# Patient Record
Sex: Male | Born: 1960
Health system: Southern US, Community
[De-identification: ages and names within clinical notes are randomized; demographics above are authoritative.]

## PROBLEM LIST (undated history)

## (undated) DIAGNOSIS — F1410 Cocaine abuse, uncomplicated: Secondary | ICD-10-CM

## (undated) DIAGNOSIS — G8929 Other chronic pain: Secondary | ICD-10-CM

## (undated) DIAGNOSIS — K279 Peptic ulcer, site unspecified, unspecified as acute or chronic, without hemorrhage or perforation: Secondary | ICD-10-CM

## (undated) DIAGNOSIS — E876 Hypokalemia: Secondary | ICD-10-CM

## (undated) DIAGNOSIS — F17200 Nicotine dependence, unspecified, uncomplicated: Secondary | ICD-10-CM

## (undated) DIAGNOSIS — E785 Hyperlipidemia, unspecified: Secondary | ICD-10-CM

## (undated) DIAGNOSIS — I10 Essential (primary) hypertension: Secondary | ICD-10-CM

## (undated) DIAGNOSIS — D72829 Elevated white blood cell count, unspecified: Secondary | ICD-10-CM

## (undated) DIAGNOSIS — I2510 Atherosclerotic heart disease of native coronary artery without angina pectoris: Secondary | ICD-10-CM

## (undated) DIAGNOSIS — K219 Gastro-esophageal reflux disease without esophagitis: Secondary | ICD-10-CM

## (undated) DIAGNOSIS — I739 Peripheral vascular disease, unspecified: Secondary | ICD-10-CM

## (undated) DIAGNOSIS — K449 Diaphragmatic hernia without obstruction or gangrene: Secondary | ICD-10-CM

## (undated) HISTORY — DX: Hypokalemia: E87.6

## (undated) HISTORY — DX: Elevated white blood cell count, unspecified: D72.829

## (undated) HISTORY — DX: Peptic ulcer, site unspecified, unspecified as acute or chronic, without hemorrhage or perforation: K27.9

## (undated) HISTORY — DX: Other chronic pain: G89.29

## (undated) HISTORY — PX: CORONARY STENT PLACEMENT: SHX1402

## (undated) HISTORY — DX: Diaphragmatic hernia without obstruction or gangrene: K44.9

---

## 2012-09-20 ENCOUNTER — Emergency Department (HOSPITAL_COMMUNITY): Payer: Medicare Other

## 2012-09-20 ENCOUNTER — Observation Stay (HOSPITAL_COMMUNITY)
Admission: EM | Admit: 2012-09-20 | Discharge: 2012-09-24 | DRG: 287 | Disposition: A | Discharge status: Home / Self Care | Payer: Medicare Other | Attending: Internal Medicine | Admitting: Internal Medicine

## 2012-09-20 ENCOUNTER — Encounter (HOSPITAL_COMMUNITY): Payer: Self-pay | Admitting: Emergency Medicine

## 2012-09-20 DIAGNOSIS — F17200 Nicotine dependence, unspecified, uncomplicated: Secondary | ICD-10-CM

## 2012-09-20 DIAGNOSIS — E785 Hyperlipidemia, unspecified: Secondary | ICD-10-CM | POA: Diagnosis present

## 2012-09-20 DIAGNOSIS — E876 Hypokalemia: Secondary | ICD-10-CM | POA: Diagnosis present

## 2012-09-20 DIAGNOSIS — I5020 Unspecified systolic (congestive) heart failure: Secondary | ICD-10-CM | POA: Diagnosis present

## 2012-09-20 DIAGNOSIS — H5340 Unspecified visual field defects: Secondary | ICD-10-CM | POA: Diagnosis present

## 2012-09-20 DIAGNOSIS — M79609 Pain in unspecified limb: Secondary | ICD-10-CM

## 2012-09-20 DIAGNOSIS — R079 Chest pain, unspecified: Principal | ICD-10-CM | POA: Diagnosis present

## 2012-09-20 DIAGNOSIS — I10 Essential (primary) hypertension: Secondary | ICD-10-CM | POA: Diagnosis present

## 2012-09-20 DIAGNOSIS — Z9861 Coronary angioplasty status: Secondary | ICD-10-CM

## 2012-09-20 DIAGNOSIS — F1410 Cocaine abuse, uncomplicated: Secondary | ICD-10-CM

## 2012-09-20 DIAGNOSIS — H348192 Central retinal vein occlusion, unspecified eye, stable: Secondary | ICD-10-CM

## 2012-09-20 DIAGNOSIS — Z8249 Family history of ischemic heart disease and other diseases of the circulatory system: Secondary | ICD-10-CM

## 2012-09-20 DIAGNOSIS — K5900 Constipation, unspecified: Secondary | ICD-10-CM

## 2012-09-20 DIAGNOSIS — Z7982 Long term (current) use of aspirin: Secondary | ICD-10-CM

## 2012-09-20 DIAGNOSIS — I739 Peripheral vascular disease, unspecified: Secondary | ICD-10-CM

## 2012-09-20 DIAGNOSIS — H349 Unspecified retinal vascular occlusion: Secondary | ICD-10-CM | POA: Diagnosis present

## 2012-09-20 DIAGNOSIS — I2510 Atherosclerotic heart disease of native coronary artery without angina pectoris: Secondary | ICD-10-CM | POA: Diagnosis present

## 2012-09-20 DIAGNOSIS — I509 Heart failure, unspecified: Secondary | ICD-10-CM | POA: Diagnosis present

## 2012-09-20 HISTORY — DX: Atherosclerotic heart disease of native coronary artery without angina pectoris: I25.10

## 2012-09-20 HISTORY — DX: Hyperlipidemia, unspecified: E78.5

## 2012-09-20 HISTORY — DX: Nicotine dependence, unspecified, uncomplicated: F17.200

## 2012-09-20 HISTORY — DX: Peripheral vascular disease, unspecified: I73.9

## 2012-09-20 HISTORY — DX: Cocaine abuse, uncomplicated: F14.10

## 2012-09-20 HISTORY — DX: Gastro-esophageal reflux disease without esophagitis: K21.9

## 2012-09-20 HISTORY — DX: Essential (primary) hypertension: I10

## 2012-09-20 LAB — CBC WITH DIFFERENTIAL/PLATELET
Basophils Absolute: 0 10*3/uL (ref 0.0–0.1)
Basophils Relative: 1 % (ref 0–1)
Eosinophils Absolute: 0.4 10*3/uL (ref 0.0–0.7)
Eosinophils Relative: 5 % (ref 0–5)
HCT: 37.9 % — ABNORMAL LOW (ref 39.0–52.0)
MCH: 29.9 pg (ref 26.0–34.0)
MCHC: 34.6 g/dL (ref 30.0–36.0)
MCV: 86.5 fL (ref 78.0–100.0)
Monocytes Absolute: 0.5 10*3/uL (ref 0.1–1.0)
Platelets: 245 10*3/uL (ref 150–400)
RDW: 12.7 % (ref 11.5–15.5)
WBC: 8.3 10*3/uL (ref 4.0–10.5)

## 2012-09-20 LAB — URINALYSIS, ROUTINE W REFLEX MICROSCOPIC
Bilirubin Urine: NEGATIVE
Glucose, UA: NEGATIVE mg/dL
Hgb urine dipstick: NEGATIVE
Nitrite: NEGATIVE
Specific Gravity, Urine: 1.01 (ref 1.005–1.030)
pH: 7.5 (ref 5.0–8.0)

## 2012-09-20 LAB — CBC
HCT: 35.6 % — ABNORMAL LOW (ref 39.0–52.0)
MCHC: 33.7 g/dL (ref 30.0–36.0)
MCV: 86.8 fL (ref 78.0–100.0)
RDW: 12.8 % (ref 11.5–15.5)

## 2012-09-20 LAB — COMPREHENSIVE METABOLIC PANEL
ALT: 23 U/L (ref 0–53)
AST: 21 U/L (ref 0–37)
Calcium: 9.2 mg/dL (ref 8.4–10.5)
Creatinine, Ser: 0.75 mg/dL (ref 0.50–1.35)
GFR calc non Af Amer: >90 mL/min (ref >90)
Sodium: 142 mEq/L (ref 135–145)
Total Protein: 7.5 g/dL (ref 6.0–8.3)

## 2012-09-20 LAB — PROTIME-INR: INR: 1.09 (ref 0.00–1.49)

## 2012-09-20 LAB — POCT I-STAT TROPONIN I: Troponin i, poc: 0.04 ng/mL (ref 0.00–0.08)

## 2012-09-20 LAB — RAPID URINE DRUG SCREEN, HOSP PERFORMED
Cocaine: NOT DETECTED
Opiates: NOT DETECTED

## 2012-09-20 LAB — CREATININE, SERUM: GFR calc Af Amer: >90 mL/min (ref >90)

## 2012-09-20 MED ORDER — ATORVASTATIN CALCIUM 40 MG PO TABS
40.0000 mg | ORAL_TABLET | Freq: Every day | ORAL | Status: DC
  Administered 2012-09-20 – 2012-09-24 (×5): 40 mg via ORAL
  Filled 2012-09-20 (×5): qty 1

## 2012-09-20 MED ORDER — ONDANSETRON HCL 4 MG/2ML IJ SOLN
4.0000 mg | Freq: Four times a day (QID) | INTRAMUSCULAR | Status: DC | PRN
  Filled 2012-09-20: qty 2

## 2012-09-20 MED ORDER — ENOXAPARIN SODIUM 40 MG/0.4ML ~~LOC~~ SOLN
40.0000 mg | SUBCUTANEOUS | Status: DC
  Administered 2012-09-22 – 2012-09-23 (×2): 40 mg via SUBCUTANEOUS
  Filled 2012-09-20 (×5): qty 0.4

## 2012-09-20 MED ORDER — ALUM & MAG HYDROXIDE-SIMETH 200-200-20 MG/5ML PO SUSP: 30.0000 mL | Freq: Four times a day (QID) | ORAL | Status: DC | PRN

## 2012-09-20 MED ORDER — HYDROCHLOROTHIAZIDE 25 MG PO TABS
25.0000 mg | ORAL_TABLET | Freq: Every day | ORAL | Status: DC
  Administered 2012-09-20 – 2012-09-24 (×5): 25 mg via ORAL
  Filled 2012-09-20 (×5): qty 1

## 2012-09-20 MED ORDER — CARVEDILOL 12.5 MG PO TABS
12.5000 mg | ORAL_TABLET | Freq: Two times a day (BID) | ORAL | Status: DC
  Administered 2012-09-20 – 2012-09-24 (×8): 12.5 mg via ORAL
  Filled 2012-09-20 (×10): qty 1

## 2012-09-20 MED ORDER — ALBUTEROL SULFATE (5 MG/ML) 0.5% IN NEBU: 2.5000 mg | INHALATION_SOLUTION | RESPIRATORY_TRACT | Status: DC | PRN

## 2012-09-20 MED ORDER — LABETALOL HCL 5 MG/ML IV SOLN
10.0000 mg | Freq: Once | INTRAVENOUS | Status: AC
  Administered 2012-09-20: 10 mg via INTRAVENOUS
  Filled 2012-09-20: qty 4

## 2012-09-20 MED ORDER — HYDROCODONE-ACETAMINOPHEN 5-325 MG PO TABS
1.0000 | ORAL_TABLET | ORAL | Status: DC | PRN
  Administered 2012-09-20 – 2012-09-23 (×2): 1 via ORAL
  Administered 2012-09-23: 2 via ORAL
  Administered 2012-09-24: 1 via ORAL
  Filled 2012-09-20 (×2): qty 2
  Filled 2012-09-20 (×2): qty 1

## 2012-09-20 MED ORDER — ASPIRIN EC 325 MG PO TBEC
325.0000 mg | DELAYED_RELEASE_TABLET | Freq: Every day | ORAL | Status: DC
  Administered 2012-09-21 – 2012-09-22 (×2): 325 mg via ORAL
  Filled 2012-09-20 (×6): qty 1

## 2012-09-20 MED ORDER — POTASSIUM CHLORIDE CRYS ER 20 MEQ PO TBCR
40.0000 meq | EXTENDED_RELEASE_TABLET | Freq: Once | ORAL | Status: AC
  Administered 2012-09-20: 40 meq via ORAL
  Filled 2012-09-20: qty 2

## 2012-09-20 MED ORDER — CLONIDINE HCL 0.1 MG PO TABS
0.1000 mg | ORAL_TABLET | Freq: Three times a day (TID) | ORAL | Status: DC
  Administered 2012-09-20 – 2012-09-24 (×11): 0.1 mg via ORAL
  Filled 2012-09-20 (×14): qty 1

## 2012-09-20 MED ORDER — NITROGLYCERIN 2 % TD OINT
1.0000 [in_us] | TOPICAL_OINTMENT | Freq: Four times a day (QID) | TRANSDERMAL | Status: DC
  Administered 2012-09-20 – 2012-09-21 (×2): 1 [in_us] via TOPICAL
  Filled 2012-09-20 (×3): qty 30
  Filled 2012-09-20: qty 1

## 2012-09-20 MED ORDER — FLUNISOLIDE 25 MCG/ACT (0.025%) NA SOLN
2.0000 | Freq: Two times a day (BID) | NASAL | Status: DC
  Filled 2012-09-20: qty 25

## 2012-09-20 MED ORDER — REGADENOSON 0.4 MG/5ML IV SOLN
0.4000 mg | Freq: Once | INTRAVENOUS | Status: AC
  Administered 2012-09-21: 0.4 mg via INTRAVENOUS
  Filled 2012-09-20: qty 5

## 2012-09-20 MED ORDER — POLYETHYLENE GLYCOL 3350 17 G PO PACK
17.0000 g | PACK | Freq: Every day | ORAL | Status: DC | PRN
  Filled 2012-09-20: qty 1

## 2012-09-20 MED ORDER — GUAIFENESIN-DM 100-10 MG/5ML PO SYRP: 5.0000 mL | ORAL_SOLUTION | ORAL | Status: DC | PRN

## 2012-09-20 MED ORDER — LOSARTAN POTASSIUM 50 MG PO TABS
100.0000 mg | ORAL_TABLET | Freq: Every day | ORAL | Status: DC
  Administered 2012-09-20 – 2012-09-23 (×4): 100 mg via ORAL
  Administered 2012-09-24: 50 mg via ORAL
  Filled 2012-09-20 (×5): qty 2

## 2012-09-20 MED ORDER — LOSARTAN POTASSIUM-HCTZ 100-25 MG PO TABS: 1.0000 | ORAL_TABLET | Freq: Every day | ORAL | Status: DC

## 2012-09-20 MED ORDER — ONDANSETRON HCL 4 MG PO TABS
4.0000 mg | ORAL_TABLET | Freq: Four times a day (QID) | ORAL | Status: DC | PRN
  Administered 2012-09-24: 4 mg via ORAL

## 2012-09-20 MED ORDER — AMLODIPINE BESYLATE 5 MG PO TABS
5.0000 mg | ORAL_TABLET | Freq: Every day | ORAL | Status: DC
  Administered 2012-09-20 – 2012-09-23 (×4): 5 mg via ORAL
  Filled 2012-09-20 (×5): qty 1

## 2012-09-20 MED ORDER — MORPHINE SULFATE 2 MG/ML IJ SOLN: 2.0000 mg | INTRAMUSCULAR | Status: DC | PRN

## 2012-09-20 MED ORDER — SODIUM CHLORIDE 0.9 % IJ SOLN
3.0000 mL | Freq: Two times a day (BID) | INTRAMUSCULAR | Status: DC
  Administered 2012-09-20 – 2012-09-23 (×5): 3 mL via INTRAVENOUS

## 2012-09-20 MED ORDER — CLOPIDOGREL BISULFATE 75 MG PO TABS
75.0000 mg | ORAL_TABLET | Freq: Every day | ORAL | Status: DC
  Administered 2012-09-20 – 2012-09-23 (×4): 75 mg via ORAL
  Filled 2012-09-20 (×4): qty 1

## 2012-09-20 MED ORDER — FLUTICASONE PROPIONATE 50 MCG/ACT NA SUSP
2.0000 | Freq: Two times a day (BID) | NASAL | Status: DC
  Administered 2012-09-20 – 2012-09-23 (×6): 2 via NASAL
  Filled 2012-09-20 (×2): qty 16

## 2012-09-20 NOTE — H&P (Addendum)
[facility]                                                                                    Patient Demographics  Cody [name], is a 51 y.o. male  CSN: [id number]  MRN: [id number]  DOB - 1960/11/17  Admit Date - 09/20/2012  Outpatient Primary MD for the patient is DEFAULT,PROVIDER, MD   With History of -  Past Medical History  Diagnosis Date  . Coronary artery disease   . HTN (hypertension)   . Dyslipidemia 09/20/2012  . PAD (peripheral artery disease) 09/20/2012  . Smoker 09/20/2012  . Cocaine abuse 09/20/2012      Past Surgical History  Procedure Date  . Coronary stent placement     in for   Chief Complaint  Patient presents with  . Chest Pain     HPI  [name]  is a 51 y.o. male, with history of coronary artery disease, PAD status post 4 stents placed at different times all in [place] last 1 one year ago, he does not know what type of stent but has been told that he has to take Plavix for the rest of his life. Patient also has hypertension, dyslipidemia, he is an active smoker and uses cocaine intermittently. He used cocaine last about 7-10 days ago, he presents to the hospital with a one-day history of left-sided intermittent chest pain which comes by itself last for about 10-15 minutes each time, anus nonradiating and sharp, sometimes makes him short of breath, no aggravating relieving factors no other associate symptoms. Presented to the ER where his first set of troponin was negative, EKG showed some lateral lead changes, he is currently pain-free I was called to admit the patient.    Review of Systems    In addition to the HPI above, No Fever-chills, No Headache, No changes with Vision or hearing, No problems swallowing food or Liquids, As above Chest pain, Cough or Shortness of Breath, No Abdominal pain, No Nausea or Vommitting, Bowel movements are regular, No Blood in stool or Urine, No dysuria, No new skin rashes or  bruises, No new joints pains-aches, some pain behind the right knee No new weakness, tingling, numbness in any extremity, No recent weight gain or loss, No polyuria, polydypsia or polyphagia, No significant Mental Stressors.  A full 10 point Review of Systems was done, except as stated above, all other Review of Systems were negative.   Social History History  Substance Use Topics  . Smoking status:  active smoker   . Smokeless tobacco: Not on file  . Alcohol Use:      Family History Family History  Problem Relation Age of Onset  . CAD Father   . CAD Brother   . Hypertension Mother      Prior to Admission medications   Medication Sig Start Date End Date Taking? Authorizing Provider  amLODipine (NORVASC) 5 MG tablet Take 5 mg by mouth daily.   Yes Historical Provider, MD  aspirin EC 325 MG tablet Take 325 mg by mouth daily.   Yes Historical Provider, MD  atorvastatin (LIPITOR) 40 MG tablet Take 40 mg by mouth daily.  Yes Historical Provider, MD  carvedilol (COREG) 12.5 MG tablet Take 12.5 mg by mouth 2 (two) times daily.   Yes Historical Provider, MD  clopidogrel (PLAVIX) 75 MG tablet Take 75 mg by mouth daily.   Yes Historical Provider, MD  flunisolide (NASALIDE) 25 MCG/ACT (0.025%) SOLN Inhale 2 sprays into the lungs 2 (two) times daily.   Yes Historical Provider, MD  losartan-hydrochlorothiazide (HYZAAR) 100-25 MG per tablet Take 1 tablet by mouth daily.   Yes Historical Provider, MD  naproxen sodium (ANAPROX) 220 MG tablet Take 220 mg by mouth 2 (two) times daily as needed. For pain   Yes Historical Provider, MD    Allergies  Allergen Reactions  . Peanuts (Peanut Oil)     Physical Exam  Vitals  Blood pressure 171/108, pulse 68, temperature 98.9 F (37.2 C), temperature source Oral, resp. rate 10, SpO2 100.00%.   1. General middle-aged African American male lying in bed in NAD,    2. Normal affect and insight, Not Suicidal or Homicidal, Awake Alert, Oriented  X 3.  3. No F.N deficits, ALL C.Nerves Intact, Strength 5/5 all 4 extremities, Sensation intact all 4 extremities, Plantars down going.  4. Ears and Eyes appear Normal, Conjunctivae clear, PERRLA. Moist Oral Mucosa.  5. Supple Neck, No JVD, No cervical lymphadenopathy appriciated, No Carotid Bruits.  6. Symmetrical Chest wall movement, Good air movement bilaterally, CTAB.  7. RRR, No Gallops, Rubs or Murmurs, No Parasternal Heave.  8. Positive Bowel Sounds, Abdomen Soft, Non tender, No organomegaly appriciated,No rebound -guarding or rigidity.  9.  No Cyanosis, Normal Skin Turgor, No Skin Rash or Bruise.  10. Good muscle tone,  joints appear normal , no effusions, Normal ROM.  11. No Palpable Lymph Nodes in Neck or Axillae     Data Review  CBC  Lab 09/20/12 1619  WBC 8.3  HGB 13.1  HCT 37.9*  PLT 245  MCV 86.5  MCH 29.9  MCHC 34.6  RDW 12.7  LYMPHSABS 3.2  MONOABS 0.5  EOSABS 0.4  BASOSABS 0.0  BANDABS --   ------------------------------------------------------------------------------------------------------------------  Chemistries   Lab 09/20/12 1619  NA 142  K 3.4*  CL 105  CO2 27  GLUCOSE 96  BUN 9  CREATININE 0.75  CALCIUM 9.2  MG --  AST 21  ALT 23  ALKPHOS 98  BILITOT 0.3   ------------------------------------------------------------------------------------------------------------------ CrCl is unknown because there is no height on file for the current visit. ------------------------------------------------------------------------------------------------------------------ No results found for this basename: TSH,T4TOTAL,FREET3,T3FREE,THYROIDAB in the last 72 hours   Coagulation profile  Lab 09/20/12 1619  INR 1.09  PROTIME --   ------------------------------------------------------------------------------------------------------------------- No results found for this basename: DDIMER:2 in the last 72  hours -------------------------------------------------------------------------------------------------------------------  Cardiac Enzymes No results found for this basename: CK:3,CKMB:3,TROPONINI:3,MYOGLOBIN:3 in the last 168 hours ------------------------------------------------------------------------------------------------------------------ No components found with this basename: POCBNP:3   ---------------------------------------------------------------------------------------------------------------  Urinalysis No results found for this basename: colorurine,  appearanceur,  labspec,  phurine,  glucoseu,  hgbur,  bilirubinur,  ketonesur,  proteinur,  urobilinogen,  nitrite,  leukocytesur    ----------------------------------------------------------------------------------------------------------------  Imaging results:   Dg Chest 2 View  09/20/2012  *RADIOLOGY REPORT*  Clinical Data: Chest pain.  History of hypertension and smoking.  CHEST - 2 VIEW  Comparison: None.  Findings: The heart size and mediastinal contours are normal. Coronary artery stents are noted.  The lungs are clear.  There is no pleural effusion or pneumothorax.  The osseous structures appear normal.  IMPRESSION: No active cardiopulmonary process.  Original Report Authenticated By: Carey Bullocks, M.D.     My personal review of EKG: Rhythm NSR, Rate  63 /min, flipped T waves in lateral leads    Assessment & Plan   1. Chest pain in a patient with significant coronary artery disease status post 4 stents in the past - patient is currently chest pain-free, he will be kept for 23 hour observation, troponins will be cycled, will place him on nitro paste, continue his aspirin Plavix along with his beta blocker and statin, has been counseled to quit smoking and cocaine, will check a urine drug screen, since he has some EKG changes will order an echogram to evaluate for wall motion and numb allergies, cardiologist Dr.  Patty Sermons will see the patient shortly she has been consulted.   2. History of smoking and cocaine abuse. Counseled to quit both. We'll get a urine drug screen, last use was 7 to 10 days ago, he has been taking his home dose Coreg without any discomfort which I will continue for now, if urine drug screen is positive we'll discontinue Coreg.   3. Hypertension in poor control. Home medications will be continued, will add Catapres with written parameters for better control.    4. Some discomfort behind the right knee, patient says he feels a knot behind his right knee, in my exam I could not palpate any not, will get ultrasound of right leg to rule out a clot.   5. History of dyslipidemia. Continue home dose statin.    6. Hypokalemia. Replace potassium and repeat BMP in the morning along with magnesium levels.    DVT Prophylaxis Lovenox    AM Labs Ordered, also please review Full Orders  Family Communication: Admission, patients condition and plan of care including tests being ordered have been discussed with the patient and   who indicates understanding and agree with the plan and Code Status.  Code Status full  Disposition Plan: home  Time spent in minutes : 35  Condition Marinell Blight K M.D on 09/20/2012 at 6:29 PM  Between 7am to 7pm - Pager - [phone]  After 7pm go to [email or web address] - password [email or web address]  And look for the night coverage person covering me after hours  [facility] Office  [phone]

## 2012-09-20 NOTE — ED Provider Notes (Signed)
History     CSN: [id number]  Arrival date & time 09/20/12  1413   First MD Initiated Contact with Patient 09/20/12 1550      Chief Complaint  Patient presents with  . Chest Pain     HPI  The patient presents with concerns of chest pain.  He states that he has a long history of intermittent chest pain.  He has a history of prior cardiac interventions, including possibly 4-5 stents. This episode began yesterday, subacutely.  Since onset he has had more severe amount more frequent chest pain.  The pain is anterior, sharp, nonradiating, neither exertion nor pleuritic.  There is no concurrent lightheadedness, nausea, vomiting, confusion or disorientation. No clear alleviating or exacerbating factors.  The patient received nitroglycerin en route.  He took aspirin at home. The patient also complains of ongoing pain in his right inguinal crease, where he had catheterization one year ago.  Just complains of pain in his right foot.  Past Medical History  Diagnosis Date  . Coronary artery disease     Past Surgical History  Procedure Date  . Coronary stent placement     No family history on file.  History  Substance Use Topics  . Smoking status: Not on file  . Smokeless tobacco: Not on file  . Alcohol Use:       Review of Systems  Constitutional:       Per HPI, otherwise negative  HENT:       Per HPI, otherwise negative  Eyes: Negative.   Respiratory:       Per HPI, otherwise negative  Cardiovascular:       Per HPI, otherwise negative  Gastrointestinal: Negative for vomiting.  Genitourinary: Negative.   Musculoskeletal:       Per HPI, otherwise negative  Skin: Negative.   Neurological: Negative for syncope.    Allergies  Peanuts  Home Medications  No current outpatient prescriptions on file.  BP 171/108  Pulse 68  Temp 98.9 F (37.2 C) (Oral)  Resp 10  SpO2 100%  Physical Exam  Nursing note and vitals reviewed. Constitutional: He is oriented to person,  place, and time. He appears well-developed. No distress.  HENT:  Head: Normocephalic and atraumatic.  Eyes: Conjunctivae normal and EOM are normal.  Cardiovascular: Normal rate and regular rhythm.   Pulmonary/Chest: Effort normal. No stridor. No respiratory distress.  Abdominal: He exhibits no distension.  Musculoskeletal: He exhibits no edema.       Right ankle: Normal.       Arms:      No notable deformity of the right ankle or foot.  Range of motion is appropriate, pulses and cap refill are appropriate.  Neurological: He is alert and oriented to person, place, and time.  Skin: Skin is warm and dry.  Psychiatric: He has a normal mood and affect.    ED Course  Procedures (including critical care time)   Labs Reviewed  CBC WITH DIFFERENTIAL  COMPREHENSIVE METABOLIC PANEL  PROTIME-INR   Dg Chest 2 View  09/20/2012  *RADIOLOGY REPORT*  Clinical Data: Chest pain.  History of hypertension and smoking.  CHEST - 2 VIEW  Comparison: None.  Findings: The heart size and mediastinal contours are normal. Coronary artery stents are noted.  The lungs are clear.  There is no pleural effusion or pneumothorax.  The osseous structures appear normal.  IMPRESSION: No active cardiopulmonary process.   Original Report Authenticated By: Carey Bullocks, M.D.  No diagnosis found.  O2 99% room air normal  Cardiac: 71 sr, normal   Date: 09/20/2012  Rate: 63  Rhythm: normal sinus rhythm  QRS Axis: normal  Intervals: normal  ST/T Wave abnormalities: nonspecific T wave changes  Conduction Disutrbances:none  Narrative Interpretation:   Old EKG Reviewed: none available ABNORMAL    MDM  This patient presents with concerns of chest pain.  Throughout the majority of his ED stay he is chest pain-free.  However, given his prior coronary stenting, and his persistent hypertension, history of labetalol.  He previous he took aspirin.  The patient's ECG shows T-wave inversions laterally.  Is on if  these are new changes.  She was admitted to the hospitalist service, after discussed his care with her cardiologist, who will consult on his case.      Gerhard Munch, MD 09/20/12 [phone]

## 2012-09-20 NOTE — ED Notes (Signed)
Patient says he has been having chest pain since last night.  Patient says his blood pressure has been high since last night.  The reason he called the ambulance today is because his blood pressure got so high he started having chest pain so he called.  911.  Patient said he took 325mg  of Aspirin prior to the Ambulance arrival.  Patient said he does not take Nitroglycerin because he gets a headache and it does not go away.

## 2012-09-20 NOTE — Progress Notes (Signed)
*  Preliminary Results* Right lower extremity venous duplex completed. Right lower extremity is negative for deep vein thrombosis. No evidence of right Baker's cyst. Preliminary results discussed with Doran Heater, RN.  09/20/2012 7:47 PM Gertie Fey, RDMS, RDCS

## 2012-09-20 NOTE — Consult Note (Signed)
CONSULT NOTE  Date: 09/20/2012               Patient Name:  Cody [name] MRN: [id number]  DOB: 05/07/1961 Age / Sex: 51 y.o., male        PCP: DEFAULT,PROVIDER Primary Cardiologist: New to [facility]            Referring Physician: Susa Raring, MD              Reason for Consult: Chest pain           History of Present Illness: Patient is a 51 y.o. male with a PMHx of CAD, HTN, cocaine abuse, PUD , who was admitted to [facility] on 09/20/2012 for evaluation of chest pain.   Pt has a hx of CAD - s/p 4 previous coronary stents ( in the RCA)  while he was up in [place], [place].   He presents tonight with sharp, stabbing chest pain.  The pain occurs at rest .  The pain is off and on - lasts about 10.  He keeps NTG with him but has not taken any ( gives him a headache).  He does not have exertional cp.   He has a hx of stenting 2001, 2008, 2011.  Exact records are not available. He has had a reaction to IV contrast during a CT But has tolerated cardiac caths OK.  It was difficult for the patient to stay on 1 particular subject during the interview.  We jumped from chest pain, leg pain, eye pain, headache ( presumably from the NTG paste)  Medications: Outpatient medications:  (Not in a hospital admission)  Current medications: Current Facility-Administered Medications  Medication Dose Route Frequency Provider Last Rate Last Dose  . cloNIDine (CATAPRES) tablet 0.1 mg  0.1 mg Oral TID Leroy Sea, MD   0.1 mg at 09/20/12 1830  . nitroGLYCERIN (NITROGLYN) 2 % ointment 1 inch  1 inch Topical Q6H Leroy Sea, MD   1 inch at 09/20/12 1830   Current Outpatient Prescriptions  Medication Sig Dispense Refill  . amLODipine (NORVASC) 5 MG tablet Take 5 mg by mouth daily.      Marland Kitchen aspirin EC 325 MG tablet Take 325 mg by mouth daily.      Marland Kitchen atorvastatin (LIPITOR) 40 MG tablet Take 40 mg by mouth daily.      . carvedilol (COREG) 12.5 MG tablet Take 12.5 mg by mouth 2 (two) times daily.       . clopidogrel (PLAVIX) 75 MG tablet Take 75 mg by mouth daily.      . flunisolide (NASALIDE) 25 MCG/ACT (0.025%) SOLN Inhale 2 sprays into the lungs 2 (two) times daily.      Marland Kitchen losartan-hydrochlorothiazide (HYZAAR) 100-25 MG per tablet Take 1 tablet by mouth daily.      . naproxen sodium (ANAPROX) 220 MG tablet Take 220 mg by mouth 2 (two) times daily as needed. For pain         Allergies  Allergen Reactions  . Peanuts (Peanut Oil)      Past Medical History  Diagnosis Date  . Coronary artery disease   . HTN (hypertension)   . Dyslipidemia 09/20/2012  . PAD (peripheral artery disease) 09/20/2012  . Smoker 09/20/2012  . Cocaine abuse 09/20/2012    Past Surgical History  Procedure Date  . Coronary stent placement     Family History  Problem Relation Age of Onset  . CAD Father   . CAD Brother   .  Hypertension Mother     Social History:  does not have a smoking history on file. He does not have any smokeless tobacco history on file. His alcohol and drug histories not on file.   Review of Systems: Constitutional:  denies fever, chills, diaphoresis, appetite change and fatigue.  HEENT: admits to  eye pain,   Respiratory: admits to SOB, DOE, cough, chest tightness, and wheezing.  Cardiovascular: admits to chest pain, palpitations and leg swelling.  Gastrointestinal: denies nausea, vomiting, abdominal pain, diarrhea, constipation, blood in stool.  Genitourinary: denies dysuria, urgency, frequency, hematuria, flank pain and difficulty urinating.  Musculoskeletal: denies  myalgias, back pain, joint swelling, arthralgias and gait problem.   Skin: denies pallor, rash and wound.  Neurological: denies dizziness, seizures, syncope, weakness, light-headedness, numbness and headaches.   Hematological: denies adenopathy, easy bruising, personal or family bleeding history.  Psychiatric/ Behavioral: denies suicidal ideation, mood changes, confusion, nervousness, sleep disturbance and  agitation.    Physical Exam: BP 171/104  Pulse 66  Temp 98.9 F (37.2 C) (Oral)  Resp 16  SpO2 100%  General: Vital signs reviewed and noted. Patient is in NAD.  At his request, I wiped off the NTG paste ( severe headache)  Head: Normocephalic, atraumatic, sclera anicteric, mucus membranes are moist  Neck: Supple. Negative for carotid bruits. JVD not elevated.  Lungs:  Clear bilaterally to auscultation without wheezes, rales, or rhonchi. Breathing is unlabored.  Heart: RRR with S1 S2.   Abdomen:  Soft, non-tender, non-distended with normoactive bowel sounds. No hepatomegaly. No rebound/guarding. No obvious abdominal masses  MSK: Strength and the appear normal for age.  Extremities: No clubbing or cyanosis. No edema.  Distal pedal pulses are 2+ and equal bilaterally.  Neurologic: Alert and oriented X 3. Moves all extremities spontaneously.  Psych: Responds to questions appropriately with a normal affect.    Lab results: Basic Metabolic Panel:  Lab 09/20/12 1610  NA 142  K 3.4*  CL 105  CO2 27  GLUCOSE 96  BUN 9  CREATININE 0.75  CALCIUM 9.2  MG --  PHOS --    Liver Function Tests:  Lab 09/20/12 1619  AST 21  ALT 23  ALKPHOS 98  BILITOT 0.3  PROT 7.5  ALBUMIN 4.2   No results found for this basename: LIPASE:3,AMYLASE:3 in the last 168 hours No results found for this basename: AMMONIA:3 in the last 168 hours  CBC:  Lab 09/20/12 1619  WBC 8.3  NEUTROABS 4.2  HGB 13.1  HCT 37.9*  MCV 86.5  PLT 245    Cardiac Enzymes: No results found for this basename: CKTOTAL:5,CKMB:5,CKMBINDEX:5,TROPONINI:5 in the last 168 hours  BNP: No components found with this basename: POCBNP:3  CBG: No results found for this basename: GLUCAP:5 in the last 168 hours  Coagulation Studies:  Basename 09/20/12 1619  LABPROT 14.0  INR 1.09     Other results:  EKG : NSR , TWI laterally ( no old ECGs to compare)   Imaging: Dg Chest 2 View  09/20/2012  *RADIOLOGY  REPORT*  Clinical Data: Chest pain.  History of hypertension and smoking.  CHEST - 2 VIEW  Comparison: None.  Findings: The heart size and mediastinal contours are normal. Coronary artery stents are noted.  The lungs are clear.  There is no pleural effusion or pneumothorax.  The osseous structures appear normal.  IMPRESSION: No active cardiopulmonary process.   Original Report Authenticated By: Carey Bullocks, M.D.        Assessment & Plan:  Coronary artery disease () He has a hx of CAD.  His pain tonight sounds atypical and not necessarily anginal.   Agree with collecting enzymes.  He apparently has 4 stents in his RCA and has moderate disease in his LAD.  It was difficult to ascertain whether or not these episodes of CP are anginal.  They are different from his previous pains.  Will schedule Lexiscan myoview for am.  HTN (hypertension) (09/20/2012) He has been taking his medications.  He used cocaine last week.  Would titrate his Coreg and Norvasc up as needed.   Smoker (09/20/2012) He needs to stop smoking  PAD (peripheral artery disease) (09/20/2012)   The handwritten note from his previous doctor indicates that he has some PVD.   He has 2+ pulses on my exam.   He may need ABI.   Cocaine abuse (09/20/2012)  He admits to using cocaine 7-10 days ago.  UDS negative.   Dyslipidemia (09/20/2012)  continue atorvastatin  DVT PPX -   Vesta Mixer, Montez Hageman., MD, [facility] 09/20/2012, 7:21 PM

## 2012-09-20 NOTE — ED Notes (Signed)
Cp sharpre non radiating to chest x 2 hours ago took 324 asa today he refused nitro from ems and his own  hxof stets

## 2012-09-20 NOTE — ED Notes (Signed)
AIDET performed. 

## 2012-09-21 ENCOUNTER — Observation Stay (HOSPITAL_COMMUNITY): Payer: Medicare Other

## 2012-09-21 DIAGNOSIS — R079 Chest pain, unspecified: Secondary | ICD-10-CM

## 2012-09-21 DIAGNOSIS — E785 Hyperlipidemia, unspecified: Secondary | ICD-10-CM

## 2012-09-21 DIAGNOSIS — H5340 Unspecified visual field defects: Secondary | ICD-10-CM | POA: Diagnosis present

## 2012-09-21 HISTORY — DX: Unspecified visual field defects: H53.40

## 2012-09-21 LAB — BASIC METABOLIC PANEL
CO2: 25 mEq/L (ref 19–32)
Calcium: 8.8 mg/dL (ref 8.4–10.5)
Glucose, Bld: 126 mg/dL — ABNORMAL HIGH (ref 70–99)
Sodium: 137 mEq/L (ref 135–145)

## 2012-09-21 LAB — MAGNESIUM: Magnesium: 2.1 mg/dL (ref 1.5–2.5)

## 2012-09-21 LAB — CBC
MCH: 29.4 pg (ref 26.0–34.0)
MCV: 87.8 fL (ref 78.0–100.0)
Platelets: 233 10*3/uL (ref 150–400)
RBC: 4.19 MIL/uL — ABNORMAL LOW (ref 4.22–5.81)

## 2012-09-21 MED ORDER — TECHNETIUM TC 99M SESTAMIBI GENERIC - CARDIOLITE
30.0000 | Freq: Once | INTRAVENOUS | Status: AC | PRN
  Administered 2012-09-21: 30 via INTRAVENOUS

## 2012-09-21 MED ORDER — POLYVINYL ALCOHOL 1.4 % OP SOLN
1.0000 [drp] | OPHTHALMIC | Status: DC | PRN
  Filled 2012-09-21: qty 15

## 2012-09-21 MED ORDER — REGADENOSON 0.4 MG/5ML IV SOLN
INTRAVENOUS | Status: AC
  Filled 2012-09-21: qty 5

## 2012-09-21 MED ORDER — TECHNETIUM TC 99M SESTAMIBI GENERIC - CARDIOLITE
10.0000 | Freq: Once | INTRAVENOUS | Status: AC | PRN
  Administered 2012-09-21: 10 via INTRAVENOUS

## 2012-09-21 NOTE — Progress Notes (Signed)
[facility] PROGRESS NOTE  Cody [name] ZOX:[id number] DOB: 1960-12-11 DOA: 09/20/2012 PCP: Sheila Oats, MD  Brief narrative: Cody [name] is a 51 y.o. male, with history of coronary artery disease, PAD status post 4 stents placed at different times all in [place] last 1 one year ago, he does not know what type of stent but has been told that he has to take Plavix for the rest of his life. Patient also has hypertension, dyslipidemia, he is an active smoker and uses cocaine intermittently. He used cocaine last about 7-10 days ago, he presents to the hospital with a one-day history of left-sided intermittent chest pain which comes by itself last for about 10-15 minutes each time, anus nonradiating and sharp, sometimes makes him short of breath, no aggravating relieving factors no other associate symptoms. Presented to the ER where his first set of troponin was negative, EKG showed some lateral lead changes. Pt also has various other complaints: - pain in right inguinal crease when he had a cath last yr- per ER doc note-  - pain in right foot - also per ER note - pain behind right knee- Venous duplex ordered by Dr Thedore Mins- negative  Assessment/Plan: Active Problems:  Coronary artery disease S/p stenting Cardiac enzymes negative, myoview negative, Appreciate Dr Sallee Provencal assistance- has nothing further to add- pt may return to his previuos cardiologist- states he will only been in [facility] for 2 more months.   Systolic CHF-  Per Myoview- see Dr Sallee Provencal note- EF 44 %- f/u on ECHO- Pt already onh ARB   HTN (hypertension) Cont home meds- Clonidine added   Smoker Advised to quit   PAD (peripheral artery disease) Cont ASA and Plavix   Cocaine abuse Advised to quite- pt also on B Blokcer- will need to review dangers of cocaine use in setting of B blocker prior to d/c  Visual complaints/ hemionopia Yesterday only complained of itching in eyes- overnight complained of "dark  blurry vision" in medial half when looking out of his left eye- MRI negative- if persistent tomorrow- will need to consult with opthalmology.     Code Status: full code  Family Communication: none Disposition Plan:  Transfer to med/surg DVT prophylaxis: lovenox   Consultants:  cardiology  Procedures:  myoview today  Antibiotics:  none  HPI/Subjective: No complaints of any pain other than b/l eye pain- Pt continues to complain of loss of vision - when covering his right eye and using his left, he is not able to see the left half of my face. He c/o pain in both eyes for days- no tearing or itching. Vision is blurry when looking out of of right eye but no visual field defects.   Objective: Filed Vitals:   09/21/12 1700 09/21/12 1721 09/21/12 1955 09/21/12 1958  BP:  146/92 133/64   Pulse:      Temp: 98.3 F (36.8 C)   98 F (36.7 C)  TempSrc:    Oral  Resp:      Height:      Weight:      SpO2:    94%    Intake/Output Summary (Last 24 hours) at 09/21/12 2041 Last data filed at 09/20/12 2300  Gross per 24 hour  Intake    240 ml  Output      0 ml  Net    240 ml    Exam:   General:  Alert, no distress  Cardiovascular: RRR, no murmurs  Respiratory: CTA b/l   Abdomen: soft, NT,  ND< BS+  Ext: no c/c/e  Data Reviewed: Basic Metabolic Panel:  Lab 09/21/12 1610 09/20/12 2201 09/20/12 1619  NA 137 -- 142  K 4.1 -- 3.4*  CL 100 -- 105  CO2 25 -- 27  GLUCOSE 126* -- 96  BUN 11 -- 9  CREATININE 0.84 0.83 0.75  CALCIUM 8.8 -- 9.2  MG 2.1 -- --  PHOS -- -- --   Liver Function Tests:  Lab 09/20/12 1619  AST 21  ALT 23  ALKPHOS 98  BILITOT 0.3  PROT 7.5  ALBUMIN 4.2   No results found for this basename: LIPASE:5,AMYLASE:5 in the last 168 hours No results found for this basename: AMMONIA:5 in the last 168 hours CBC:  Lab 09/21/12 0222 09/20/12 2201 09/20/12 1619  WBC 7.7 8.2 8.3  NEUTROABS -- -- 4.2  HGB 12.3* 12.0* 13.1  HCT 36.8* 35.6* 37.9*   MCV 87.8 86.8 86.5  PLT 233 233 245   Cardiac Enzymes:  Lab 09/21/12 1240 09/21/12 0222 09/20/12 2201  CKTOTAL -- -- --  CKMB -- -- --  CKMBINDEX -- -- --  TROPONINI <0.30 <0.30 <0.30   BNP (last 3 results) No results found for this basename: PROBNP:3 in the last 8760 hours CBG: No results found for this basename: GLUCAP:5 in the last 168 hours  Recent Results (from the past 240 hour(s))  MRSA PCR SCREENING     Status: Normal   Collection Time   09/20/12  9:31 PM      Component Value Range Status Comment   MRSA by PCR NEGATIVE  NEGATIVE Final      Studies: Dg Chest 2 View  09/20/2012  *RADIOLOGY REPORT*  Clinical Data: Chest pain.  History of hypertension and smoking.  CHEST - 2 VIEW  Comparison: None.  Findings: The heart size and mediastinal contours are normal. Coronary artery stents are noted.  The lungs are clear.  There is no pleural effusion or pneumothorax.  The osseous structures appear normal.  IMPRESSION: No active cardiopulmonary process.   Original Report Authenticated By: Carey Bullocks, M.D.    Mr Brain Wo Contrast  09/21/2012  *RADIOLOGY REPORT*  Clinical Data: Visual changes medial aspect left eye. Right lower leg weakness.  Hypertensive smoker with history of cocaine induced. Dyslipidemia.  MRI HEAD WITHOUT CONTRAST  Technique:  Multiplanar, multiecho pulse sequences of the brain and surrounding structures were obtained according to standard protocol without intravenous contrast.  Comparison: None.  Findings: No acute infarct.  No intracranial hemorrhage.  Prominent nonspecific white matter type changes.  Given the patient's history, white matter type changes are suggestive of result of a small vessel disease although other causes such as that secondary to; vasculitis, inflammatory process or demyelinating process not entirely excluded.  No intracranial mass lesion detected on this unenhanced exam.  No hydrocephalus.  Major intracranial vascular structures are  patent.  Polypoid opacification inferior aspect left maxillary sinus.  Mild mucosal thickening inferior right maxillary sinus.  Mild exophthalmos.  IMPRESSION:  No acute infarct.  No intracranial hemorrhage.  Prominent nonspecific white matter type changes.  Mild exophthalmos.  Please see above.   Original Report Authenticated By: Lacy Duverney, M.D.    Nm Myocar Multi W/spect W/wall Motion / Ef  09/21/2012  *RADIOLOGY REPORT*  Clinical Data:  Chest pain.  MYOCARDIAL IMAGING WITH SPECT (REST AND PHARMACOLOGIC-STRESS) GATED LEFT VENTRICULAR WALL MOTION STUDY LEFT VENTRICULAR EJECTION FRACTION  Technique:  Standard myocardial SPECT imaging was performed after resting intravenous injection of 10 mCi  Tc-40m sestamibi. Subsequently, intravenous infusion of Lexiscan was performed under the supervision of the Cardiology staff.  At peak effect of the drug, 30 mCi Tc-73m sestamibi was injected intravenously and standard myocardial SPECT  imaging was performed.  Quantitative gated imaging was also performed to evaluate left ventricular wall motion, and estimate left ventricular ejection fraction.  Comparison:  None.  Findings: The SPECT stress and rest images demonstrate no fixed or reversible defects to suggest infarction or ischemia.  There is mild global hypokinesis on the gated SPECT images with a calculated ejection fraction of 44%.  IMPRESSION:  1.  No findings for infarction or ischemia. 2.  Mild global hypokinesis with estimated ejection fraction of 44%.   Original Report Authenticated By: Rudie Meyer, M.D.     Scheduled Meds:   . amLODipine  5 mg Oral Daily  . aspirin EC  325 mg Oral Daily  . atorvastatin  40 mg Oral Daily  . carvedilol  12.5 mg Oral BID  . cloNIDine  0.1 mg Oral TID  . clopidogrel  75 mg Oral Daily  . enoxaparin (LOVENOX) injection  40 mg Subcutaneous Q24H  . fluticasone  2 spray Each Nare BID  . hydrochlorothiazide  25 mg Oral Daily  . losartan  100 mg Oral Daily  . nitroGLYCERIN   1 inch Topical Q6H  . sodium chloride  3 mL Intravenous Q12H   Continuous Infusions:   ________________________________________________________________________  Time spent: 40 min    [facility]  [facility] Pager [phone] If 8PM-8AM, please contact night-coverage at [email or web address], password [facility] 09/21/2012, 8:41 PM  LOS: 1 day

## 2012-09-21 NOTE — Progress Notes (Addendum)
PROGRESS NOTE  Subjective:   Cody [name] is a 51 yo with hx of CAD ( stents placed in [facility] in [place]).  Presented to ED last night with aCP.  Troponin levels are negative.    Myoview study is pending.  Pt is very demanding.  He insists on getting a cath - no matter what the myoview shows. He  Has asked to see an eye doctor, dentist. He complains of feet pain, eye pain, headache, constant chest pain.  Objective    Vital Signs:   Temp:  [97.5 F (36.4 C)-98.9 F (37.2 C)] 98.1 F (36.7 C) (12/17 1202) Pulse Rate:  [63-86] 66  (12/16 2300) Resp:  [10-25] 20  (12/17 1202) BP: (138-191)/(80-139) 180/103 mmHg (12/17 1040) SpO2:  [94 %-100 %] 99 % (12/17 1202) Weight:  [183 lb 3.2 oz (83.1 kg)] 183 lb 3.2 oz (83.1 kg) (12/16 2136)      24-hour weight change: Weight change:   Weight trends: Filed Weights   09/20/12 2136  Weight: 183 lb 3.2 oz (83.1 kg)    Intake/Output:  12/16 0701 - 12/17 0700 In: 240 [P.O.:240] Out: 200 [Urine:200]     Physical Exam: BP 180/103  Pulse 66  Temp 98.1 F (36.7 C) (Oral)  Resp 20  Ht 5\' 6"  (1.676 m)  Wt 183 lb 3.2 oz (83.1 kg)  BMI 29.57 kg/m2  SpO2 99%  General: Vital signs reviewed and noted.   Head: Normocephalic, atraumatic.  Eyes: conjunctivae/corneas clear.  EOM's intact.   Throat: normal  Neck:  normal  Lungs:    clear  Heart:  RR   Abdomen:  Soft, non-tender, non-distended    Extremities: No edema, good pulses   Neurologic: A&O X3, CN II - XII are grossly intact.   Psych: Normal     Labs: BMET:  Basename 09/21/12 0222 09/20/12 2201 09/20/12 1619  NA 137 -- 142  K 4.1 -- 3.4*  CL 100 -- 105  CO2 25 -- 27  GLUCOSE 126* -- 96  BUN 11 -- 9  CREATININE 0.84 0.83 --  CALCIUM 8.8 -- 9.2  MG 2.1 -- --  PHOS -- -- --    Liver function tests:  [facility] 09/20/12 1619  AST 21  ALT 23  ALKPHOS 98  BILITOT 0.3  PROT 7.5  ALBUMIN 4.2   No results found for this basename: LIPASE:2,AMYLASE:2 in the last 72  hours  CBC:  Basename 09/21/12 0222 09/20/12 2201 09/20/12 1619  WBC 7.7 8.2 --  NEUTROABS -- -- 4.2  HGB 12.3* 12.0* --  HCT 36.8* 35.6* --  MCV 87.8 86.8 --  PLT 233 233 --    Cardiac Enzymes:  Basename 09/21/12 1240 09/21/12 0222 09/20/12 2201  CKTOTAL -- -- --  CKMB -- -- --  TROPONINI <0.30 <0.30 <0.30    Coagulation Studies:  Basename 09/20/12 1619  LABPROT 14.0  INR 1.09     Basename 09/20/12 2201  TSH 1.484  T4TOTAL --  T3FREE --  THYROIDAB --   No results found for this basename: VITAMINB12,FOLATE,FERRITIN,TIBC,IRON,RETICCTPCT in the last 72 hours   Other results:  EKG :   Medications:    Infusions:    Scheduled Medications:    . amLODipine  5 mg Oral Daily  . aspirin EC  325 mg Oral Daily  . atorvastatin  40 mg Oral Daily  . carvedilol  12.5 mg Oral BID  . cloNIDine  0.1 mg Oral TID  . clopidogrel  75 mg Oral Daily  .  enoxaparin (LOVENOX) injection  40 mg Subcutaneous Q24H  . fluticasone  2 spray Each Nare BID  . hydrochlorothiazide  25 mg Oral Daily  . losartan  100 mg Oral Daily  . nitroGLYCERIN  1 inch Topical Q6H  . regadenoson      . sodium chloride  3 mL Intravenous Q12H    Assessment/ Plan:    Coronary artery disease () Awaiting results of myoview.  If the myoview is negative, he may transfer to a regular hospital bed  HTN (hypertension) (09/20/2012) BP is OK  Smoker (09/20/2012) I have advised him to stop smoking  PAD (peripheral artery disease) (09/20/2012)  he has good pulses.  I do not see any evidence of significant ischemia.  Cocaine abuse (09/20/2012)  I have warned him that he is at great risk for serious complications if he continues to use cocaine.  Dyslipidemia (09/20/2012)   Disposition:  Length of Stay: 1  Vesta Mixer, Montez Hageman., MD, [facility] 09/21/2012, 2:06 PM Office [phone] Pager [phone]  Addendum:  The myoview shows no evidence of ischemia.  His LV EF was calculated to be 44%.  I think his  actual EF is actually higher.  This reduction of EF is likely due to his long standing HTN.  His cocaine abuse may have contributed to this.    His chest pain is noncardiac.  He has no ECG abnormalities.  His ECG shows no ischemia - just repol abnormalities.  The myoview shows no ischemia.   He needs continued aggressive treatment with his BP meds.  He should follow up with a general medical doctor.  We will sign off.  Call for questions.   Vesta Mixer, Montez Hageman., MD, [facility] 09/21/2012, 3:50 PM Office - [phone] Pager [phone]

## 2012-09-21 NOTE — Progress Notes (Signed)
Event: RN paged NP 2/2 pt c/o eye pain and disruption of part of visual field. Pt here for HTN, chest pain. NP to bedside. S: Pt says he has had eye pain for about 2 weeks. Also, noticed that his vision is dark and blurry to the medial aspect of the left eye for the same 2 weeks. Visual disturbance in medial aspect of OS only occurs when pt is looking to the right. Denies previous eye problems. No drainage, trauma.  O: Appears well. Alert and oriented. Pleasant. PERRL. Visual fields are intact. EOM intact. Sclera are slightly reddened bilaterally, but no conjunctival erythema, icterus, or drainage. No peri-orbital edema or erythema noted. Left eye chalazion noted medial aspect sclera. Does not extend into the iris. Other CN are grossly intact. Strength is 5/5 throughout. Pronator drift neg.  A/P: 1. Eye pain and visual disturbance. Likely secondary to chalazion and irritability from rubbing. Spoke to pt that he would need an outpt eye doc appt to evaluate chalazion for tx. I explained the non urgency of this issue. I do not see any neuro compromise on exam. For now, will try artificial tears.  Maren Reamer, NP [facility]

## 2012-09-21 NOTE — Progress Notes (Signed)
Utilization Review Completed.Cody [name] T12/17/2013   

## 2012-09-22 ENCOUNTER — Encounter (HOSPITAL_COMMUNITY): Payer: Self-pay | Admitting: Emergency Medicine

## 2012-09-22 DIAGNOSIS — F17200 Nicotine dependence, unspecified, uncomplicated: Secondary | ICD-10-CM

## 2012-09-22 DIAGNOSIS — R072 Precordial pain: Secondary | ICD-10-CM

## 2012-09-22 DIAGNOSIS — K5900 Constipation, unspecified: Secondary | ICD-10-CM

## 2012-09-22 HISTORY — DX: Constipation, unspecified: K59.00

## 2012-09-22 LAB — URINE CULTURE
Colony Count: NO GROWTH
Culture: NO GROWTH

## 2012-09-22 LAB — SEDIMENTATION RATE: Sed Rate: 11 mm/hr (ref 0–16)

## 2012-09-22 MED ORDER — BISACODYL 10 MG RE SUPP
10.0000 mg | Freq: Once | RECTAL | Status: DC
  Filled 2012-09-22: qty 1

## 2012-09-22 MED ORDER — MAGNESIUM CITRATE PO SOLN
1.0000 | Freq: Once | ORAL | Status: AC
  Administered 2012-09-22: 1 via ORAL
  Filled 2012-09-22 (×2): qty 296

## 2012-09-22 MED ORDER — FAMOTIDINE 40 MG PO TABS
40.0000 mg | ORAL_TABLET | Freq: Two times a day (BID) | ORAL | Status: DC
  Administered 2012-09-22 – 2012-09-24 (×5): 40 mg via ORAL
  Filled 2012-09-22 (×8): qty 1

## 2012-09-22 NOTE — Care Management Note (Unsigned)
    Page 1 of 1   09/22/2012     4:03:32 PM   CARE MANAGEMENT NOTE 09/22/2012  Patient:  Cody [name], Cody [name]   Account Number:  [id number]  Date Initiated:  09/22/2012  Documentation initiated by:  Josef Tourigny  Subjective/Objective Assessment:   PT ADM ON 12/16 WITH CHEST PAIN.  PTA, PT INDEPENDENT, LIVES WITH SPOUSE.  HE IS VISITING FROM [place].     Action/Plan:   MET WITH PT TO DISCUSS DC PLANS.  PT DENIES HOME NEEDS. WILL FOLLOW.   Anticipated DC Date:  09/22/2012   Anticipated DC Plan:  HOME/SELF CARE      DC Planning Services  CM consult      Choice offered to / List presented to:             Status of service:  In process, will continue to follow Medicare Important Message given?   (If response is "NO", the following Medicare IM given date fields will be blank) Date Medicare IM given:   Date Additional Medicare IM given:    Discharge Disposition:    Per UR Regulation:  Reviewed for med. necessity/level of care/duration of stay  If discussed at Long Length of Stay Meetings, dates discussed:    Comments:

## 2012-09-22 NOTE — Progress Notes (Signed)
  Echocardiogram 2D Echocardiogram has been performed.  Cody [name] 09/22/2012, 11:15 AM

## 2012-09-22 NOTE — Progress Notes (Signed)
[facility] PROGRESS NOTE  Cody [name] ZOX:[id number] DOB: [birth year] DOA: 09/20/2012 PCP: Default, Provider, MD  Brief narrative: Cody [name] is a 51 y.o. male, with history of coronary artery disease, PAD status post 4 stents placed at different times all in [place] last 1 one year ago, he does not know what type of stent but has been told that he has to take Plavix for the rest of his life. Patient also has hypertension, dyslipidemia, he is an active smoker and uses cocaine intermittently.  He used cocaine last about 7-10 days ago, he presents to the hospital with a one-day history of left-sided intermittent chest pain which comes by itself last for about 10-15 minutes each time, anus nonradiating and sharp, sometimes makes him Cody [name] of breath, no aggravating relieving factors no other associate symptoms. Presented to the ER where his first set of troponin was negative, EKG showed some lateral lead changes.   Pt also has various other complaints: - pain in right inguinal crease when he had a cath last yr- per ER doc note - pain in right foot - also per ER note - pain behind right knee- Venous duplex ordered by Dr Thedore Mins- negative  Assessment/Plan: Active Problems:  Coronary artery disease s/p stent on plavix.  Cardiac enzymes and myoview was negative.  Patient states the he is concerned that he had a false negative stress test and is requesting a catheterization.   -  Cardiology to discuss results of stress test and address patient's desire for catheterization.    Systolic CHF- Per Myoview EF 44 % - ECHO report pending - Pt on BB, ARB, no diuretics at this time, but appears euvolemic   HTN (hypertension), blood pressures improved Cont clonidine    Smoker, Advised to quit   PAD (peripheral artery disease) Cont ASA and Plavix   Cocaine abuse Advised to quite, will need to review dangers of cocaine use in setting of B blocker prior to d/c  Visual complaints/  hemionopia "dark blurry vision" in medial half when looking out of his left eye - MRI negative -  Ophthalmology consultation -  ESR  Hx of PUD:  Start famotidine and continue maalox.  Consider escalation to PPI.  Constipation: -  Bisacodyl suppository and magnesium citrate  DIET:  Healthy heart ACCESS:  pIV IVF:  None PROPH:  lovenox  Code Status: full code  Family Communication: none Disposition Plan:  Pending further investigation of acute loss of vision   Consultants:  Cardiology  ophthalmology  Procedures:  myoview   ECHO  Antibiotics:  none  HPI/Subjective: Has 2/10 chest pain and SOB and palpitations with exertion.  Continues to have loss of vision of the medial field of the left eye.  He c/o pain in both eyes for days- no tearing or itching.  Eating and voiding normally, but no BM in several days.    Objective: Filed Vitals:   09/21/12 1958 09/21/12 2125 09/21/12 2304 09/22/12 0640  BP:  112/69 122/74 120/77  Pulse:   64 60  Temp: 98 F (36.7 C)  98.6 F (37 C) 98.4 F (36.9 C)  TempSrc: Oral  Oral Oral  Resp:      Height:      Weight:    83.1 kg (183 lb 3.2 oz)  SpO2: 94%  100% 99%    Intake/Output Summary (Last 24 hours) at 09/22/12 1258 Last data filed at 09/22/12 0900  Gross per 24 hour  Intake    360 ml  Output      0 ml  Net    360 ml    Exam:   General:  AAM, no acute distress  Cardiovascular: RRR, no murmurs  Respiratory: CTA b/l   Abdomen: soft, mildly distended, nontender.  NABS  Ext: no c/c/e  Data Reviewed: Basic Metabolic Panel:  Lab 09/21/12 1610 09/20/12 2201 09/20/12 1619  NA 137 -- 142  K 4.1 -- 3.4*  CL 100 -- 105  CO2 25 -- 27  GLUCOSE 126* -- 96  BUN 11 -- 9  CREATININE 0.84 0.83 0.75  CALCIUM 8.8 -- 9.2  MG 2.1 -- --  PHOS -- -- --   Liver Function Tests:  Lab 09/20/12 1619  AST 21  ALT 23  ALKPHOS 98  BILITOT 0.3  PROT 7.5  ALBUMIN 4.2   No results found for this basename:  LIPASE:5,AMYLASE:5 in the last 168 hours No results found for this basename: AMMONIA:5 in the last 168 hours CBC:  Lab 09/21/12 0222 09/20/12 2201 09/20/12 1619  WBC 7.7 8.2 8.3  NEUTROABS -- -- 4.2  HGB 12.3* 12.0* 13.1  HCT 36.8* 35.6* 37.9*  MCV 87.8 86.8 86.5  PLT 233 233 245   Cardiac Enzymes:  Lab 09/21/12 1240 09/21/12 0222 09/20/12 2201  CKTOTAL -- -- --  CKMB -- -- --  CKMBINDEX -- -- --  TROPONINI <0.30 <0.30 <0.30   BNP (last 3 results) No results found for this basename: PROBNP:3 in the last 8760 hours CBG: No results found for this basename: GLUCAP:5 in the last 168 hours  Recent Results (from the past 240 hour(s))  URINE CULTURE     Status: Normal   Collection Time   09/20/12  6:38 PM      Component Value Range Status Comment   Specimen Description URINE, RANDOM   Final    Special Requests NONE   Final    Culture  Setup Time 09/20/2012 20:09   Final    Colony Count NO GROWTH   Final    Culture NO GROWTH   Final    Report Status 09/22/2012 FINAL   Final   MRSA PCR SCREENING     Status: Normal   Collection Time   09/20/12  9:31 PM      Component Value Range Status Comment   MRSA by PCR NEGATIVE  NEGATIVE Final      Studies: Dg Chest 2 View  09/20/2012  *RADIOLOGY REPORT*  Clinical Data: Chest pain.  History of hypertension and smoking.  CHEST - 2 VIEW  Comparison: None.  Findings: The heart size and mediastinal contours are normal. Coronary artery stents are noted.  The lungs are clear.  There is no pleural effusion or pneumothorax.  The osseous structures appear normal.  IMPRESSION: No active cardiopulmonary process.   Original Report Authenticated By: Carey Bullocks, M.D.    Mr Brain Wo Contrast  09/21/2012  *RADIOLOGY REPORT*  Clinical Data: Visual changes medial aspect left eye. Right lower leg weakness.  Hypertensive smoker with history of cocaine induced. Dyslipidemia.  MRI HEAD WITHOUT CONTRAST  Technique:  Multiplanar, multiecho pulse sequences of  the brain and surrounding structures were obtained according to standard protocol without intravenous contrast.  Comparison: None.  Findings: No acute infarct.  No intracranial hemorrhage.  Prominent nonspecific white matter type changes.  Given the patient's history, white matter type changes are suggestive of result of a small vessel disease although other causes such as that secondary to; vasculitis, inflammatory process or demyelinating  process not entirely excluded.  No intracranial mass lesion detected on this unenhanced exam.  No hydrocephalus.  Major intracranial vascular structures are patent.  Polypoid opacification inferior aspect left maxillary sinus.  Mild mucosal thickening inferior right maxillary sinus.  Mild exophthalmos.  IMPRESSION:  No acute infarct.  No intracranial hemorrhage.  Prominent nonspecific white matter type changes.  Mild exophthalmos.  Please see above.   Original Report Authenticated By: Lacy Duverney, M.D.    Nm Myocar Multi W/spect W/wall Motion / Ef  09/21/2012  *RADIOLOGY REPORT*  Clinical Data:  Chest pain.  MYOCARDIAL IMAGING WITH SPECT (REST AND PHARMACOLOGIC-STRESS) GATED LEFT VENTRICULAR WALL MOTION STUDY LEFT VENTRICULAR EJECTION FRACTION  Technique:  Standard myocardial SPECT imaging was performed after resting intravenous injection of 10 mCi Tc-31m sestamibi. Subsequently, intravenous infusion of Lexiscan was performed under the supervision of the Cardiology staff.  At peak effect of the drug, 30 mCi Tc-34m sestamibi was injected intravenously and standard myocardial SPECT  imaging was performed.  Quantitative gated imaging was also performed to evaluate left ventricular wall motion, and estimate left ventricular ejection fraction.  Comparison:  None.  Findings: The SPECT stress and rest images demonstrate no fixed or reversible defects to suggest infarction or ischemia.  There is mild global hypokinesis on the gated SPECT images with a calculated ejection fraction of  44%.  IMPRESSION:  1.  No findings for infarction or ischemia. 2.  Mild global hypokinesis with estimated ejection fraction of 44%.   Original Report Authenticated By: Rudie Meyer, M.D.     Scheduled Meds:    . amLODipine  5 mg Oral Daily  . aspirin EC  325 mg Oral Daily  . atorvastatin  40 mg Oral Daily  . bisacodyl  10 mg Rectal Once  . carvedilol  12.5 mg Oral BID  . cloNIDine  0.1 mg Oral TID  . clopidogrel  75 mg Oral Daily  . enoxaparin (LOVENOX) injection  40 mg Subcutaneous Q24H  . famotidine  40 mg Oral BID  . fluticasone  2 spray Each Nare BID  . hydrochlorothiazide  25 mg Oral Daily  . losartan  100 mg Oral Daily  . nitroGLYCERIN  1 inch Topical Q6H  . sodium chloride  3 mL Intravenous Q12H   Continuous Infusions:   ________________________________________________________________________  Time spent: 30 min    Cody [name]  [facility] Pager [phone] If 7PM-7AM, please contact night-coverage at [email or web address], password [facility] 09/22/2012, 12:58 PM  LOS: 2 days

## 2012-09-22 NOTE — Consult Note (Signed)
Reason for Consult Vision and Visual field loss od Referring Physician:Cardiology  Cody [name] is an 50 y.o. male.  HPI: with HTN and cardiovascular DZ with complaints of acute VA  loss and visual field loss  OD,  Past Medical History  Diagnosis Date  . Coronary artery disease   . HTN (hypertension)   . Dyslipidemia 09/20/2012  . PAD (peripheral artery disease) 09/20/2012  . Smoker 09/20/2012  . Cocaine abuse 09/20/2012  . GERD (gastroesophageal reflux disease)     has stomach ulcers a while back    Past Surgical History  Procedure Date  . Coronary stent placement     Family History  Problem Relation Age of Onset  . CAD Father   . CAD Brother   . Hypertension Mother     Social History:  does not have a smoking history on file. He does not have any smokeless tobacco history on file. He reports that he drinks about 1.2 ounces of alcohol per week. His drug history not on file.  Allergies:  Allergies  Allergen Reactions  . Contrast Media (Iodinated Diagnostic Agents) Nausea And Vomiting    Vomiting blood  . Peanuts (Peanut Oil)     Medications: I have reviewed the patient's current medications.  Results for orders placed during the hospital encounter of 09/20/12 (from the past 48 hour(s))  TROPONIN I     Status: Normal   Collection Time   09/21/12  2:22 AM      Component Value Range Comment   Troponin I <0.30  <0.30 ng/mL   BASIC METABOLIC PANEL     Status: Abnormal   Collection Time   09/21/12  2:22 AM      Component Value Range Comment   Sodium 137  135 - 145 mEq/L    Potassium 4.1  3.5 - 5.1 mEq/L    Chloride 100  96 - 112 mEq/L    CO2 25  19 - 32 mEq/L    Glucose, Bld 126 (*) 70 - 99 mg/dL    BUN 11  6 - 23 mg/dL    Creatinine, Ser 1.61  0.50 - 1.35 mg/dL    Calcium 8.8  8.4 - 09.6 mg/dL    GFR calc non Af Amer >90  >90 mL/min    GFR calc Af Amer >90  >90 mL/min   CBC     Status: Abnormal   Collection Time   09/21/12  2:22 AM      Component Value  Range Comment   WBC 7.7  4.0 - 10.5 K/uL    RBC 4.19 (*) 4.22 - 5.81 MIL/uL    Hemoglobin 12.3 (*) 13.0 - 17.0 g/dL    HCT 04.5 (*) 40.9 - 52.0 %    MCV 87.8  78.0 - 100.0 fL    MCH 29.4  26.0 - 34.0 pg    MCHC 33.4  30.0 - 36.0 g/dL    RDW 81.1  91.4 - 78.2 %    Platelets 233  150 - 400 K/uL   MAGNESIUM     Status: Normal   Collection Time   09/21/12  2:22 AM      Component Value Range Comment   Magnesium 2.1  1.5 - 2.5 mg/dL   TROPONIN I     Status: Normal   Collection Time   09/21/12 12:40 PM      Component Value Range Comment   Troponin I <0.30  <0.30 ng/mL   SEDIMENTATION RATE  Status: Normal   Collection Time   09/22/12  9:44 AM      Component Value Range Comment   Sed Rate 11  0 - 16 mm/hr     Mr Brain Wo Contrast  09/21/2012  *RADIOLOGY REPORT*  Clinical Data: Visual changes medial aspect left eye. Right lower leg weakness.  Hypertensive smoker with history of cocaine induced. Dyslipidemia.  MRI HEAD WITHOUT CONTRAST  Technique:  Multiplanar, multiecho pulse sequences of the brain and surrounding structures were obtained according to standard protocol without intravenous contrast.  Comparison: None.  Findings: No acute infarct.  No intracranial hemorrhage.  Prominent nonspecific white matter type changes.  Given the patient's history, white matter type changes are suggestive of result of a small vessel disease although other causes such as that secondary to; vasculitis, inflammatory process or demyelinating process not entirely excluded.  No intracranial mass lesion detected on this unenhanced exam.  No hydrocephalus.  Major intracranial vascular structures are patent.  Polypoid opacification inferior aspect left maxillary sinus.  Mild mucosal thickening inferior right maxillary sinus.  Mild exophthalmos.  IMPRESSION:  No acute infarct.  No intracranial hemorrhage.  Prominent nonspecific white matter type changes.  Mild exophthalmos.  Please see above.   Original Report  Authenticated By: Lacy Duverney, M.D.    Nm Myocar Multi W/spect W/wall Motion / Ef  09/21/2012  *RADIOLOGY REPORT*  Clinical Data:  Chest pain.  MYOCARDIAL IMAGING WITH SPECT (REST AND PHARMACOLOGIC-STRESS) GATED LEFT VENTRICULAR WALL MOTION STUDY LEFT VENTRICULAR EJECTION FRACTION  Technique:  Standard myocardial SPECT imaging was performed after resting intravenous injection of 10 mCi Tc-52m sestamibi. Subsequently, intravenous infusion of Lexiscan was performed under the supervision of the Cardiology staff.  At peak effect of the drug, 30 mCi Tc-36m sestamibi was injected intravenously and standard myocardial SPECT  imaging was performed.  Quantitative gated imaging was also performed to evaluate left ventricular wall motion, and estimate left ventricular ejection fraction.  Comparison:  None.  Findings: The SPECT stress and rest images demonstrate no fixed or reversible defects to suggest infarction or ischemia.  There is mild global hypokinesis on the gated SPECT images with a calculated ejection fraction of 44%.  IMPRESSION:  1.  No findings for infarction or ischemia. 2.  Mild global hypokinesis with estimated ejection fraction of 44%.   Original Report Authenticated By: Rudie Meyer, M.D.     Review of Systems  Eyes: Positive for blurred vision, photophobia, pain, discharge and redness.  Cardiovascular: Positive for chest pain and palpitations.  Gastrointestinal: Positive for nausea.  Endo/Heme/Allergies: Positive for environmental allergies. Bruises/bleeds easily.  Psychiatric/Behavioral: Negative for memory loss.   Blood pressure 110/69, pulse 57, temperature 98.3 F (36.8 C), temperature source Oral, resp. rate 18, height 5\' 6"  (1.676 m), weight 83.1 kg (183 lb 3.2 oz), SpO2 100.00%. Physical Exam  Constitutional: He appears well-developed and well-nourished.  HENT:  Head: Normocephalic. Head is with contusion, with laceration, with right periorbital erythema and with left periorbital  erythema.  Mouth/Throat: Oropharynx is clear and moist.  Eyes: Conjunctivae normal and EOM are normal. Pupils are equal, round, and reactive to light. No foreign bodies found. Right eye exhibits no discharge. Scleral icterus is present.           [facility]  s RX : @Near :  20/80 :  20/60   Neck: Normal range of motion. Neck supple.  Respiratory: He is in respiratory distress. He has no wheezes.  GI: Soft. Bowel sounds are normal.  Musculoskeletal: Normal range  of motion.  Neurological: He is alert.  Skin: Skin is warm.    Assessment/Plan:                                 Branch retinal Vein occlusion od                                  2: Visual Field defect : Infranasal 2nd to superior temporal Branch retinal vein occlusion with sector retinal hemorrhages and occasional  Cotton wool spots. 3)   Pinguecula  od . 4) Presbyopia  Observation :  Schedule apt @ D/C to follow up with retinal specialist ; for possible focal laser od or possible  avastin injections ; ( Dr Fawn Kirk , Marva Panda or Stephannie Li.)  Ngoc Daughtridge A 09/22/2012, 10:12 PM

## 2012-09-23 ENCOUNTER — Encounter (HOSPITAL_COMMUNITY)
Admission: EM | Disposition: A | Discharge status: Home / Self Care | Payer: Self-pay | Source: Home / Self Care | Attending: Internal Medicine

## 2012-09-23 DIAGNOSIS — K5900 Constipation, unspecified: Secondary | ICD-10-CM

## 2012-09-23 SURGERY — LEFT HEART CATHETERIZATION WITH CORONARY ANGIOGRAM
Anesthesia: LOCAL

## 2012-09-23 MED ORDER — ASPIRIN 81 MG PO CHEW
324.0000 mg | CHEWABLE_TABLET | ORAL | Status: AC
  Administered 2012-09-24: 324 mg via ORAL

## 2012-09-23 MED ORDER — SODIUM CHLORIDE 0.9 % IJ SOLN: 3.0000 mL | Freq: Two times a day (BID) | INTRAMUSCULAR | Status: DC

## 2012-09-23 MED ORDER — CLOPIDOGREL BISULFATE 300 MG PO TABS
300.0000 mg | ORAL_TABLET | ORAL | Status: AC
Start: 2012-09-24 — End: 2012-09-24
  Administered 2012-09-24: 300 mg via ORAL
  Filled 2012-09-23 (×2): qty 1

## 2012-09-23 MED ORDER — SODIUM CHLORIDE 0.9 % IV SOLN: 250.0000 mL | INTRAVENOUS | Status: DC | PRN

## 2012-09-23 MED ORDER — SODIUM CHLORIDE 0.9 % IJ SOLN: 3.0000 mL | INTRAMUSCULAR | Status: DC | PRN

## 2012-09-23 MED ORDER — CLOPIDOGREL BISULFATE 75 MG PO TABS
75.0000 mg | ORAL_TABLET | Freq: Every day | ORAL | Status: DC
  Filled 2012-09-23: qty 1

## 2012-09-23 MED ORDER — SODIUM CHLORIDE 0.9 % IV SOLN
1.0000 mL/kg/h | INTRAVENOUS | Status: DC
  Administered 2012-09-23: 1 mL/kg/h via INTRAVENOUS

## 2012-09-23 NOTE — Progress Notes (Signed)
[facility] PROGRESS NOTE  Cody [name] ZOX:[id number] DOB: Feb 06, 1961 DOA: 09/20/2012 PCP: Default, Provider, MD  Assessment/Plan: Active Problems:   Coronary artery disease s/p multiple stents, currently on plavix.  Cardiac enzymes and myoview were negative.  Patient states the he is concerned that he had a false negative stress test and is requesting a catheterization due to ongoing chest discomfort.   -  Catheterization at 7:30AM on 12/20 scheduled by Dr. Katrinka Blazing III -  Appreciate Drs. Nahser and Katrinka Blazing III assistance  Systolic CHF- Per Myoview EF 44 %.  ECHO demonstrates EF of 55-60% with mildly dilated cavity and slight relative hypokinesis of the inferolateral wall - Pt on BB, ARB, no diuretics at this time, but appears euvolemic   HTN (hypertension), blood pressure mildly elevated today with agitation Cont clonidine at current dose and consider increasing if elevation of BP persists   Smoker, Advised to quit  PAD (peripheral artery disease): Cont ASA and Plavix   Cocaine abuse:  Advised to quite.  Visual complaints/ hemionopia.  Patient was found to have a retinal vein occlusion with some retinal hemorrhages and cotton wool spots.  He also had some presbyopia and pinguecula OD.   -  Appreciate Ophthalmology assistance -  Follow up with retinal specialist as an outpatient for possible laser ablation OD or avastin injections.  Recommend locally: Fawn Kirk , Marva Panda or Stephannie Li -  ESR was wnl  Hx of PUD:  Continue famotidine and maalox  Constipation: -  Bisacodyl suppository and magnesium citrate  DIET:  Healthy heart ACCESS:  pIV IVF:  None PROPH:  lovenox  Code Status: full code  Family Communication: none Disposition Plan:  Pending catheterization tomorrow   Consultants:  Cardiology  ophthalmology  Procedures:  myoview   ECHO  Antibiotics:  none  HPI/Subjective: Has 2/10 chest pain at baseline, but is currently agitated and states  his pain is now a 7/10.  Continues to have SOB and palpitations with exertion but has been ambulating down the halls and in his room without difficulty.   Eating and voiding normally, and had a BM after magnesium citrate.    Objective: Filed Vitals:   09/22/12 2021 09/23/12 0615 09/23/12 1045 09/23/12 1330  BP: 110/69 130/77 128/84 154/76  Pulse: 57 62 61   Temp: 98.3 F (36.8 C) 98.6 F (37 C)  98.2 F (36.8 C)  TempSrc: Oral Oral  Oral  Resp: 18 18  20   Height:      Weight:  83.1 kg (183 lb 3.2 oz)    SpO2: 100% 100%  99%    Intake/Output Summary (Last 24 hours) at 09/23/12 1419 Last data filed at 09/23/12 1300  Gross per 24 hour  Intake   1560 ml  Output      0 ml  Net   1560 ml    Exam:   General:  AAM, no acute distress,  Cardiovascular: RRR, no murmurs  Respiratory: CTA b/l   Abdomen: soft, mildly distended, nontender.  NABS  Ext: no c/c/e  Psych:   tangential and mildly pressured, perseverating on his experiences during his hospitalization, but difficulty focusing on acute issues  Data Reviewed: Basic Metabolic Panel:  Lab 09/21/12 8119 09/20/12 2201 09/20/12 1619  NA 137 -- 142  K 4.1 -- 3.4*  CL 100 -- 105  CO2 25 -- 27  GLUCOSE 126* -- 96  BUN 11 -- 9  CREATININE 0.84 0.83 0.75  CALCIUM 8.8 -- 9.2  MG 2.1 -- --  PHOS -- -- --   Liver Function Tests:  Lab 09/20/12 1619  AST 21  ALT 23  ALKPHOS 98  BILITOT 0.3  PROT 7.5  ALBUMIN 4.2   No results found for this basename: LIPASE:5,AMYLASE:5 in the last 168 hours No results found for this basename: AMMONIA:5 in the last 168 hours CBC:  Lab 09/21/12 0222 09/20/12 2201 09/20/12 1619  WBC 7.7 8.2 8.3  NEUTROABS -- -- 4.2  HGB 12.3* 12.0* 13.1  HCT 36.8* 35.6* 37.9*  MCV 87.8 86.8 86.5  PLT 233 233 245   Cardiac Enzymes:  Lab 09/21/12 1240 09/21/12 0222 09/20/12 2201  CKTOTAL -- -- --  CKMB -- -- --  CKMBINDEX -- -- --  TROPONINI <0.30 <0.30 <0.30   BNP (last 3 results) No  results found for this basename: PROBNP:3 in the last 8760 hours CBG: No results found for this basename: GLUCAP:5 in the last 168 hours  Recent Results (from the past 240 hour(s))  URINE CULTURE     Status: Normal   Collection Time   09/20/12  6:38 PM      Component Value Range Status Comment   Specimen Description URINE, RANDOM   Final    Special Requests NONE   Final    Culture  Setup Time 09/20/2012 20:09   Final    Colony Count NO GROWTH   Final    Culture NO GROWTH   Final    Report Status 09/22/2012 FINAL   Final   MRSA PCR SCREENING     Status: Normal   Collection Time   09/20/12  9:31 PM      Component Value Range Status Comment   MRSA by PCR NEGATIVE  NEGATIVE Final      Studies: Dg Chest 2 View  09/20/2012  *RADIOLOGY REPORT*  Clinical Data: Chest pain.  History of hypertension and smoking.  CHEST - 2 VIEW  Comparison: None.  Findings: The heart size and mediastinal contours are normal. Coronary artery stents are noted.  The lungs are clear.  There is no pleural effusion or pneumothorax.  The osseous structures appear normal.  IMPRESSION: No active cardiopulmonary process.   Original Report Authenticated By: Carey Bullocks, M.D.    Mr Brain Wo Contrast  09/21/2012  *RADIOLOGY REPORT*  Clinical Data: Visual changes medial aspect left eye. Right lower leg weakness.  Hypertensive smoker with history of cocaine induced. Dyslipidemia.  MRI HEAD WITHOUT CONTRAST  Technique:  Multiplanar, multiecho pulse sequences of the brain and surrounding structures were obtained according to standard protocol without intravenous contrast.  Comparison: None.  Findings: No acute infarct.  No intracranial hemorrhage.  Prominent nonspecific white matter type changes.  Given the patient's history, white matter type changes are suggestive of result of a small vessel disease although other causes such as that secondary to; vasculitis, inflammatory process or demyelinating process not entirely excluded.   No intracranial mass lesion detected on this unenhanced exam.  No hydrocephalus.  Major intracranial vascular structures are patent.  Polypoid opacification inferior aspect left maxillary sinus.  Mild mucosal thickening inferior right maxillary sinus.  Mild exophthalmos.  IMPRESSION:  No acute infarct.  No intracranial hemorrhage.  Prominent nonspecific white matter type changes.  Mild exophthalmos.  Please see above.   Original Report Authenticated By: Lacy Duverney, M.D.    Nm Myocar Multi W/spect W/wall Motion / Ef  09/21/2012  *RADIOLOGY REPORT*  Clinical Data:  Chest pain.  MYOCARDIAL IMAGING WITH SPECT (REST AND PHARMACOLOGIC-STRESS) GATED LEFT VENTRICULAR  WALL MOTION STUDY LEFT VENTRICULAR EJECTION FRACTION  Technique:  Standard myocardial SPECT imaging was performed after resting intravenous injection of 10 mCi Tc-65m sestamibi. Subsequently, intravenous infusion of Lexiscan was performed under the supervision of the Cardiology staff.  At peak effect of the drug, 30 mCi Tc-70m sestamibi was injected intravenously and standard myocardial SPECT  imaging was performed.  Quantitative gated imaging was also performed to evaluate left ventricular wall motion, and estimate left ventricular ejection fraction.  Comparison:  None.  Findings: The SPECT stress and rest images demonstrate no fixed or reversible defects to suggest infarction or ischemia.  There is mild global hypokinesis on the gated SPECT images with a calculated ejection fraction of 44%.  IMPRESSION:  1.  No findings for infarction or ischemia. 2.  Mild global hypokinesis with estimated ejection fraction of 44%.   Original Report Authenticated By: Rudie Meyer, M.D.     Scheduled Meds:    . amLODipine  5 mg Oral Daily  . aspirin EC  325 mg Oral Daily  . atorvastatin  40 mg Oral Daily  . bisacodyl  10 mg Rectal Once  . carvedilol  12.5 mg Oral BID  . cloNIDine  0.1 mg Oral TID  . clopidogrel  75 mg Oral Daily  . enoxaparin (LOVENOX)  injection  40 mg Subcutaneous Q24H  . famotidine  40 mg Oral BID  . fluticasone  2 spray Each Nare BID  . hydrochlorothiazide  25 mg Oral Daily  . losartan  100 mg Oral Daily  . nitroGLYCERIN  1 inch Topical Q6H  . sodium chloride  3 mL Intravenous Q12H   Continuous Infusions:   ________________________________________________________________________  Time spent: 30 min    Cody [name]  [facility] Pager [phone] If 7PM-7AM, please contact night-coverage at [email or web address], password [facility] 09/23/2012, 2:19 PM  LOS: 3 days

## 2012-09-23 NOTE — Consult Note (Addendum)
Admit date: 09/20/2012 Referring Physician  Dr. Malachi Bonds, [facility] Primary Physician  None Primary Cardiologist:  None Reason for Consultation:  Chest pain and numerous complaints  ASSESSMENT: 1. Chest pain, atypical. MI ruled out 2.Coronary artery disease with prior RCA BM(Integrity) stent, most recently 2011. ? restenting for ISR. Initial stent 2001. Total number of stents  3. Multiple other complaints including leg pain.  PLAN:  1. Plan diagnostic cath in AM with possible PCI. Discussed the procedure and he does not want it today. He does not want radial approach even though he complains of right leg pain. He understands th risk of stroke, MI, death, MI, CABG, bleeding, limb ischemia, kidney injury, allergy, among other complications.   2. Other medical issues per Dr. Malachi Bonds   HPI:  Chart and history reviewed with the patient. He has a complex prior history for which we have limited details. Sounds like he has had 4 caths since 2001. Last stent, Integrity 4 mm, placed in 2011. I believe some of the procedures were for ISR. His present symptoms are vague but to him similar to prior cardiac pain. The contemporary nucl;era perfusion study is unremarkable for ischemia or infarction even though the EF was 44%. Markers and ECG's have been negative.  He has prior "normal" stress tests with subsequent cath within 24 hours showing severe disease requiring stenting at [facility] in [place]. He therefore is not willing to accept the idea that his heart is okay without a cath.   PMH:   Past Medical History  Diagnosis Date  . Coronary artery disease   . HTN (hypertension)   . Dyslipidemia 09/20/2012  . PAD (peripheral artery disease) 09/20/2012  . Smoker 09/20/2012  . Cocaine abuse 09/20/2012  . GERD (gastroesophageal reflux disease)     has stomach ulcers a while back     PSH:   Past Surgical History  Procedure Date  . Coronary stent placement     Allergies:  Contrast media and Peanuts Prior to Admit  Meds:   Prescriptions prior to admission  Medication Sig Dispense Refill  . amLODipine (NORVASC) 5 MG tablet Take 5 mg by mouth daily.      Marland Kitchen aspirin EC 325 MG tablet Take 325 mg by mouth daily.      Marland Kitchen atorvastatin (LIPITOR) 40 MG tablet Take 40 mg by mouth daily.      . carvedilol (COREG) 12.5 MG tablet Take 12.5 mg by mouth 2 (two) times daily.      . clopidogrel (PLAVIX) 75 MG tablet Take 75 mg by mouth daily.      . flunisolide (NASALIDE) 25 MCG/ACT (0.025%) SOLN Inhale 2 sprays into the lungs 2 (two) times daily.      Marland Kitchen losartan-hydrochlorothiazide (HYZAAR) 100-25 MG per tablet Take 1 tablet by mouth daily.      . naproxen sodium (ANAPROX) 220 MG tablet Take 220 mg by mouth 2 (two) times daily as needed. For pain       Fam HX:    Family History  Problem Relation Age of Onset  . CAD Father   . CAD Brother   . Hypertension Mother    Social HX:    History   Social History  . Marital Status: Single    Spouse Name: N/A    Number of Children: N/A  . Years of Education: N/A   Occupational History  . Not on file.   Social History Main Topics  . Smoking status: Not on file  . Smokeless  tobacco: Not on file  . Alcohol Use: 1.2 oz/week    1-2 Shots of liquor, 2 Cans of beer per week  . Drug Use:   . Sexually Active:    Other Topics Concern  . Not on file   Social History Narrative  . No narrative on file     Review of Systems: See H and P. Right leg pain at rest with no exertional component. No swelling or joint pain.  Physical Exam: Blood pressure 128/84, pulse 61, temperature 98.6 F (37 C), temperature source Oral, resp. rate 18, height 5\' 6"  (1.676 m), weight 83.1 kg (183 lb 3.2 oz), SpO2 100.00%. Weight change: 0 kg (0 lb)   AA male in no distress but pacing around in his room.  Chest is clear to A and P  Cardiac is unremarkable and there are no murmur, clicks or rubs.  Extremities reveal no edema and there are 2 + femoral, pop, and DP pulses bilaterally. No  edema   Neuro/Psych reveals a patient who is angry, talkative, and difficult to have a conversation with due to interruptions and tendency to go tangential preventing targeted inquiry.  Labs:   Lab Results  Component Value Date   WBC 7.7 09/21/2012   HGB 12.3* 09/21/2012   HCT 36.8* 09/21/2012   MCV 87.8 09/21/2012   PLT 233 09/21/2012    Lab 09/21/12 0222 09/20/12 1619  NA 137 --  K 4.1 --  CL 100 --  CO2 25 --  BUN 11 --  CREATININE 0.84 --  CALCIUM 8.8 --  PROT -- 7.5  BILITOT -- 0.3  ALKPHOS -- 98  ALT -- 23  AST -- 21  GLUCOSE 126* --   No results found for this basename: PTT   Lab Results  Component Value Date   INR 1.09 09/20/2012   Lab Results  Component Value Date   TROPONINI <0.30 09/21/2012     Radiology:  Mr Brain Wo Contrast  09/21/2012  *RADIOLOGY REPORT*  Clinical Data: Visual changes medial aspect left eye. Right lower leg weakness.  Hypertensive smoker with history of cocaine induced. Dyslipidemia.  MRI HEAD WITHOUT CONTRAST  Technique:  Multiplanar, multiecho pulse sequences of the brain and surrounding structures were obtained according to standard protocol without intravenous contrast.  Comparison: None.  Findings: No acute infarct.  No intracranial hemorrhage.  Prominent nonspecific white matter type changes.  Given the patient's history, white matter type changes are suggestive of result of a small vessel disease although other causes such as that secondary to; vasculitis, inflammatory process or demyelinating process not entirely excluded.  No intracranial mass lesion detected on this unenhanced exam.  No hydrocephalus.  Major intracranial vascular structures are patent.  Polypoid opacification inferior aspect left maxillary sinus.  Mild mucosal thickening inferior right maxillary sinus.  Mild exophthalmos.  IMPRESSION:  No acute infarct.  No intracranial hemorrhage.  Prominent nonspecific white matter type changes.  Mild exophthalmos.  Please see  above.   Original Report Authenticated By: Lacy Duverney, M.D.    Nm Myocar Multi W/spect W/wall Motion / Ef  09/21/2012  *RADIOLOGY REPORT*  Clinical Data:  Chest pain.  MYOCARDIAL IMAGING WITH SPECT (REST AND PHARMACOLOGIC-STRESS) GATED LEFT VENTRICULAR WALL MOTION STUDY LEFT VENTRICULAR EJECTION FRACTION  Technique:  Standard myocardial SPECT imaging was performed after resting intravenous injection of 10 mCi Tc-3m sestamibi. Subsequently, intravenous infusion of Lexiscan was performed under the supervision of the Cardiology staff.  At peak effect of the drug, 30 mCi  Tc-4m sestamibi was injected intravenously and standard myocardial SPECT  imaging was performed.  Quantitative gated imaging was also performed to evaluate left ventricular wall motion, and estimate left ventricular ejection fraction.  Comparison:  None.  Findings: The SPECT stress and rest images demonstrate no fixed or reversible defects to suggest infarction or ischemia.  There is mild global hypokinesis on the gated SPECT images with a calculated ejection fraction of 44%.  IMPRESSION:  1.  No findings for infarction or ischemia. 2.  Mild global hypokinesis with estimated ejection fraction of 44%.   Original Report Authenticated By: Rudie Meyer, M.D.    EKG:   Non specific interior STTWA    Lesleigh Noe 09/23/2012 11:43 AM

## 2012-09-23 NOTE — Progress Notes (Signed)
PROGRESS NOTE  Subjective:   Benjiman is a 51 yo with hx of CAD ( stents placed in RCA in [place]).  Presented to ED with CP.  Troponin levels are negative.    Myoview study is revealed no ischemia.  EF 44%.   Echo revealed normal EF 55-60%.  ? Hypokinesis of inferior wall.  Pt is very demanding.  He insists on getting a cath - despite the fact that the [facility] reveals no ischemia. Marland Kitchen He  Has asked to see an eye doctor, dentist. He complains of feet pain, eye pain, headache, constant chest pain.  He became very belligerent with me this am.  He challenged things that I said in the ER on the night of admission regarding his leg pain and venous dopplers.  I told him that we would do a cardiac cath on him to make sure that he did not have any blockages.  I insisted that he could not have the cath today because he ate breakfast.  I told him that we could postpone his cath to this afternoon. He became very angry at me and insisted that he has had caths before and that he knew how these worked.  He told me that he still has groin pain from the last cath.  I told him that we do many caths from the wrist now.  He became very belligerent with me again and insisted that his doctors in [place] never told him that.  He told me that he did not want me doing his cath.  I informed him that I would be doing caths tomorrow and that if he did not want me to do the cath, that he should agree to have it today.    I offered to consult a different cardiology group.  He was silent and did not answer me. I asked him if he would consent for his cath today.  He remained silent.   Objective    Vital Signs:   Temp:  [97.9 F (36.6 C)-98.6 F (37 C)] 98.6 F (37 C) (12/19 0615) Pulse Rate:  [57-65] 62  (12/19 0615) Resp:  [18-20] 18  (12/19 0615) BP: (110-130)/(69-77) 130/77 mmHg (12/19 0615) SpO2:  [100 %] 100 % (12/19 0615) Weight:  [183 lb 3.2 oz (83.1 kg)] 183 lb 3.2 oz (83.1 kg) (12/19 0615)  Last BM  Date: 09/22/12   24-hour weight change: Weight change: 0 lb (0 kg)  Weight trends: Filed Weights   09/20/12 2136 09/22/12 0640 09/23/12 0615  Weight: 183 lb 3.2 oz (83.1 kg) 183 lb 3.2 oz (83.1 kg) 183 lb 3.2 oz (83.1 kg)    Intake/Output:  12/18 0701 - 12/19 0700 In: 1560 [P.O.:1560] Out: -      Physical Exam: BP 130/77  Pulse 62  Temp 98.6 F (37 C) (Oral)  Resp 18  Ht 5\' 6"  (1.676 m)  Wt 183 lb 3.2 oz (83.1 kg)  BMI 29.57 kg/m2  SpO2 100%  General: Vital signs reviewed and noted.  Very angry  Head: Normocephalic, atraumatic.  Eyes: conjunctivae/corneas clear.  EOM's intact.   Throat: normal  Neck:  normal  Lungs:    clear  Heart:  RR   Abdomen:  Soft, non-tender, non-distended    Extremities: No edema, good pulses   Neurologic: A&O X3, CN II - XII are grossly intact.   Psych: Normal     Labs: BMET:  Basename 09/21/12 0222 09/20/12 2201 09/20/12 1619  NA 137 -- 142  K  4.1 -- 3.4*  CL 100 -- 105  CO2 25 -- 27  GLUCOSE 126* -- 96  BUN 11 -- 9  CREATININE 0.84 0.83 --  CALCIUM 8.8 -- 9.2  MG 2.1 -- --  PHOS -- -- --    Liver function tests:  [facility] 09/20/12 1619  AST 21  ALT 23  ALKPHOS 98  BILITOT 0.3  PROT 7.5  ALBUMIN 4.2   No results found for this basename: LIPASE:2,AMYLASE:2 in the last 72 hours  CBC:  Basename 09/21/12 0222 09/20/12 2201 09/20/12 1619  WBC 7.7 8.2 --  NEUTROABS -- -- 4.2  HGB 12.3* 12.0* --  HCT 36.8* 35.6* --  MCV 87.8 86.8 --  PLT 233 233 --    Cardiac Enzymes:  Basename 09/21/12 1240 09/21/12 0222 09/20/12 2201  CKTOTAL -- -- --  CKMB -- -- --  TROPONINI <0.30 <0.30 <0.30    Coagulation Studies:  Basename 09/20/12 1619  LABPROT 14.0  INR 1.09     Basename 09/20/12 2201  TSH 1.484  T4TOTAL --  T3FREE --  THYROIDAB --   No results found for this basename: VITAMINB12,FOLATE,FERRITIN,TIBC,IRON,RETICCTPCT in the last 72 hours   Other results:  EKG :   Medications:    Infusions:     Scheduled Medications:    . amLODipine  5 mg Oral Daily  . aspirin EC  325 mg Oral Daily  . atorvastatin  40 mg Oral Daily  . bisacodyl  10 mg Rectal Once  . carvedilol  12.5 mg Oral BID  . cloNIDine  0.1 mg Oral TID  . clopidogrel  75 mg Oral Daily  . enoxaparin (LOVENOX) injection  40 mg Subcutaneous Q24H  . famotidine  40 mg Oral BID  . fluticasone  2 spray Each Nare BID  . hydrochlorothiazide  25 mg Oral Daily  . losartan  100 mg Oral Daily  . nitroGLYCERIN  1 inch Topical Q6H  . sodium chloride  3 mL Intravenous Q12H    Assessment/ Plan:    Coronary artery disease () - hx of coronary stenting in [place] - he has a hand written note and a stent card that suggests that he has RCA stents.  He has had negative cardiac enzymes. Myoview was negative. ECG shows LVH with repol.  He is worried that the [facility] may be falsely negative.  Since I can't be sure that this is not the case, I agreed to schedule him for cath.     My relationship with him has turned adversarial at this point.  He insists on talking over me whenever I speak and try to discuss issues about his chest pain and the plan.    He finds fault with everything that I say.  He tries to "catch me in a lie" stating that I said one thing that contradicts another thing that I said.  At this point, I do not think I can effectively offer care for him. Since the entire [place] practice covers for each other, I do not think the [facility] group can effectively provide him care.  I have discussed this with Dr. Malachi Bonds and have suggested that she consult another cardiology group to assume his care.   She agrees with the plan and will call another group.  HTN (hypertension) (09/20/2012) BP is OK  Smoker (09/20/2012) I have advised him to stop smoking  PAD (peripheral artery disease) (09/20/2012)  he has good pulses.  I do not see any evidence of significant ischemia.  Cocaine  abuse (09/20/2012)  I have warned him that he is  at great risk for serious complications if he continues to use cocaine.  Dyslipidemia (09/20/2012)   Disposition:  Length of Stay: 3  Vesta Mixer, Montez Hageman., MD, [facility] 09/23/2012, 8:18 AM Office [phone] Pager [phone]

## 2012-09-24 ENCOUNTER — Encounter (HOSPITAL_COMMUNITY): Payer: Self-pay | Admitting: Cardiology

## 2012-09-24 ENCOUNTER — Encounter (HOSPITAL_COMMUNITY)
Admission: EM | Disposition: A | Discharge status: Home / Self Care | Payer: Self-pay | Source: Home / Self Care | Attending: Internal Medicine

## 2012-09-24 DIAGNOSIS — R079 Chest pain, unspecified: Secondary | ICD-10-CM | POA: Diagnosis present

## 2012-09-24 DIAGNOSIS — H348192 Central retinal vein occlusion, unspecified eye, stable: Secondary | ICD-10-CM

## 2012-09-24 HISTORY — DX: Central retinal vein occlusion, unspecified eye, stable: H34.8192

## 2012-09-24 HISTORY — PX: LEFT HEART CATHETERIZATION WITH CORONARY ANGIOGRAM: SHX5451

## 2012-09-24 HISTORY — DX: Chest pain, unspecified: R07.9

## 2012-09-24 SURGERY — LEFT HEART CATHETERIZATION WITH CORONARY ANGIOGRAM
Anesthesia: LOCAL

## 2012-09-24 MED ORDER — ATORVASTATIN CALCIUM 40 MG PO TABS: 40.0000 mg | ORAL_TABLET | Freq: Every day | ORAL | Status: DC

## 2012-09-24 MED ORDER — POLYVINYL ALCOHOL 1.4 % OP SOLN: 1.0000 [drp] | OPHTHALMIC | Status: DC | PRN

## 2012-09-24 MED ORDER — HYDROCODONE-ACETAMINOPHEN 5-325 MG PO TABS: 1.0000 | ORAL_TABLET | ORAL | Status: DC | PRN

## 2012-09-24 MED ORDER — LIDOCAINE HCL (PF) 1 % IJ SOLN
INTRAMUSCULAR | Status: AC
  Filled 2012-09-24: qty 30

## 2012-09-24 MED ORDER — MIDAZOLAM HCL 2 MG/2ML IJ SOLN
INTRAMUSCULAR | Status: AC
  Filled 2012-09-24: qty 2

## 2012-09-24 MED ORDER — ASPIRIN 81 MG PO CHEW: 81.0000 mg | CHEWABLE_TABLET | Freq: Every day | ORAL | Status: DC

## 2012-09-24 MED ORDER — ADENOSINE 12 MG/4ML IV SOLN
16.0000 mL | Freq: Once | INTRAVENOUS | Status: DC
  Filled 2012-09-24: qty 16

## 2012-09-24 MED ORDER — CLOPIDOGREL BISULFATE 75 MG PO TABS: 75.0000 mg | ORAL_TABLET | Freq: Every day | ORAL | Status: DC

## 2012-09-24 MED ORDER — ONDANSETRON HCL 4 MG/2ML IJ SOLN: 4.0000 mg | Freq: Four times a day (QID) | INTRAMUSCULAR | Status: DC | PRN

## 2012-09-24 MED ORDER — LOSARTAN POTASSIUM-HCTZ 100-25 MG PO TABS: 1.0000 | ORAL_TABLET | Freq: Every day | ORAL | Status: DC

## 2012-09-24 MED ORDER — RANITIDINE HCL 150 MG PO TABS: 150.0000 mg | ORAL_TABLET | Freq: Two times a day (BID) | ORAL | Status: DC

## 2012-09-24 MED ORDER — CLONIDINE HCL 0.1 MG PO TABS: 0.1000 mg | ORAL_TABLET | Freq: Three times a day (TID) | ORAL | Status: DC

## 2012-09-24 MED ORDER — FLUNISOLIDE 25 MCG/ACT (0.025%) NA SOLN: 2.0000 | Freq: Two times a day (BID) | NASAL | Status: DC

## 2012-09-24 MED ORDER — SODIUM CHLORIDE 0.9 % IV SOLN
INTRAVENOUS | Status: AC
  Administered 2012-09-24: 09:00:00 via INTRAVENOUS

## 2012-09-24 MED ORDER — AMLODIPINE BESYLATE 5 MG PO TABS: 5.0000 mg | ORAL_TABLET | Freq: Every day | ORAL | Status: DC

## 2012-09-24 MED ORDER — PROMETHAZINE HCL 12.5 MG PO TABS: 12.5000 mg | ORAL_TABLET | Freq: Four times a day (QID) | ORAL | Status: DC | PRN

## 2012-09-24 MED ORDER — NAPROXEN SODIUM 220 MG PO TABS: 220.0000 mg | ORAL_TABLET | Freq: Two times a day (BID) | ORAL | Status: DC | PRN

## 2012-09-24 MED ORDER — FENTANYL CITRATE 0.05 MG/ML IJ SOLN
INTRAMUSCULAR | Status: AC
  Filled 2012-09-24: qty 2

## 2012-09-24 MED ORDER — ACETAMINOPHEN 325 MG PO TABS: 650.0000 mg | ORAL_TABLET | ORAL | Status: DC | PRN

## 2012-09-24 MED ORDER — CARVEDILOL 12.5 MG PO TABS: 12.5000 mg | ORAL_TABLET | Freq: Two times a day (BID) | ORAL | Status: DC

## 2012-09-24 MED ORDER — HEPARIN (PORCINE) IN NACL 2-0.9 UNIT/ML-% IJ SOLN
INTRAMUSCULAR | Status: AC
  Filled 2012-09-24: qty 1000

## 2012-09-24 MED ORDER — BIVALIRUDIN 250 MG IV SOLR
INTRAVENOUS | Status: AC
  Filled 2012-09-24: qty 250

## 2012-09-24 MED ORDER — NITROGLYCERIN 0.2 MG/ML ON CALL CATH LAB
INTRAVENOUS | Status: AC
  Filled 2012-09-24: qty 1

## 2012-09-24 NOTE — Discharge Summary (Signed)
Physician Discharge Summary  Kody Brandl ZOX:[id number] DOB: 26-Jan-1961 DOA: 09/20/2012  PCP: Default, Provider, MD  Admit date: 09/20/2012 Discharge date: 09/24/2012  Recommendations for Outpatient Follow-up:  1. Establish a new PCP for ongoing care in the area or return to [place] for ongoing care.   2. Ophthalmology follow up with Fawn Kirk, Marva Panda or Stephannie Li. Given information for all three and recommended to call as soon as possible for an appointment.    Discharge Diagnoses:  Principal Problem:  *Chest pain Active Problems:  Coronary artery disease  HTN (hypertension)  Smoker  PAD (peripheral artery disease)  Cocaine abuse  Dyslipidemia  Visual field defect  Constipation  Retinal vein occlusion   Discharge Condition: stable, improved  Diet recommendation: healthy heart  Wt Readings from Last 3 Encounters:  09/23/12 83.1 kg (183 lb 3.2 oz)  09/23/12 83.1 kg (183 lb 3.2 oz)  09/23/12 83.1 kg (183 lb 3.2 oz)    History of present illness:   Cody [name] is a 51 y.o. male, with history of coronary artery disease, PAD status post 4 stents placed at different times all in [place] last 1 one year ago, he does not know what type of stent but has been told that he has to take Plavix for the rest of his life. Patient also has hypertension, dyslipidemia, he is an active smoker and uses cocaine intermittently. He used cocaine last about 7-10 days ago, he presents to the hospital with a one-day history of left-sided intermittent chest pain which comes by itself last for about 10-15 minutes each time, anus nonradiating and sharp, sometimes makes him Caidyn Henricksen of breath, no aggravating relieving factors no other associate symptoms. Presented to the ER where his first set of troponin was negative, EKG showed some lateral lead changes, he is currently pain-free I was called to admit the patient.    Hospital Course:   Coronary artery disease s/p multiple  stents, currently on plavix. Cardiac enzymes and myoview were negative.  Because the patient had previously had a negative stress test, but then required catheterization with stent placement a few days later and because of ongoing mild chest discomfort, he underwent coronary catheterization on 12/20.    "ANGIOGRAPHIC DATA: The left main coronary artery is widely patent.  The left anterior descending artery is dominant and wraps around the left ventricular apex. There is a stent in the mid to LAD distal to the septal perforator proximal to the stent and distal to the cell to perforator is an eccentric region of 50% narrowing the ostial LAD contains 30% narrowing.  .  The left circumflex artery is large, given origin to several obtuse marginal branches. The proximal to mid vessel contains 30-40% narrowing.  The right coronary artery is dominant. The vessel contains overlapping mid vessel stents and a further distal stent is also noted. The stents are patent. In the distal vessel beyond the last stent there is concentric 30-40% narrowing.   LEFT VENTRICULOGRAM: Left ventricular angiogram was done in the 30 RAO projection and revealed normal left ventricular wall motion and systolic function with an estimated ejection fraction of 50%.   IMPRESSIONS: 1. Chest pain of uncertain cause.  2. Widely patent mid LAD stent and widely patent overlapping mid RCA stents. A distal RCA stent is also patent.  3. Proximal to the LAD stent there is an eccentric 50-60% narrowing. The FFR across this lesion is 0.86.  4. 30-40% stenosis distal to the distal RCA stent.  5.  Low normal LV function "  He should follow up with his regular cardiologist as needed or at his previously suggested follow up time.  His chest pain may be related to chest wall strain, GERD, or neuropathy.  He should continue zantac as an outpatient.  He may require escalation to a PPI.  He may use naprosyn with vicodin for breakthrough pain as an  outpatient.    Systolic CHF- Per Myoview EF 44 %. ECHO demonstrated EF of 55-60% with mildly dilated cavity and slight relative hypokinesis of the inferolateral wall.  LV gram during catheterization demonstrated low normal LV function.   HTN (hypertension), blood pressure elevated despite continuation of home medications.  He was started on clonidine 0.1mg  TID and his blood pressure trended down to normal range.  He continued to have Claudetta Sallie periods of elevated BP during periods of agitation, but returned to normal limits after he was calm.    Hemionopia that developed acutely during hospitalization.  ESR was normal.  MRI demonstrated no infarct, prominent nonspecific white matter changes and mild exophthalmos.  Ophthalmology was consulted and patient was found to have a retinal vein occlusion with some retinal hemorrhages and cotton wool spots. He also had some presbyopia and pinguecula OD.  He was advised to follow up with retinal specialist as an outpatient for possible laser ablation OD or avastin injections.  Recommend locally: Fawn Kirk, Alan Mulder or Stephannie Li.    Smoker, Advised to quit.   PAD (peripheral artery disease): Cont ASA and Plavix Cocaine abuse: Advised to quite.  Hx of PUD: He was started on H2 blocker and maalox  Constipation:  Relieved with magnesium citrate   Consultants:  Cardiology  ophthalmology Procedures:  myoview  ECHO Coronary catheterization 12/20 by Dr. Verdis Prime III, [facility] Antibiotics:  none  Discharge Exam: Filed Vitals:   09/24/12 1345  BP: 119/73  Pulse: 73  Temp: 98.7 F (37.1 C)  Resp: 20   Filed Vitals:   09/24/12 1100 09/24/12 1115 09/24/12 1130 09/24/12 1345  BP: 120/86 115/87 108/73 119/73  Pulse: 74   73  Temp:    98.7 F (37.1 C)  TempSrc:    Oral  Resp:    20  Height:      Weight:      SpO2:    96%   General: AAM, no acute distress HEENT:  5mm soft, spherical mobile nodule located near left parietal area consistent  with cyst, lymph node, or lipoma. Cardiovascular: RRR, no murmurs  Respiratory: CTA b/l  Abdomen: soft, mildly distended, nontender. NABS  Ext: no c/c/e.  Right groin with dressing intact. No bruit or hematoma.  RLE with normal strength and sensation, 2+ DP pulse and brisk CR.    Discharge Instructions      Discharge Orders    Future Orders Please Complete By Expires   Diet - low sodium heart healthy      Increase activity slowly      Discharge instructions      Comments:   You were hospitalized with chest pain.  Your cardiac enzyme blood tests were negative.  You had a negative stress test and your cardiac catheterization demonstrated no coronary arteries that had severe stenosis or narrowing or clot and you did not require angioplasty or stenting.  Your chest pain is not related to your heart.  You may be having some chest wall pain which can be treated with naprosyn, warm compresses, and vicodin as needed for breakthrough pain.  You  may also have some acid reflux which may get better with zantac.  If these interventions do not help, you may have a nerve-related problem which would need to be addressed by your primary care doctor at a future date.  You also had some vision loss and were seen by ophthalmology.  You had a retinal vein occlusion and will need to see an ophthalmologist urgently as an outpatient.  Three physicians were recommended to you and their contact information was put in your discharge paperwork.  Please follow up with one of them as soon as possible.  Your blood pressure was very elevated and you were started on an additional blood pressure medication, clonidine, to be taken three times daily.  Please take your next dose this evening.  We recommend you quit smoking and using drugs.  Please continue a baby aspirin and a full dose plavix.  Use magnesium citrate as needed for constipation.  Follow up with your cardiologist as needed and with your primary care doctor within 2-4  weeks.   Call MD for:  temperature >100.4      Call MD for:  persistant nausea and vomiting      Call MD for:  severe uncontrolled pain      Call MD for:  redness, tenderness, or signs of infection (pain, swelling, redness, odor or green/yellow discharge around incision site)      Call MD for:  difficulty breathing, headache or visual disturbances      Call MD for:  hives      Call MD for:  persistant dizziness or light-headedness      Call MD for:  extreme fatigue      (HEART FAILURE PATIENTS) Call MD:  Anytime you have any of the following symptoms: 1) 3 pound weight gain in 24 hours or 5 pounds in 1 week 2) shortness of breath, with or without a dry hacking cough 3) swelling in the hands, feet or stomach 4) if you have to sleep on extra pillows at night in order to breathe.          Medication List     As of 09/24/2012  2:31 PM    STOP taking these medications         aspirin EC 325 MG tablet      TAKE these medications         amLODipine 5 MG tablet   Commonly known as: NORVASC   Take 1 tablet (5 mg total) by mouth daily.      aspirin 81 MG chewable tablet   Chew 1 tablet (81 mg total) by mouth daily.      atorvastatin 40 MG tablet   Commonly known as: LIPITOR   Take 1 tablet (40 mg total) by mouth daily.      carvedilol 12.5 MG tablet   Commonly known as: COREG   Take 1 tablet (12.5 mg total) by mouth 2 (two) times daily.      cloNIDine 0.1 MG tablet   Commonly known as: CATAPRES   Take 1 tablet (0.1 mg total) by mouth 3 (three) times daily.      clopidogrel 75 MG tablet   Commonly known as: PLAVIX   Take 1 tablet (75 mg total) by mouth daily.      flunisolide 25 MCG/ACT (0.025%) Soln   Commonly known as: NASALIDE   Inhale 2 sprays into the lungs 2 (two) times daily.      HYDROcodone-acetaminophen 5-325 MG per tablet  Commonly known as: NORCO/VICODIN   Take 1-2 tablets by mouth every 4 (four) hours as needed.      losartan-hydrochlorothiazide 100-25 MG per  tablet   Commonly known as: HYZAAR   Take 1 tablet by mouth daily.      naproxen sodium 220 MG tablet   Commonly known as: ANAPROX   Take 1 tablet (220 mg total) by mouth 2 (two) times daily as needed. For pain      polyvinyl alcohol 1.4 % ophthalmic solution   Commonly known as: LIQUIFILM TEARS   Place 1 drop into both eyes as needed.      promethazine 12.5 MG tablet   Commonly known as: PHENERGAN   Take 1 tablet (12.5 mg total) by mouth every 6 (six) hours as needed for nausea.      ranitidine 150 MG tablet   Commonly known as: ZANTAC   Take 1 tablet (150 mg total) by mouth 2 (two) times daily.         Follow-up Information    Follow up with Default, Provider, MD.   Contact information:   [address] [place] [address] [phone]       Follow up with Edmon Crape, MD.   Contact information:   [address] [place] [address] [phone]       Follow up with MATTHEWS, Beulah Gandy, MD.   Contact information:   [address] [place] [address] [phone]       Follow up with Donnel Saxon, MD.   Contact information:   [address] [place] [address] [phone]           The results of significant diagnostics from this hospitalization (including imaging, microbiology, ancillary and laboratory) are listed below for reference.    Significant Diagnostic Studies: Dg Chest 2 View  09/20/2012  *RADIOLOGY REPORT*  Clinical Data: Chest pain.  History of hypertension and smoking.  CHEST - 2 VIEW  Comparison: None.  Findings: The heart size and mediastinal contours are normal. Coronary artery stents are noted.  The lungs are clear.  There is no pleural effusion or pneumothorax.  The osseous structures appear normal.  IMPRESSION: No active cardiopulmonary process.   Original Report Authenticated By: Carey Bullocks, M.D.    Mr Brain Wo Contrast  09/21/2012  *RADIOLOGY REPORT*  Clinical Data: Visual  changes medial aspect left eye. Right lower leg weakness.  Hypertensive smoker with history of cocaine induced. Dyslipidemia.  MRI HEAD WITHOUT CONTRAST  Technique:  Multiplanar, multiecho pulse sequences of the brain and surrounding structures were obtained according to standard protocol without intravenous contrast.  Comparison: None.  Findings: No acute infarct.  No intracranial hemorrhage.  Prominent nonspecific white matter type changes.  Given the patient's history, white matter type changes are suggestive of result of a small vessel disease although other causes such as that secondary to; vasculitis, inflammatory process or demyelinating process not entirely excluded.  No intracranial mass lesion detected on this unenhanced exam.  No hydrocephalus.  Major intracranial vascular structures are patent.  Polypoid opacification inferior aspect left maxillary sinus.  Mild mucosal thickening inferior right maxillary sinus.  Mild exophthalmos.  IMPRESSION:  No acute infarct.  No intracranial hemorrhage.  Prominent nonspecific white matter type changes.  Mild exophthalmos.  Please see above.   Original Report Authenticated By: Lacy Duverney, M.D.    Nm Myocar Multi W/spect W/wall Motion / Ef  09/21/2012  *RADIOLOGY REPORT*  Clinical  Data:  Chest pain.  MYOCARDIAL IMAGING WITH SPECT (REST AND PHARMACOLOGIC-STRESS) GATED LEFT VENTRICULAR WALL MOTION STUDY LEFT VENTRICULAR EJECTION FRACTION  Technique:  Standard myocardial SPECT imaging was performed after resting intravenous injection of 10 mCi Tc-77m sestamibi. Subsequently, intravenous infusion of Lexiscan was performed under the supervision of the Cardiology staff.  At peak effect of the drug, 30 mCi Tc-46m sestamibi was injected intravenously and standard myocardial SPECT  imaging was performed.  Quantitative gated imaging was also performed to evaluate left ventricular wall motion, and estimate left ventricular ejection fraction.  Comparison:  None.  Findings:  The SPECT stress and rest images demonstrate no fixed or reversible defects to suggest infarction or ischemia.  There is mild global hypokinesis on the gated SPECT images with a calculated ejection fraction of 44%.  IMPRESSION:  1.  No findings for infarction or ischemia. 2.  Mild global hypokinesis with estimated ejection fraction of 44%.   Original Report Authenticated By: Rudie Meyer, M.D.     Microbiology: Recent Results (from the past 240 hour(s))  URINE CULTURE     Status: Normal   Collection Time   09/20/12  6:38 PM      Component Value Range Status Comment   Specimen Description URINE, RANDOM   Final    Special Requests NONE   Final    Culture  Setup Time 09/20/2012 20:09   Final    Colony Count NO GROWTH   Final    Culture NO GROWTH   Final    Report Status 09/22/2012 FINAL   Final   MRSA PCR SCREENING     Status: Normal   Collection Time   09/20/12  9:31 PM      Component Value Range Status Comment   MRSA by PCR NEGATIVE  NEGATIVE Final      Labs: Basic Metabolic Panel:  Lab 09/21/12 1610 09/20/12 2201 09/20/12 1619  NA 137 -- 142  K 4.1 -- 3.4*  CL 100 -- 105  CO2 25 -- 27  GLUCOSE 126* -- 96  BUN 11 -- 9  CREATININE 0.84 0.83 0.75  CALCIUM 8.8 -- 9.2  MG 2.1 -- --  PHOS -- -- --   Liver Function Tests:  Lab 09/20/12 1619  AST 21  ALT 23  ALKPHOS 98  BILITOT 0.3  PROT 7.5  ALBUMIN 4.2   No results found for this basename: LIPASE:5,AMYLASE:5 in the last 168 hours No results found for this basename: AMMONIA:5 in the last 168 hours CBC:  Lab 09/21/12 0222 09/20/12 2201 09/20/12 1619  WBC 7.7 8.2 8.3  NEUTROABS -- -- 4.2  HGB 12.3* 12.0* 13.1  HCT 36.8* 35.6* 37.9*  MCV 87.8 86.8 86.5  PLT 233 233 245   Cardiac Enzymes:  Lab 09/21/12 1240 09/21/12 0222 09/20/12 2201  CKTOTAL -- -- --  CKMB -- -- --  CKMBINDEX -- -- --  TROPONINI <0.30 <0.30 <0.30   BNP: BNP (last 3 results) No results found for this basename: PROBNP:3 in the last 8760  hours CBG: No results found for this basename: GLUCAP:5 in the last 168 hours  Time coordinating discharge: 45 minutes  Signed:  Zawadi Aplin  [facility] 09/24/2012, 2:31 PM

## 2012-09-24 NOTE — Progress Notes (Signed)
Pt discharge instructions and patient education completed with patient. Cath site WNL and Dr. Katrinka Blazing reevaluated site and also cleared patient for discharge. IV d/c. Site WNL. Prescriptions and discharge paperwork with patient. D/C home. [name]

## 2012-09-24 NOTE — Progress Notes (Signed)
Bed rest for cath over at 1245. Site WNL. Pt ambulated around room with no complications. Will continue to monitor. Cody [name]

## 2012-09-24 NOTE — H&P (Signed)
  Patient is 11 and has a history of coronary artery disease with previous right coronary stents dating back to 2001. Most recent bare-metal stent was placed in 2011. He has had chest pain similar to prior episodes of angina. The nuclear study performed was negative for ischemia however the patient states that he has had previously normal nuclear/stress test is still had high grade disease that required stenting. We are proceeding with catheterization to define anatomy and help guide therapy. The patient understands the procedure and potential risks involved including stroke, death, myocardial infarction, emergency surgery, limb ischemia, allergies, and kidney injury.

## 2012-09-24 NOTE — CV Procedure (Signed)
     Diagnostic Cardiac Catheterization Report  Cody [name]  51 y.o.  male [birth year]  Procedure Date: 09/24/2012 Referring Physician:  Dr. Malachi Bonds Primary Cardiologist: HWBSmith, MD   PROCEDURE:  Left heart catheterization with selective coronary angiography, left ventriculogram.  INDICATIONS:  Recurring chest pain in a patient with a known history of right coronary and LAD stents and previously documented false-negative stress nuclear studies. The study done on this occasion was normal. The patient demanded coronary angiography because of his prior experiences.  The risks, benefits, and details of the procedure were explained to the patient.  The patient verbalized understanding and wanted to proceed.  Informed written consent was obtained.  PROCEDURE TECHNIQUE:  After Xylocaine anesthesia a 5 French sheath was placed in the right femoral artery with a single anterior needle wall stick.   Coronary angiography was done using a 5 [place] A2 multipurpose catheter.  Left ventriculography was done using a 5 [place] A2 multipurpose catheter.   After reviewing the digital images a 50% mid LAD proximal to the LAD stent was noted. We decided to perform FFR of the LAD lesion to rule out ischemia producing potential. A 5 French 3.5 cm EBU catheter was used to perform guiding shots. A bivalirudin bolus and infusion was started to produce anticoagulation. ACT was documented greater than 300 seconds. We then normalized the Prime Wire pressure wire. We then advanced the lesion across the stenosis in the mid LAD placing the since or at least 3 cm beyond the region of concern. IV adenosine was then infused producing maximal hyperemia and FFR of 0.86 peak. Pullback recording across the stenosis in the mid LAD did not increase the FFR. The FFR normalized in the left main.  A means closure device was used with success.   CONTRAST:  Total of 110 cc.  COMPLICATIONS:  None.    HEMODYNAMICS:  Aortic pressure  was 117/78 mmHg; LV pressure was 117/7 mmHg; LVEDP 14 mmHg.  There was no gradient between the left ventricle and aorta.    ANGIOGRAPHIC DATA:   The left main coronary artery is widely patent.  The left anterior descending artery is dominant and wraps around the left ventricular apex. There is a stent in the mid to LAD distal to the septal perforator proximal to the stent and distal to the cell to perforator is an eccentric region of 50% narrowing the ostial LAD contains 30% narrowing. .  The left circumflex artery is large, given origin to several obtuse marginal branches. The proximal to mid vessel contains 30-40% narrowing.  The right coronary artery is dominant. The vessel contains overlapping mid vessel stents and a further distal stent is also noted. The stents are patent. In the distal vessel beyond the last stent there is concentric 30-40% narrowing.  LEFT VENTRICULOGRAM:  Left ventricular angiogram was done in the 30 RAO projection and revealed normal left ventricular wall motion and systolic function with an estimated ejection fraction of 50%.    IMPRESSIONS:  1. Chest pain of uncertain cause.  2. Widely patent mid LAD stent and widely patent overlapping mid RCA stents. A distal RCA stent is also patent.  3. Proximal to the LAD stent there is an eccentric 50-60% narrowing. The FFR across this lesion is 0.86.  4. 30-40% stenosis distal to the distal RCA stent.  5. Low normal LV function   RECOMMENDATION:  Ambulate into hours. Discharge later today if groin remained stable.

## 2012-09-27 MED FILL — Dextrose Inj 5%: INTRAVENOUS | Qty: 50 | Status: AC

## 2013-11-24 ENCOUNTER — Telehealth: Payer: Self-pay | Admitting: Interventional Cardiology

## 2013-11-24 NOTE — Telephone Encounter (Signed)
New message     Pt had cath 09-24-12 by Dr Katrinka BlazingSmith.  He lived out of town but is now back in town.  He says he is having problems at the cath site.  Leg feels swollen and it feels "wet" at that site.  Could this be from the cath?  Did pt get a stent in heart at that time?

## 2013-11-24 NOTE — Telephone Encounter (Signed)
returned pt call.pt adv that he did not have stent placement after cath 09/2012.pt sts that he is having leg pain that he relates back to cath site.adv him after discussing with Dr.Smith.his left leg pain is not due to cath back in 09/2012.adv pt that he should be seen by an urgent care or pcp.pt sts that he was seen by a cardiologist back in [place] over 1 year ago and has had the same complaint about leg pain.pt insist on going to an ED to have an xray.adv pt to start with primary if he has cardiac symptoms to call back to be seen.pt agreeable with plan and verbalized understanding.

## 2014-06-03 ENCOUNTER — Emergency Department (HOSPITAL_COMMUNITY)
Admission: EM | Admit: 2014-06-03 | Discharge: 2014-06-04 | Disposition: A | Discharge status: Home / Self Care | Payer: Medicare Other | Attending: Emergency Medicine | Admitting: Emergency Medicine

## 2014-06-03 DIAGNOSIS — F17200 Nicotine dependence, unspecified, uncomplicated: Secondary | ICD-10-CM | POA: Insufficient documentation

## 2014-06-03 DIAGNOSIS — Z79899 Other long term (current) drug therapy: Secondary | ICD-10-CM | POA: Diagnosis not present

## 2014-06-03 DIAGNOSIS — Z9861 Coronary angioplasty status: Secondary | ICD-10-CM | POA: Diagnosis not present

## 2014-06-03 DIAGNOSIS — K219 Gastro-esophageal reflux disease without esophagitis: Secondary | ICD-10-CM | POA: Insufficient documentation

## 2014-06-03 DIAGNOSIS — E785 Hyperlipidemia, unspecified: Secondary | ICD-10-CM | POA: Diagnosis not present

## 2014-06-03 DIAGNOSIS — G8929 Other chronic pain: Secondary | ICD-10-CM | POA: Insufficient documentation

## 2014-06-03 DIAGNOSIS — R109 Unspecified abdominal pain: Secondary | ICD-10-CM | POA: Diagnosis present

## 2014-06-03 DIAGNOSIS — I10 Essential (primary) hypertension: Secondary | ICD-10-CM | POA: Diagnosis not present

## 2014-06-03 DIAGNOSIS — Z7982 Long term (current) use of aspirin: Secondary | ICD-10-CM | POA: Insufficient documentation

## 2014-06-03 DIAGNOSIS — I2510 Atherosclerotic heart disease of native coronary artery without angina pectoris: Secondary | ICD-10-CM | POA: Insufficient documentation

## 2014-06-03 DIAGNOSIS — R519 Headache, unspecified: Secondary | ICD-10-CM

## 2014-06-03 DIAGNOSIS — R51 Headache: Secondary | ICD-10-CM | POA: Insufficient documentation

## 2014-06-03 DIAGNOSIS — R1031 Right lower quadrant pain: Secondary | ICD-10-CM

## 2014-06-03 NOTE — ED Provider Notes (Signed)
CSN: [id number]     Arrival date & time 06/03/14  2205 History   First MD Initiated Contact with Patient 06/03/14 2212     Chief Complaint  Patient presents with  . Groin Pain     (Consider location/radiation/quality/duration/timing/severity/associated sxs/prior Treatment) HPI Comments: Cody [name] is a 53 y.o. male with a PMHx of HTN, CAD, HLD, PAD, cocaine abuse, and GERD, who presents to the ED with multiple chronic complaints, primarily ongoing R sided groin pain x2 yrs. States the pain is a sharp "sticking", 8/10, intermittent, nonradiating pain worse with hip movement and flexion, and unrelieved by "everything"- when asked to clarify, he states he took tylenol #3 and heat with no relief. Denies fevers, chills, CP, SOB, cough, abd pain, N/V, changes in bowel habits, urinary symptoms, scrotal or testicular pain, myalgias, arthralgias, LE swelling, LAD, rashes, paresthesias, or weakness. Denies claudication symptoms. States this pain is worse today but is the same as his pain has been for the last 2 yrs. Prior hx of cardiac cath at this area in 2013.  Other complaint includes scalp pain and "sloshing" sensation in top of scalp x2 yrs.   Patient is a 53 y.o. male presenting with groin pain. The history is provided by the patient. No language interpreter was used.  Groin Pain This is a chronic problem. The current episode started more than 1 year ago. The problem occurs intermittently. The problem has been unchanged. Associated symptoms include headaches (chronic, ongoing and unchanged). Pertinent negatives include no abdominal pain, arthralgias, change in bowel habit, chest pain, chills, congestion, coughing, diaphoresis, fatigue, fever, joint swelling, myalgias, nausea, neck pain, numbness, rash, sore throat, swollen glands, urinary symptoms, vertigo, visual change, vomiting or weakness. The symptoms are aggravated by walking and bending. Treatments tried: "everything", when asked to specify  states "tylenol #3 and heat" The treatment provided mild relief.    Past Medical History  Diagnosis Date  . Coronary artery disease   . HTN (hypertension)   . Dyslipidemia 09/20/2012  . PAD (peripheral artery disease) 09/20/2012  . Smoker 09/20/2012  . Cocaine abuse 09/20/2012  . GERD (gastroesophageal reflux disease)     has stomach ulcers a while back   Past Surgical History  Procedure Laterality Date  . Coronary stent placement     Family History  Problem Relation Age of Onset  . CAD Father   . CAD Brother   . Hypertension Mother    History  Substance Use Topics  . Smoking status: Current Every Day Smoker    Types: Cigarettes  . Smokeless tobacco: Not on file  . Alcohol Use: 1.2 oz/week    2 Cans of beer, 1-2 Shots of liquor per week    Review of Systems  Constitutional: Negative for fever, chills, diaphoresis and fatigue.  HENT: Negative for congestion, facial swelling and sore throat.   Eyes: Negative for photophobia, pain and visual disturbance.  Respiratory: Negative for cough, chest tightness and shortness of breath.   Cardiovascular: Negative for chest pain and palpitations.  Gastrointestinal: Negative for nausea, vomiting, abdominal pain, diarrhea, constipation and change in bowel habit.  Genitourinary: Negative for dysuria, urgency, hematuria, decreased urine volume, discharge, penile swelling, scrotal swelling, penile pain and testicular pain.       +groin pain  Musculoskeletal: Negative for arthralgias, joint swelling, myalgias and neck pain.  Skin: Negative for color change and rash.  Neurological: Positive for headaches (chronic, ongoing and unchanged). Negative for dizziness, vertigo, tremors, syncope, facial asymmetry, speech difficulty, weakness, light-headedness  and numbness.  Hematological: Does not bruise/bleed easily.  Psychiatric/Behavioral: Negative for confusion.  10 Systems reviewed and are negative for acute change except as noted in the  HPI.     Allergies  Contrast media; Peanuts; and Plavix  Home Medications   Prior to Admission medications   Medication Sig Start Date End Date Taking? Authorizing Provider  acetaminophen-codeine (TYLENOL #3) 300-30 MG per tablet Take 1-2 tablets by mouth every 4 (four) hours as needed for moderate pain.   Yes Historical Provider, MD  amLODipine (NORVASC) 5 MG tablet Take 1 tablet (5 mg total) by mouth daily. 09/24/12  Yes Renae Fickle, MD  aspirin EC 81 MG tablet Take 81 mg by mouth daily.   Yes Historical Provider, MD  carvedilol (COREG) 12.5 MG tablet Take 1 tablet (12.5 mg total) by mouth 2 (two) times daily. 09/24/12  Yes Renae Fickle, MD  cloNIDine (CATAPRES) 0.1 MG tablet Take 1 tablet (0.1 mg total) by mouth 3 (three) times daily. 09/24/12  Yes Renae Fickle, MD  flunisolide (NASALIDE) 25 MCG/ACT (0.025%) SOLN Inhale 2 sprays into the lungs 2 (two) times daily. 09/24/12  Yes Renae Fickle, MD  omeprazole (PRILOSEC) 40 MG capsule Take 40 mg by mouth daily.   Yes Historical Provider, MD  ranitidine (ZANTAC) 150 MG tablet Take 1 tablet (150 mg total) by mouth 2 (two) times daily. 09/24/12  Yes Renae Fickle, MD  simvastatin (ZOCOR) 40 MG tablet Take 40 mg by mouth daily.   Yes Historical Provider, MD  oxyCODONE-acetaminophen (PERCOCET) 5-325 MG per tablet Take 1 tablet by mouth every 8 (eight) hours as needed for severe pain. 06/04/14   G[place] Revoir Strupp Camprubi-Soms, PA-C   BP 142/95  Pulse 64  Temp(Src) 98 F (36.7 C) (Oral)  Resp 17  SpO2 100% Physical Exam  Nursing note and vitals reviewed. Constitutional: He is oriented to person, place, and time. Vital signs are normal. He appears well-developed and well-nourished. No distress.  VSS although intermittently elevated BPs consistent with pt's baseline per pt. NAD, afebrile, nontoxic  HENT:  Head: Normocephalic and atraumatic.    Right Ear: Hearing, tympanic membrane, external ear and ear canal normal.  Left  Ear: Hearing, tympanic membrane, external ear and ear canal normal.  Nose: Nose normal.  Mouth/Throat: Uvula is midline, oropharynx is clear and moist and mucous membranes are normal.  [place]ureles/AT. Subjective reports of scalp swelling without objective findings. Tender over parietal areas bilaterally without obvious deformity or crepitus, no temporal tenderness  Eyes: Conjunctivae and EOM are normal. Pupils are equal, round, and reactive to light. Right eye exhibits no discharge. Left eye exhibits no discharge.  PERRL, EOMI, no visual field deficit  Neck: Trachea normal and normal range of motion. Neck supple. No JVD present. No spinous process tenderness and no muscular tenderness present. No rigidity. Normal range of motion present.  FROM intact without TTP along paraspinous muscles and spinous processes  Cardiovascular: Normal rate, regular rhythm, normal heart sounds and intact distal pulses.  Exam reveals no gallop and no friction rub.   No murmur heard. Intact distal pulses in all extremities, RRR, nl s1/s2, no m/r/g  Pulmonary/Chest: Effort normal and breath sounds normal. No respiratory distress. He has no decreased breath sounds. He has no wheezes. He has no rhonchi. He has no rales.  Abdominal: Soft. Normal appearance and bowel sounds are normal. He exhibits no distension. There is no tenderness. There is no rigidity, no rebound, no guarding and no CVA tenderness. Hernia confirmed negative in  the right inguinal area and confirmed negative in the left inguinal area.  Genitourinary: Testes normal and penis normal.     R groin TTP along inguinal canal without hernia, no appreciable masses, no skin changes or swelling, no fluctuance. No LAD noted  Musculoskeletal: Normal range of motion.  MAE x4. Strength 5/5 in all extremities, sensation grossly intact in all extremities. Distal pulses intact in all extremities.  Lymphadenopathy:       Right: No inguinal adenopathy present.       Left: No  inguinal adenopathy present.  Neurological: He is alert and oriented to person, place, and time. He has normal strength. No sensory deficit. Coordination normal.  Skin: Skin is warm, dry and intact. No rash noted.  No erythema, swelling, or skin changes over all exposed surfaces including his R groin. No pedal edema  Psychiatric: He has a normal mood and affect. His speech is tangential (at times). He is agitated.  Became agitated during exam, had to redirect several times in order to stay on topic    ED Course  Procedures (including critical care time) Labs Review Labs Reviewed  BASIC METABOLIC PANEL - Abnormal; Notable for the following:    Glucose, Bld 109 (*)    All other components within normal limits  CBC WITH DIFFERENTIAL - Abnormal; Notable for the following:    Neutrophils Relative % 42 (*)    All other components within normal limits    Imaging Review No results found.   EKG Interpretation None      MDM   Final diagnoses:  Groin pain, chronic, right  Scalp pain  HTN (hypertension), benign    53y/o male with multiple complaints, very convoluted story and difficult to assess exact reason for presentation in ED. R groin pain ongoing x2 yrs, several notes in past regarding this issue, but pt flips back and forth on whether this is chronic or acute. Discussed with Dr. Mora Bellman who agreed to obtain basic labs and d/c with pain medicine. Pt resistant to be given "just any" pain med, but never instructed further on what medication would be okay with him. Finally after labs returned negative, pt became agitated regarding follow up instructions to see a regular dr, discussed resource guide, and stated he didn't have transportation, and began going into several more complaints and frustrations. Had to redirect several times. Ultimately, pt agreed to percocet prior to d/c, with rx for few tabs, but discussed that in order for chronic pain control to be addressed, he needed to use  resource guide and see a PCP. He agrees. Unclear exactly what the issue was with his scalp pain x2 yrs, but no objective findings. Doubt SAH given length of time passed since symptoms began. BPs consistent with his normal, per pt, and would decrease once pt calmed down. No acute issue was found at this time, and pt is stable for d/c. CBC and BMP WNL. Doubt highly this is a DVT given length of symptoms and no appreciable clinical signs. Discussed outpatient management including heat to the area. I explained the diagnosis and have given explicit precautions to return to the ER including for any other new or worsening symptoms. The patient understands and accepts the medical plan as it's been dictated and I have answered their questions. Discharge instructions concerning home care and prescriptions have been given. The patient is STABLE and is discharged to home in good condition.  BP 150/87  Pulse 73  Temp(Src) 98 F (36.7 C) (Oral)  Resp 18  SpO2 100%  Meds ordered this encounter  Medications  . oxyCODONE-acetaminophen (PERCOCET/ROXICET) 5-325 MG per tablet 1 tablet    Sig:   . oxyCODONE-acetaminophen (PERCOCET) 5-325 MG per tablet    Sig: Take 1 tablet by mouth every 8 (eight) hours as needed for severe pain.    Dispense:  10 tablet    Refill:  0    Order Specific Question:  Supervising Provider    Answer:  Vida Roller [address] Camprubi-Soms, PA-C 06/04/14 [id number]

## 2014-06-03 NOTE — ED Notes (Signed)
Multiple complaints. Chronic and acute. Ongoing for weeks and months. Vague descriptions. C/c is R groin pain, onset 1530. Pinpoints where cath was done in 2013. Reports mutiple procedures at this site. Rates 8/10. Area is unremarkable. No bruising, swelling, markings noted. CMS intact. ROM intact. Last ate 1800. Last BM 1830. (denies: nvd, fever, dizziness, visual changes, LOC, HA, weakness or sob), also reports top of head numb, "feels like blood at the top of my head is sloshing around back and forth and front to back when I move", this sx ongoing for months, also ear issues for years. Heart MD retired, next visit with new heart MD is in January. Pt has seen Garnette Scheuermann, MD here in [place] in the past. Pt here from [place].

## 2014-06-03 NOTE — ED Notes (Signed)
PA at bedside.

## 2014-06-03 NOTE — ED Notes (Signed)
Per [facility], pt staying with family. Pt presents with scalp pain, numbness to Right hand and Right foot, pain to Right groin area at the site where cardiac cath was performed in December 2013. BP originally 200/100 now 160/100, HR 66, RR 16, 20 G SL RH

## 2014-06-04 DIAGNOSIS — R109 Unspecified abdominal pain: Secondary | ICD-10-CM | POA: Diagnosis not present

## 2014-06-04 LAB — CBC WITH DIFFERENTIAL/PLATELET
BASOS ABS: 0.1 10*3/uL (ref 0.0–0.1)
BASOS PCT: 1 % (ref 0–1)
Eosinophils Absolute: 0.4 10*3/uL (ref 0.0–0.7)
Eosinophils Relative: 5 % (ref 0–5)
HCT: 39.4 % (ref 39.0–52.0)
HEMOGLOBIN: 13.3 g/dL (ref 13.0–17.0)
Lymphocytes Relative: 46 % (ref 12–46)
Lymphs Abs: 3.8 10*3/uL (ref 0.7–4.0)
MCH: 29.6 pg (ref 26.0–34.0)
MCHC: 33.8 g/dL (ref 30.0–36.0)
MCV: 87.6 fL (ref 78.0–100.0)
MONOS PCT: 6 % (ref 3–12)
Monocytes Absolute: 0.5 10*3/uL (ref 0.1–1.0)
NEUTROS ABS: 3.5 10*3/uL (ref 1.7–7.7)
NEUTROS PCT: 42 % — AB (ref 43–77)
Platelets: 236 10*3/uL (ref 150–400)
RBC: 4.5 MIL/uL (ref 4.22–5.81)
RDW: 12.8 % (ref 11.5–15.5)
WBC: 8.2 10*3/uL (ref 4.0–10.5)

## 2014-06-04 LAB — BASIC METABOLIC PANEL
ANION GAP: 11 (ref 5–15)
BUN: 10 mg/dL (ref 6–23)
CHLORIDE: 100 meq/L (ref 96–112)
CO2: 27 mEq/L (ref 19–32)
CREATININE: 0.77 mg/dL (ref 0.50–1.35)
Calcium: 9.5 mg/dL (ref 8.4–10.5)
GFR calc non Af Amer: >90 mL/min (ref >90)
Glucose, Bld: 109 mg/dL — ABNORMAL HIGH (ref 70–99)
Potassium: 4.3 mEq/L (ref 3.7–5.3)
Sodium: 138 mEq/L (ref 137–147)

## 2014-06-04 MED ORDER — OXYCODONE-ACETAMINOPHEN 5-325 MG PO TABS
1.0000 | ORAL_TABLET | Freq: Once | ORAL | Status: AC
  Administered 2014-06-04: 1 via ORAL
  Filled 2014-06-04: qty 1

## 2014-06-04 MED ORDER — OXYCODONE-ACETAMINOPHEN 5-325 MG PO TABS: 1.0000 | ORAL_TABLET | Freq: Three times a day (TID) | ORAL | Status: DC | PRN

## 2014-06-04 NOTE — ED Provider Notes (Signed)
Patient presents with chronic complaints of a headache for 6 months, feels as if fluid is shifting around in his head.  Also groin pain x 2 years that has gradually gotten worse.  Neurological exam is normal, there is no swelling in the groin or leg and he has good pulses.  No indication for imaging at this time.  Patient safe for DC.  Medical screening examination/treatment/procedure(s) were conducted as a shared visit with non-physician practitioner(s) and myself.  I personally evaluated the patient during the encounter.   EKG Interpretation None        Tomasita Crumble, MD 06/04/14 [id number]

## 2014-06-04 NOTE — Discharge Instructions (Signed)
Your groin pain is chronic and has been evaluated in the ER. Use percocet, tylenol and motrin for pain, as needed. See your regular doctor in 1 week, if you don't have a regular doctor find one using the resource guide below. Apply heat to the area daily, 20 minutes at a time every 4 hours, to help with pain. Return to the ER for changes or worsening symptoms.   Pain of Unknown Etiology (Pain Without a Known Cause) You have come to your caregiver because of pain. Pain can occur in any part of the body. Often there is not a definite cause. If your laboratory (blood or urine) work was normal and X-rays or other studies were normal, your caregiver may treat you without knowing the cause of the pain. An example of this is the headache. Most headaches are diagnosed by taking a history. This means your caregiver asks you questions about your headaches. Your caregiver determines a treatment based on your answers. Usually testing done for headaches is normal. Often testing is not done unless there is no response to medications. Regardless of where your pain is located today, you can be given medications to make you comfortable. If no physical cause of pain can be found, most cases of pain will gradually leave as suddenly as they came.  If you have a painful condition and no reason can be found for the pain, it is important that you follow up with your caregiver. If the pain becomes worse or does not go away, it may be necessary to repeat tests and look further for a possible cause.  Only take over-the-counter or prescription medicines for pain, discomfort, or fever as directed by your caregiver.  For the protection of your privacy, test results cannot be given over the phone. Make sure you receive the results of your test. Ask how these results are to be obtained if you have not been informed. It is your responsibility to obtain your test results.  You may continue all activities unless the activities cause more  pain. When the pain lessens, it is important to gradually resume normal activities. Resume activities by beginning slowly and gradually increasing the intensity and duration of the activities or exercise. During periods of severe pain, bed rest may be helpful. Lie or sit in any position that is comfortable.  Ice used for acute (sudden) conditions may be effective. Use a large plastic bag filled with ice and wrapped in a towel. This may provide pain relief.  See your caregiver for continued problems. Your caregiver can help or refer you for exercises or physical therapy if necessary. If you were given medications for your condition, do not drive, operate machinery or power tools, or sign legal documents for 24 hours. Do not drink alcohol, take sleeping pills, or take other medications that may interfere with treatment. See your caregiver immediately if you have pain that is becoming worse and not relieved by medications. Document Released: 06/17/2001 Document Revised: 07/13/2013 Document Reviewed: 09/22/2005 [facility] Patient Information 2015 [place], [place]. This information is not intended to replace advice given to you by your health care provider. Make sure you discuss any questions you have with your health care provider.  Hypertension Hypertension, commonly called high blood pressure, is when the force of blood pumping through your arteries is too strong. Your arteries are the blood vessels that carry blood from your heart throughout your body. A blood pressure reading consists of a higher number over a lower number, such as 110/72.  The higher number (systolic) is the pressure inside your arteries when your heart pumps. The lower number (diastolic) is the pressure inside your arteries when your heart relaxes. Ideally you want your blood pressure below 120/80. Hypertension forces your heart to work harder to pump blood. Your arteries may become narrow or stiff. Having hypertension puts you at risk for  heart disease, stroke, and other problems.  RISK FACTORS Some risk factors for high blood pressure are controllable. Others are not.  Risk factors you cannot control include:   Race. You may be at higher risk if you are African American.  Age. Risk increases with age.  Gender. Men are at higher risk than women before age 45 years. After age 2, women are at higher risk than men. Risk factors you can control include:  Not getting enough exercise or physical activity.  Being overweight.  Getting too much fat, sugar, calories, or salt in your diet.  Drinking too much alcohol. SIGNS AND SYMPTOMS Hypertension does not usually cause signs or symptoms. Extremely high blood pressure (hypertensive crisis) may cause headache, anxiety, shortness of breath, and nosebleed. DIAGNOSIS  To check if you have hypertension, your health care provider will measure your blood pressure while you are seated, with your arm held at the level of your heart. It should be measured at least twice using the same arm. Certain conditions can cause a difference in blood pressure between your right and left arms. A blood pressure reading that is higher than normal on one occasion does not mean that you need treatment. If one blood pressure reading is high, ask your health care provider about having it checked again. TREATMENT  Treating high blood pressure includes making lifestyle changes and possibly taking medicine. Living a healthy lifestyle can help lower high blood pressure. You may need to change some of your habits. Lifestyle changes may include:  Following the DASH diet. This diet is high in fruits, vegetables, and whole grains. It is low in salt, red meat, and added sugars.  Getting at least 2 hours of brisk physical activity every week.  Losing weight if necessary.  Not smoking.  Limiting alcoholic beverages.  Learning ways to reduce stress. If lifestyle changes are not enough to get your blood  pressure under control, your health care provider may prescribe medicine. You may need to take more than one. Work closely with your health care provider to understand the risks and benefits. HOME CARE INSTRUCTIONS  Have your blood pressure rechecked as directed by your health care provider.   Take medicines only as directed by your health care provider. Follow the directions carefully. Blood pressure medicines must be taken as prescribed. The medicine does not work as well when you skip doses. Skipping doses also puts you at risk for problems.   Do not smoke.   Monitor your blood pressure at home as directed by your health care provider. SEEK MEDICAL CARE IF:   You think you are having a reaction to medicines taken.  You have recurrent headaches or feel dizzy.  You have swelling in your ankles.  You have trouble with your vision. SEEK IMMEDIATE MEDICAL CARE IF:  You develop a severe headache or confusion.  You have unusual weakness, numbness, or feel faint.  You have severe chest or abdominal pain.  You vomit repeatedly.  You have trouble breathing. MAKE SURE YOU:   Understand these instructions.  Will watch your condition.  Will get help right away if you are not doing  well or get worse. Document Released: 09/22/2005 Document Revised: 02/06/2014 Document Reviewed: 07/15/2013 [facility] Patient Information 2015 [place], [place]. This information is not intended to replace advice given to you by your health care provider. Make sure you discuss any questions you have with your health care provider.

## 2014-09-14 ENCOUNTER — Encounter (HOSPITAL_COMMUNITY): Payer: Self-pay | Admitting: Interventional Cardiology

## 2015-06-14 DIAGNOSIS — I2510 Atherosclerotic heart disease of native coronary artery without angina pectoris: Secondary | ICD-10-CM | POA: Diagnosis not present

## 2015-06-14 DIAGNOSIS — Z955 Presence of coronary angioplasty implant and graft: Secondary | ICD-10-CM | POA: Diagnosis not present

## 2015-06-14 DIAGNOSIS — H349 Unspecified retinal vascular occlusion: Secondary | ICD-10-CM | POA: Diagnosis not present

## 2015-06-14 DIAGNOSIS — I10 Essential (primary) hypertension: Secondary | ICD-10-CM | POA: Diagnosis not present

## 2015-08-16 DIAGNOSIS — Z955 Presence of coronary angioplasty implant and graft: Secondary | ICD-10-CM | POA: Diagnosis not present

## 2015-08-16 DIAGNOSIS — I10 Essential (primary) hypertension: Secondary | ICD-10-CM | POA: Diagnosis not present

## 2015-08-16 DIAGNOSIS — I2510 Atherosclerotic heart disease of native coronary artery without angina pectoris: Secondary | ICD-10-CM | POA: Diagnosis not present

## 2016-01-10 DIAGNOSIS — I2510 Atherosclerotic heart disease of native coronary artery without angina pectoris: Secondary | ICD-10-CM | POA: Diagnosis not present

## 2016-01-10 DIAGNOSIS — Z955 Presence of coronary angioplasty implant and graft: Secondary | ICD-10-CM | POA: Diagnosis not present

## 2016-01-10 DIAGNOSIS — I10 Essential (primary) hypertension: Secondary | ICD-10-CM | POA: Diagnosis not present

## 2016-01-14 DIAGNOSIS — I371 Nonrheumatic pulmonary valve insufficiency: Secondary | ICD-10-CM | POA: Diagnosis not present

## 2016-01-14 DIAGNOSIS — R079 Chest pain, unspecified: Secondary | ICD-10-CM | POA: Diagnosis not present

## 2016-01-14 DIAGNOSIS — I2510 Atherosclerotic heart disease of native coronary artery without angina pectoris: Secondary | ICD-10-CM | POA: Diagnosis not present

## 2016-01-14 DIAGNOSIS — I071 Rheumatic tricuspid insufficiency: Secondary | ICD-10-CM | POA: Diagnosis not present

## 2016-01-16 DIAGNOSIS — H348322 Tributary (branch) retinal vein occlusion, left eye, stable: Secondary | ICD-10-CM | POA: Diagnosis not present

## 2016-01-16 DIAGNOSIS — H5211 Myopia, right eye: Secondary | ICD-10-CM | POA: Diagnosis not present

## 2016-01-16 DIAGNOSIS — H538 Other visual disturbances: Secondary | ICD-10-CM | POA: Diagnosis not present

## 2016-04-20 DIAGNOSIS — I119 Hypertensive heart disease without heart failure: Secondary | ICD-10-CM | POA: Diagnosis not present

## 2016-04-20 DIAGNOSIS — R079 Chest pain, unspecified: Secondary | ICD-10-CM | POA: Diagnosis not present

## 2016-04-20 DIAGNOSIS — I209 Angina pectoris, unspecified: Secondary | ICD-10-CM | POA: Diagnosis not present

## 2016-04-20 DIAGNOSIS — Z79899 Other long term (current) drug therapy: Secondary | ICD-10-CM | POA: Diagnosis not present

## 2016-04-20 DIAGNOSIS — I2510 Atherosclerotic heart disease of native coronary artery without angina pectoris: Secondary | ICD-10-CM | POA: Diagnosis not present

## 2016-04-20 DIAGNOSIS — E784 Other hyperlipidemia: Secondary | ICD-10-CM | POA: Diagnosis not present

## 2016-04-20 DIAGNOSIS — Z7982 Long term (current) use of aspirin: Secondary | ICD-10-CM | POA: Diagnosis not present

## 2016-04-20 DIAGNOSIS — I252 Old myocardial infarction: Secondary | ICD-10-CM | POA: Diagnosis not present

## 2016-04-20 DIAGNOSIS — F17200 Nicotine dependence, unspecified, uncomplicated: Secondary | ICD-10-CM | POA: Diagnosis not present

## 2016-04-20 DIAGNOSIS — I10 Essential (primary) hypertension: Secondary | ICD-10-CM | POA: Diagnosis not present

## 2016-04-20 DIAGNOSIS — R072 Precordial pain: Secondary | ICD-10-CM | POA: Diagnosis not present

## 2016-04-20 DIAGNOSIS — I25118 Atherosclerotic heart disease of native coronary artery with other forms of angina pectoris: Secondary | ICD-10-CM | POA: Diagnosis not present

## 2016-04-20 DIAGNOSIS — I25110 Atherosclerotic heart disease of native coronary artery with unstable angina pectoris: Secondary | ICD-10-CM | POA: Diagnosis not present

## 2016-04-20 DIAGNOSIS — E785 Hyperlipidemia, unspecified: Secondary | ICD-10-CM | POA: Diagnosis not present

## 2016-04-20 DIAGNOSIS — Z5329 Procedure and treatment not carried out because of patient's decision for other reasons: Secondary | ICD-10-CM | POA: Diagnosis not present

## 2016-04-20 DIAGNOSIS — Z955 Presence of coronary angioplasty implant and graft: Secondary | ICD-10-CM | POA: Diagnosis not present

## 2016-04-21 DIAGNOSIS — R0789 Other chest pain: Secondary | ICD-10-CM | POA: Diagnosis not present

## 2016-04-21 DIAGNOSIS — E784 Other hyperlipidemia: Secondary | ICD-10-CM | POA: Diagnosis not present

## 2016-04-21 DIAGNOSIS — I25118 Atherosclerotic heart disease of native coronary artery with other forms of angina pectoris: Secondary | ICD-10-CM | POA: Diagnosis not present

## 2016-04-21 DIAGNOSIS — I119 Hypertensive heart disease without heart failure: Secondary | ICD-10-CM | POA: Diagnosis not present

## 2016-04-22 DIAGNOSIS — I10 Essential (primary) hypertension: Secondary | ICD-10-CM | POA: Diagnosis not present

## 2016-04-22 DIAGNOSIS — E785 Hyperlipidemia, unspecified: Secondary | ICD-10-CM | POA: Diagnosis not present

## 2016-04-22 DIAGNOSIS — E784 Other hyperlipidemia: Secondary | ICD-10-CM | POA: Diagnosis not present

## 2016-04-22 DIAGNOSIS — I25118 Atherosclerotic heart disease of native coronary artery with other forms of angina pectoris: Secondary | ICD-10-CM | POA: Diagnosis not present

## 2016-04-22 DIAGNOSIS — Z79899 Other long term (current) drug therapy: Secondary | ICD-10-CM | POA: Diagnosis not present

## 2016-04-22 DIAGNOSIS — I2510 Atherosclerotic heart disease of native coronary artery without angina pectoris: Secondary | ICD-10-CM | POA: Diagnosis not present

## 2016-04-22 DIAGNOSIS — I25119 Atherosclerotic heart disease of native coronary artery with unspecified angina pectoris: Secondary | ICD-10-CM | POA: Diagnosis not present

## 2016-04-22 DIAGNOSIS — R079 Chest pain, unspecified: Secondary | ICD-10-CM | POA: Diagnosis not present

## 2016-04-22 DIAGNOSIS — I119 Hypertensive heart disease without heart failure: Secondary | ICD-10-CM | POA: Diagnosis not present

## 2016-04-22 DIAGNOSIS — I739 Peripheral vascular disease, unspecified: Secondary | ICD-10-CM | POA: Diagnosis not present

## 2016-04-22 DIAGNOSIS — I252 Old myocardial infarction: Secondary | ICD-10-CM | POA: Diagnosis not present

## 2016-06-16 DIAGNOSIS — M5412 Radiculopathy, cervical region: Secondary | ICD-10-CM | POA: Diagnosis not present

## 2016-06-16 DIAGNOSIS — M542 Cervicalgia: Secondary | ICD-10-CM | POA: Diagnosis not present

## 2016-06-16 DIAGNOSIS — R200 Anesthesia of skin: Secondary | ICD-10-CM | POA: Diagnosis not present

## 2016-06-20 DIAGNOSIS — E6609 Other obesity due to excess calories: Secondary | ICD-10-CM | POA: Diagnosis not present

## 2016-06-20 DIAGNOSIS — M545 Low back pain: Secondary | ICD-10-CM | POA: Diagnosis not present

## 2016-06-20 DIAGNOSIS — I10 Essential (primary) hypertension: Secondary | ICD-10-CM | POA: Diagnosis not present

## 2016-06-20 DIAGNOSIS — Z713 Dietary counseling and surveillance: Secondary | ICD-10-CM | POA: Diagnosis not present

## 2016-06-24 DIAGNOSIS — M5412 Radiculopathy, cervical region: Secondary | ICD-10-CM | POA: Diagnosis not present

## 2016-07-02 DIAGNOSIS — M546 Pain in thoracic spine: Secondary | ICD-10-CM | POA: Diagnosis not present

## 2016-07-02 DIAGNOSIS — G8929 Other chronic pain: Secondary | ICD-10-CM | POA: Diagnosis not present

## 2016-07-16 DIAGNOSIS — R200 Anesthesia of skin: Secondary | ICD-10-CM | POA: Diagnosis not present

## 2016-07-16 DIAGNOSIS — M5412 Radiculopathy, cervical region: Secondary | ICD-10-CM | POA: Diagnosis not present

## 2016-07-16 DIAGNOSIS — I10 Essential (primary) hypertension: Secondary | ICD-10-CM | POA: Diagnosis not present

## 2016-07-16 DIAGNOSIS — H538 Other visual disturbances: Secondary | ICD-10-CM | POA: Diagnosis not present

## 2016-07-16 DIAGNOSIS — R079 Chest pain, unspecified: Secondary | ICD-10-CM | POA: Diagnosis not present

## 2016-07-16 DIAGNOSIS — M79601 Pain in right arm: Secondary | ICD-10-CM | POA: Diagnosis not present

## 2016-07-16 DIAGNOSIS — I517 Cardiomegaly: Secondary | ICD-10-CM | POA: Diagnosis not present

## 2016-07-16 DIAGNOSIS — H2511 Age-related nuclear cataract, right eye: Secondary | ICD-10-CM | POA: Diagnosis not present

## 2016-07-16 DIAGNOSIS — E785 Hyperlipidemia, unspecified: Secondary | ICD-10-CM | POA: Diagnosis not present

## 2016-07-16 DIAGNOSIS — H348322 Tributary (branch) retinal vein occlusion, left eye, stable: Secondary | ICD-10-CM | POA: Diagnosis not present

## 2016-07-16 DIAGNOSIS — I2510 Atherosclerotic heart disease of native coronary artery without angina pectoris: Secondary | ICD-10-CM | POA: Diagnosis not present

## 2016-07-17 DIAGNOSIS — I10 Essential (primary) hypertension: Secondary | ICD-10-CM | POA: Diagnosis not present

## 2016-07-17 DIAGNOSIS — I2510 Atherosclerotic heart disease of native coronary artery without angina pectoris: Secondary | ICD-10-CM | POA: Diagnosis not present

## 2016-07-17 DIAGNOSIS — Z955 Presence of coronary angioplasty implant and graft: Secondary | ICD-10-CM | POA: Diagnosis not present

## 2016-08-26 DIAGNOSIS — R040 Epistaxis: Secondary | ICD-10-CM | POA: Diagnosis not present

## 2016-09-01 DIAGNOSIS — I10 Essential (primary) hypertension: Secondary | ICD-10-CM | POA: Diagnosis not present

## 2016-09-01 DIAGNOSIS — R040 Epistaxis: Secondary | ICD-10-CM | POA: Diagnosis not present

## 2017-04-21 DIAGNOSIS — I10 Essential (primary) hypertension: Secondary | ICD-10-CM | POA: Diagnosis not present

## 2017-04-21 DIAGNOSIS — I2510 Atherosclerotic heart disease of native coronary artery without angina pectoris: Secondary | ICD-10-CM | POA: Diagnosis not present

## 2017-04-21 DIAGNOSIS — Z955 Presence of coronary angioplasty implant and graft: Secondary | ICD-10-CM | POA: Diagnosis not present

## 2017-04-23 DIAGNOSIS — H348322 Tributary (branch) retinal vein occlusion, left eye, stable: Secondary | ICD-10-CM | POA: Diagnosis not present

## 2017-04-23 DIAGNOSIS — H2511 Age-related nuclear cataract, right eye: Secondary | ICD-10-CM | POA: Diagnosis not present

## 2017-04-23 DIAGNOSIS — Z0101 Encounter for examination of eyes and vision with abnormal findings: Secondary | ICD-10-CM | POA: Diagnosis not present

## 2018-02-18 DIAGNOSIS — H348322 Tributary (branch) retinal vein occlusion, left eye, stable: Secondary | ICD-10-CM | POA: Diagnosis not present

## 2018-02-18 DIAGNOSIS — I10 Essential (primary) hypertension: Secondary | ICD-10-CM | POA: Diagnosis not present

## 2018-02-18 DIAGNOSIS — H2511 Age-related nuclear cataract, right eye: Secondary | ICD-10-CM | POA: Diagnosis not present

## 2018-02-18 DIAGNOSIS — M792 Neuralgia and neuritis, unspecified: Secondary | ICD-10-CM | POA: Diagnosis not present

## 2018-02-18 DIAGNOSIS — I2510 Atherosclerotic heart disease of native coronary artery without angina pectoris: Secondary | ICD-10-CM | POA: Diagnosis not present

## 2018-02-18 DIAGNOSIS — Z955 Presence of coronary angioplasty implant and graft: Secondary | ICD-10-CM | POA: Diagnosis not present

## 2018-03-10 DIAGNOSIS — I2510 Atherosclerotic heart disease of native coronary artery without angina pectoris: Secondary | ICD-10-CM | POA: Diagnosis not present

## 2018-03-10 DIAGNOSIS — Z79899 Other long term (current) drug therapy: Secondary | ICD-10-CM | POA: Diagnosis not present

## 2018-03-10 DIAGNOSIS — R531 Weakness: Secondary | ICD-10-CM | POA: Diagnosis not present

## 2018-03-10 DIAGNOSIS — E785 Hyperlipidemia, unspecified: Secondary | ICD-10-CM | POA: Diagnosis not present

## 2018-03-10 DIAGNOSIS — R079 Chest pain, unspecified: Secondary | ICD-10-CM | POA: Diagnosis not present

## 2018-03-10 DIAGNOSIS — F17210 Nicotine dependence, cigarettes, uncomplicated: Secondary | ICD-10-CM | POA: Diagnosis not present

## 2018-03-10 DIAGNOSIS — R200 Anesthesia of skin: Secondary | ICD-10-CM | POA: Diagnosis not present

## 2018-03-10 DIAGNOSIS — Z7982 Long term (current) use of aspirin: Secondary | ICD-10-CM | POA: Diagnosis not present

## 2018-03-10 DIAGNOSIS — I6523 Occlusion and stenosis of bilateral carotid arteries: Secondary | ICD-10-CM | POA: Diagnosis not present

## 2018-03-11 DIAGNOSIS — I2510 Atherosclerotic heart disease of native coronary artery without angina pectoris: Secondary | ICD-10-CM | POA: Diagnosis not present

## 2018-03-11 DIAGNOSIS — E785 Hyperlipidemia, unspecified: Secondary | ICD-10-CM | POA: Diagnosis not present

## 2018-03-11 DIAGNOSIS — R079 Chest pain, unspecified: Secondary | ICD-10-CM | POA: Diagnosis not present

## 2018-03-11 DIAGNOSIS — R200 Anesthesia of skin: Secondary | ICD-10-CM | POA: Diagnosis not present

## 2018-04-22 DIAGNOSIS — Z79899 Other long term (current) drug therapy: Secondary | ICD-10-CM | POA: Diagnosis not present

## 2018-04-22 DIAGNOSIS — Z955 Presence of coronary angioplasty implant and graft: Secondary | ICD-10-CM | POA: Diagnosis not present

## 2018-04-22 DIAGNOSIS — I10 Essential (primary) hypertension: Secondary | ICD-10-CM | POA: Diagnosis not present

## 2018-04-22 DIAGNOSIS — I2510 Atherosclerotic heart disease of native coronary artery without angina pectoris: Secondary | ICD-10-CM | POA: Diagnosis not present

## 2019-10-02 ENCOUNTER — Encounter (HOSPITAL_COMMUNITY): Payer: Self-pay | Admitting: Emergency Medicine

## 2019-10-02 ENCOUNTER — Emergency Department (HOSPITAL_COMMUNITY)
Admission: EM | Admit: 2019-10-02 | Discharge: 2019-10-02 | Disposition: A | Discharge status: Home / Self Care | Payer: Medicare Other | Attending: Emergency Medicine | Admitting: Emergency Medicine

## 2019-10-02 ENCOUNTER — Emergency Department (HOSPITAL_COMMUNITY): Payer: Medicare Other

## 2019-10-02 ENCOUNTER — Other Ambulatory Visit: Payer: Self-pay

## 2019-10-02 DIAGNOSIS — R0789 Other chest pain: Secondary | ICD-10-CM | POA: Insufficient documentation

## 2019-10-02 DIAGNOSIS — F1410 Cocaine abuse, uncomplicated: Secondary | ICD-10-CM

## 2019-10-02 DIAGNOSIS — R0602 Shortness of breath: Secondary | ICD-10-CM | POA: Insufficient documentation

## 2019-10-02 DIAGNOSIS — F17210 Nicotine dependence, cigarettes, uncomplicated: Secondary | ICD-10-CM | POA: Diagnosis not present

## 2019-10-02 DIAGNOSIS — I10 Essential (primary) hypertension: Secondary | ICD-10-CM | POA: Diagnosis not present

## 2019-10-02 DIAGNOSIS — I2510 Atherosclerotic heart disease of native coronary artery without angina pectoris: Secondary | ICD-10-CM | POA: Insufficient documentation

## 2019-10-02 DIAGNOSIS — R070 Pain in throat: Secondary | ICD-10-CM | POA: Insufficient documentation

## 2019-10-02 DIAGNOSIS — R079 Chest pain, unspecified: Secondary | ICD-10-CM

## 2019-10-02 DIAGNOSIS — Z7982 Long term (current) use of aspirin: Secondary | ICD-10-CM | POA: Diagnosis not present

## 2019-10-02 DIAGNOSIS — Z79899 Other long term (current) drug therapy: Secondary | ICD-10-CM | POA: Diagnosis not present

## 2019-10-02 LAB — BASIC METABOLIC PANEL
Anion gap: 12 (ref 5–15)
BUN: 12 mg/dL (ref 6–20)
CO2: 29 mmol/L (ref 22–32)
Calcium: 9.4 mg/dL (ref 8.9–10.3)
Chloride: 101 mmol/L (ref 98–111)
Creatinine, Ser: 1.06 mg/dL (ref 0.61–1.24)
GFR calc Af Amer: >60 mL/min (ref >60)
GFR calc non Af Amer: >60 mL/min (ref >60)
Glucose, Bld: 101 mg/dL — ABNORMAL HIGH (ref 70–99)
Potassium: 3.1 mmol/L — ABNORMAL LOW (ref 3.5–5.1)
Sodium: 142 mmol/L (ref 135–145)

## 2019-10-02 LAB — RAPID URINE DRUG SCREEN, HOSP PERFORMED
Amphetamines: NOT DETECTED
Barbiturates: NOT DETECTED
Benzodiazepines: NOT DETECTED
Cocaine: POSITIVE — AB
Opiates: NOT DETECTED
Tetrahydrocannabinol: NOT DETECTED

## 2019-10-02 LAB — CBC
HCT: 42 % (ref 39.0–52.0)
Hemoglobin: 13.8 g/dL (ref 13.0–17.0)
MCH: 29.7 pg (ref 26.0–34.0)
MCHC: 32.9 g/dL (ref 30.0–36.0)
MCV: 90.5 fL (ref 80.0–100.0)
Platelets: 273 10*3/uL (ref 150–400)
RBC: 4.64 MIL/uL (ref 4.22–5.81)
RDW: 12.9 % (ref 11.5–15.5)
WBC: 9.8 10*3/uL (ref 4.0–10.5)
nRBC: 0 % (ref 0.0–0.2)

## 2019-10-02 LAB — TROPONIN I (HIGH SENSITIVITY)
Troponin I (High Sensitivity): 27 ng/L — ABNORMAL HIGH (ref <18)
Troponin I (High Sensitivity): 24 ng/L — ABNORMAL HIGH (ref <18)

## 2019-10-02 MED ORDER — CARVEDILOL 12.5 MG PO TABS: 12.5000 mg | ORAL_TABLET | Freq: Two times a day (BID) | ORAL | Status: DC

## 2019-10-02 MED ORDER — CLONIDINE HCL 0.1 MG PO TABS
0.1000 mg | ORAL_TABLET | Freq: Once | ORAL | Status: AC
  Administered 2019-10-02: 0.1 mg via ORAL
  Filled 2019-10-02: qty 1

## 2019-10-02 MED ORDER — AMLODIPINE BESYLATE 5 MG PO TABS
5.0000 mg | ORAL_TABLET | Freq: Once | ORAL | Status: AC
  Administered 2019-10-02: 11:00:00 5 mg via ORAL
  Filled 2019-10-02: qty 1

## 2019-10-02 MED ORDER — SODIUM CHLORIDE 0.9% FLUSH: 3.0000 mL | Freq: Once | INTRAVENOUS | Status: DC

## 2019-10-02 NOTE — ED Notes (Addendum)
Pt unable to provide a urine specimen at this time

## 2019-10-02 NOTE — ED Provider Notes (Signed)
[facility] EMERGENCY DEPARTMENT Provider Note   CSN: [id number] Arrival date & time: 10/02/19  5397     History Chief Complaint  Patient presents with  . Chest Pain    Cody [name] is a 58 y.o. male.  Patient with history of CAD (stents placed in 2013 and 2015), HTN, PAD, HLD, cocaine abuse, presents with central chest pain, intermittent, sometimes radiating to left lateral chest, associated with SOB. Symptoms started last week and have been worsening. He was awakened last night with pain that has been persistent since. He reports it feels like "something sitting on his chest" and is similar to prior episodes where stenting was done. Episodes can last minutes or hours at a time. He also reports hot/cold chills, sore throat and congestion. He has an infrequent cough. He is a smoker. He has a history of cocaine abuse and admits to cocaine use "last week at a party".   The history is provided by the patient. No language interpreter was used.  Chest Pain Associated symptoms: shortness of breath   Associated symptoms: no abdominal pain, no fever, no nausea and no vomiting        Past Medical History:  Diagnosis Date  . Cocaine abuse (HCC) 09/20/2012  . Coronary artery disease   . Dyslipidemia 09/20/2012  . GERD (gastroesophageal reflux disease)    has stomach ulcers a while back  . HTN (hypertension)   . PAD (peripheral artery disease) (HCC) 09/20/2012  . Smoker 09/20/2012    Patient Active Problem List   Diagnosis Date Noted  . Chest pain 09/24/2012    Class: Acute  . Retinal vein occlusion 09/24/2012  . Constipation 09/22/2012  . Visual field defect 09/21/2012  . HTN (hypertension) 09/20/2012  . Smoker 09/20/2012  . PAD (peripheral artery disease) (HCC) 09/20/2012  . Cocaine abuse (HCC) 09/20/2012  . Dyslipidemia 09/20/2012  . Coronary artery disease     Past Surgical History:  Procedure Laterality Date  . CORONARY STENT PLACEMENT    . LEFT  HEART CATHETERIZATION WITH CORONARY ANGIOGRAM N/A 09/24/2012   Procedure: LEFT HEART CATHETERIZATION WITH CORONARY ANGIOGRAM;  Surgeon: Lesleigh Noe, MD;  Location: [facility] CATH LAB;  Service: Cardiovascular;  Laterality: N/A;       Family History  Problem Relation Age of Onset  . CAD Father   . CAD Brother   . Hypertension Mother     Social History   Tobacco Use  . Smoking status: Current Every Day Smoker    Types: Cigarettes  . Smokeless tobacco: Never Used  Substance Use Topics  . Alcohol use: Yes    Alcohol/week: 3.0 - 4.0 standard drinks    Types: 2 Cans of beer, 1 - 2 Shots of liquor per week  . Drug use: Yes    Types: Cocaine    Home Medications Prior to Admission medications   Medication Sig Start Date End Date Taking? Authorizing Provider  acetaminophen-codeine (TYLENOL #3) 300-30 MG per tablet Take 1-2 tablets by mouth every 4 (four) hours as needed for moderate pain.    [provider]  amLODipine (NORVASC) 5 MG tablet Take 1 tablet (5 mg total) by mouth daily. 09/24/12   Renae Fickle, MD  aspirin EC 81 MG tablet Take 81 mg by mouth daily.    [provider]  carvedilol (COREG) 12.5 MG tablet Take 1 tablet (12.5 mg total) by mouth 2 (two) times daily. 09/24/12   Renae Fickle, MD  cloNIDine (CATAPRES) 0.1 MG  tablet Take 1 tablet (0.1 mg total) by mouth 3 (three) times daily. 09/24/12   Renae FickleShort, Mackenzie, MD  flunisolide (NASALIDE) 25 MCG/ACT (0.025%) SOLN Inhale 2 sprays into the lungs 2 (two) times daily. 09/24/12   Renae FickleShort, Mackenzie, MD  omeprazole (PRILOSEC) 40 MG capsule Take 40 mg by mouth daily.    [provider]  oxyCODONE-acetaminophen (PERCOCET) 5-325 MG per tablet Take 1 tablet by mouth every 8 (eight) hours as needed for severe pain. 06/04/14   Street, Mercedes, PA-C  ranitidine (ZANTAC) 150 MG tablet Take 1 tablet (150 mg total) by mouth 2 (two) times daily. 09/24/12   Renae FickleShort, Mackenzie, MD  simvastatin (ZOCOR) 40 MG tablet  Take 40 mg by mouth daily.    [provider]    Allergies    Contrast media [iodinated diagnostic agents], Peanuts [peanut oil], and Plavix [clopidogrel]  Review of Systems   Review of Systems  Constitutional: Positive for chills. Negative for fever.  HENT: Positive for congestion and sore throat.   Respiratory: Positive for shortness of breath.   Cardiovascular: Positive for chest pain.  Gastrointestinal: Negative.  Negative for abdominal pain, nausea and vomiting.  Musculoskeletal: Negative.   Skin: Negative.   Neurological: Negative.     Physical Exam Updated Vital Signs BP (!) 198/108   Pulse 76   Temp 98.9 F (37.2 C) (Oral)   Resp 16   SpO2 98%   Physical Exam Vitals and nursing note reviewed.  Constitutional:      Appearance: He is well-developed.  HENT:     Head: Normocephalic.  Cardiovascular:     Rate and Rhythm: Normal rate and regular rhythm.     Heart sounds: No murmur.  Pulmonary:     Effort: Pulmonary effort is normal.     Breath sounds: Normal breath sounds. No wheezing, rhonchi or rales.  Chest:     Chest wall: Tenderness (Central chest tenderness to palpation.) present.  Abdominal:     General: Bowel sounds are normal.     Palpations: Abdomen is soft.     Tenderness: There is no abdominal tenderness. There is no guarding or rebound.  Musculoskeletal:        General: Normal range of motion.     Cervical back: Normal range of motion and neck supple.     Right lower leg: No edema.     Left lower leg: No edema.  Skin:    General: Skin is warm and dry.     Findings: No rash.  Neurological:     Mental Status: He is alert and oriented to person, place, and time.     ED Results / Procedures / Treatments   Labs (all labs ordered are listed, but only abnormal results are displayed) Labs Reviewed  BASIC METABOLIC PANEL - Abnormal; Notable for the following components:      Result Value   Potassium 3.1 (*)    Glucose, Bld 101 (*)     All other components within normal limits  TROPONIN I (HIGH SENSITIVITY) - Abnormal; Notable for the following components:   Troponin I (High Sensitivity) 27 (*)    All other components within normal limits  CBC  RAPID URINE DRUG SCREEN, HOSP PERFORMED    EKG EKG Interpretation  Date/Time:  Sunday October 02 2019 08:29:10 EST Ventricular Rate:  84 PR Interval:  136 QRS Duration: 84 QT Interval:  392 QTC Calculation: 463 R Axis:   14 Text Interpretation: Normal sinus rhythm Minimal voltage criteria for LVH,  may be normal variant ( Sokolow-Lyon ) Nonspecific T wave abnormality Prolonged QT Abnormal ECG similar to prior 12/13 Confirmed by Aletta Edouard [phone]) on 10/02/2019 9:25:16 AM   Radiology DG Chest 2 View  Result Date: 10/02/2019 CLINICAL DATA:  Central chest pressure and shortness of breath this morning. EXAM: CHEST - 2 VIEW COMPARISON:  Radiographs 01/20/2019 and 03/10/2018. FINDINGS: The heart size and mediastinal contours are normal. The lungs are clear. There is no pleural effusion or pneumothorax. No acute osseous findings are identified. IMPRESSION: No active cardiopulmonary process. Electronically Signed   By: Richardean Sale M.D.   On: 10/02/2019 08:59    Procedures Procedures (including critical care time)  Medications Ordered in ED Medications  sodium chloride flush (NS) 0.9 % injection 3 mL (has no administration in time range)    ED Course  I have reviewed the triage vital signs and the nursing notes.  Pertinent labs & imaging results that were available during my care of the patient were reviewed by me and considered in my medical decision making (see chart for details).    MDM Rules/Calculators/A&P                      Patient to ED with symptoms of chest "pressure" type pain, SOB, but also hot/cold chills, sore throat, minimal cough.  He is well appearing. Blood pressure is significantly elevated. Patient is a rather vague historian and states he  takes his medications like they are prescribed, but then states he did not take them yesterday. Will provide doses of his regular blood pressure medication and observe.  EKG is unchanged from previous and his no ischemia. Initial troponin is mildly elevated to 27. Delta trop pending.   The patient has voiced his desire to have a cardiac catheterization today "because I know its my heart".   UDS pending.   UDS positive for cocaine. Blood pressure trending down after patient received his usual medications. Discussed his tests, EKG, CXR all indicate that he should be able to go home today and follow up with cardiology in the outpatient setting. The patient is not happy with that plan. Will discuss with cardiology at patient's request.  Dr. Sallyanne Kuster, cardiology, to review the chart and discuss coordination of care.   Per Dr. Sallyanne Kuster, he has reviewed the chart, including history, past therapeutics and interventions, current symptoms and current ED evaluation. He will provide close follow up in the office for further evaluation.  Patient updated on plan of care. Will provide ibuprofen for chest pain and encouraged compliance with medications and outpatient follow up.  Final Clinical Impression(s) / ED Diagnoses Final diagnoses:  None   1. Nonspecific chest pain 2. Cocaine abuse  Rx / DC Orders ED Discharge Orders    None       Charlann Lange, Hershal Coria 10/02/19 1322    Hayden Rasmussen, MD 10/02/19 [phone]

## 2019-10-02 NOTE — Discharge Instructions (Addendum)
It is important that you take your medications as prescribed to control your blood pressure and cholesterol.   Call the [place] office to schedule an appointment for outpatient follow up.

## 2019-10-02 NOTE — ED Triage Notes (Signed)
C/o pressure to center of chest and SOB that woke him up at 7am.  States he has had intermittent chest pain x 2-3 days.

## 2019-10-02 NOTE — ED Notes (Addendum)
Patient verbalizes understanding of discharge instructions. Opportunity for questioning and answers were provided. Armband removed by staff, pt discharged from ED.    Pt refused to sign discharge.

## 2019-10-03 ENCOUNTER — Telehealth: Payer: Self-pay | Admitting: Nurse Practitioner

## 2019-10-03 NOTE — Telephone Encounter (Signed)
Per staff message from [place], called patient to schedule appt with one of the providers at [facility] LVM for patient to return call to schedule

## 2019-10-10 NOTE — Telephone Encounter (Signed)
LVM for patient to call and schedule appt per CBerge with NEW [facility] doc

## 2019-10-13 NOTE — Telephone Encounter (Signed)
LVM for patient to call and schedule appt with one of our cardiologists, ER follow up per C.Berge

## 2019-11-16 ENCOUNTER — Ambulatory Visit (INDEPENDENT_AMBULATORY_CARE_PROVIDER_SITE_OTHER): Payer: Medicare Other | Admitting: Cardiology

## 2019-11-16 ENCOUNTER — Other Ambulatory Visit: Payer: Self-pay

## 2019-11-16 ENCOUNTER — Encounter: Payer: Self-pay | Admitting: Cardiology

## 2019-11-16 VITALS — BP 140/86 | HR 78 | Ht 66.0 in | Wt 182.0 lb

## 2019-11-16 DIAGNOSIS — I25119 Atherosclerotic heart disease of native coronary artery with unspecified angina pectoris: Secondary | ICD-10-CM | POA: Diagnosis not present

## 2019-11-16 DIAGNOSIS — E782 Mixed hyperlipidemia: Secondary | ICD-10-CM | POA: Diagnosis not present

## 2019-11-16 DIAGNOSIS — I10 Essential (primary) hypertension: Secondary | ICD-10-CM

## 2019-11-16 MED ORDER — RANOLAZINE ER 500 MG PO TB12: 500.0000 mg | ORAL_TABLET | Freq: Two times a day (BID) | ORAL | 1 refills | Status: DC

## 2019-11-16 MED ORDER — CLONIDINE HCL 0.1 MG PO TABS: 0.1000 mg | ORAL_TABLET | Freq: Three times a day (TID) | ORAL | 1 refills | Status: DC

## 2019-11-16 MED ORDER — AMLODIPINE BESYLATE 10 MG PO TABS: 10.0000 mg | ORAL_TABLET | Freq: Every day | ORAL | 1 refills | Status: DC

## 2019-11-16 MED ORDER — ASPIRIN EC 81 MG PO TBEC: 81.0000 mg | DELAYED_RELEASE_TABLET | Freq: Every day | ORAL | 1 refills | Status: DC

## 2019-11-16 NOTE — Patient Instructions (Signed)
Medication Instructions:  Your physician has recommended you make the following change in your medication:   TAKE: Coreg(carvedilol) 12.5 mg TAke 1 tab twice daily  START: Ranexa(ranolazine) 500 mg Take 1 tab twice daily    *If you need a refill on your cardiac medications before your next appointment, please call your pharmacy*  Lab Work: None If you have labs (blood work) drawn today and your tests are completely normal, you will receive your results only by: Marland Kitchen MyChart Message (if you have MyChart) OR . A paper copy in the mail If you have any lab test that is abnormal or we need to change your treatment, we will call you to review the results.  Testing/Procedures: Your physician has requested that you have an echocardiogram. Echocardiography is a painless test that uses sound waves to create images of your heart. It provides your doctor with information about the size and shape of your heart and how well your heart's chambers and valves are working. This procedure takes approximately one hour. There are no restrictions for this procedure.    Follow-Up: At [facility], you and your health needs are our priority.  As part of our continuing mission to provide you with exceptional heart care, we have created designated Provider Care Teams.  These Care Teams include your primary Cardiologist (physician) and Advanced Practice Providers (APPs -  Physician Assistants and Nurse Practitioners) who all work together to provide you with the care you need, when you need it.  Your next appointment:   1 month(s)  The format for your next appointment:   In Person  Provider:   Thomasene Ripple, DO  Other Instructions

## 2019-11-16 NOTE — Progress Notes (Signed)
Cardiology Office Note:    Date:  11/16/2019   ID:  Cody [name], DOB 1961-08-21, MRN [id number]  PCP:  Ailene Ravel, MD  Cardiologist:  Thomasene Ripple, DO  Electrophysiologist:  None   Referring MD: Ailene Ravel, MD   Chief Complaint  Patient presents with  . Chest Pain    History of Present Illness:    Cody [name] is a 59 y.o. male with a hx of coronary artery disease status post stents his most recent left heart catheterization in July 2017 at which time he was noted to have 25% LAD stenosis in the proximal portion of the LAD, proximal left circumflex with 20% with mid and distal RCA both having 25% lesions.  His previous RCA stents were patent.  Other medical history includes dyslipidemia, history of cocaine abuse, hypertension, PAD and tobacco use.  The patient presents to be evaluated for chest and.  He tells me this is a left-sided intermittent burning sensation which he started to feel for several weeks now.  He notes that it last for few seconds prior to resolving.  He has taken nitro which has helped.  But once the onset of the pain he was diagnosed with pneumonia and has since been treated for it.  The pain has resolved some but is still intermittent.  He denies shortness of breath, nausea, vomiting.  No other complaints at this time.  Past Medical History:  Diagnosis Date  . Cocaine abuse (HCC) 09/20/2012  . Coronary artery disease   . Dyslipidemia 09/20/2012  . GERD (gastroesophageal reflux disease)    has stomach ulcers a while back  . HTN (hypertension)   . PAD (peripheral artery disease) (HCC) 09/20/2012  . Smoker 09/20/2012    Past Surgical History:  Procedure Laterality Date  . CORONARY STENT PLACEMENT    . LEFT HEART CATHETERIZATION WITH CORONARY ANGIOGRAM N/A 09/24/2012   Procedure: LEFT HEART CATHETERIZATION WITH CORONARY ANGIOGRAM;  Surgeon: Lesleigh Noe, MD;  Location: [facility] CATH LAB;  Service: Cardiovascular;  Laterality: N/A;    Current  Medications: Current Meds  Medication Sig  . amLODipine (NORVASC) 10 MG tablet Take 1 tablet (10 mg total) by mouth daily.  Marland Kitchen aspirin EC 81 MG tablet Take 1 tablet (81 mg total) by mouth daily.  Marland Kitchen atorvastatin (LIPITOR) 40 MG tablet Take 40 mg by mouth daily.  . carvedilol (COREG) 12.5 MG tablet Take 1 tablet (12.5 mg total) by mouth 2 (two) times daily.  . cloNIDine (CATAPRES) 0.1 MG tablet Take 1 tablet (0.1 mg total) by mouth 3 (three) times daily.  . fluticasone (FLONASE) 50 MCG/ACT nasal spray 1 spray by Each Nare route daily.  . Multiple Vitamins-Minerals (MULTIVITAMIN WITH MINERALS) tablet Take 1 tablet by mouth daily.  . naproxen sodium (ALEVE) 220 MG tablet Take 220 mg by mouth.  . nitroGLYCERIN (NITROSTAT) 0.4 MG SL tablet Place 0.4 mg under the tongue every 5 (five) minutes as needed for chest pain.  . pantoprazole (PROTONIX) 40 MG tablet Take 40 mg by mouth daily.  . [DISCONTINUED] amLODipine (NORVASC) 10 MG tablet Take 10 mg by mouth daily.  . [DISCONTINUED] aspirin EC 81 MG tablet Take 81 mg by mouth daily.  . [DISCONTINUED] cloNIDine (CATAPRES) 0.1 MG tablet Take 1 tablet (0.1 mg total) by mouth 3 (three) times daily.     Allergies:   Contrast media [iodinated diagnostic agents], Peanuts [peanut oil], and Plavix [clopidogrel]   Social History   Socioeconomic History  . Marital status:  Single    Spouse name: Not on file  . Number of children: Not on file  . Years of education: Not on file  . Highest education level: Not on file  Occupational History  . Not on file  Tobacco Use  . Smoking status: Current Every Day Smoker    Types: Cigarettes  . Smokeless tobacco: Never Used  Substance and Sexual Activity  . Alcohol use: Yes    Alcohol/week: 3.0 - 4.0 standard drinks    Types: 2 Cans of beer, 1 - 2 Shots of liquor per week  . Drug use: Yes    Types: Cocaine  . Sexual activity: Not on file  Other Topics Concern  . Not on file  Social History Narrative  . Not on  file   Social Determinants of Health   Financial Resource Strain:   . Difficulty of Paying Living Expenses: Not on file  Food Insecurity:   . Worried About Programme researcher, broadcasting/film/video in the Last Year: Not on file  . Ran Out of Food in the Last Year: Not on file  Transportation Needs:   . Lack of Transportation (Medical): Not on file  . Lack of Transportation (Non-Medical): Not on file  Physical Activity:   . Days of Exercise per Week: Not on file  . Minutes of Exercise per Session: Not on file  Stress:   . Feeling of Stress : Not on file  Social Connections:   . Frequency of Communication with Friends and Family: Not on file  . Frequency of Social Gatherings with Friends and Family: Not on file  . Attends Religious Services: Not on file  . Active Member of Clubs or Organizations: Not on file  . Attends Banker Meetings: Not on file  . Marital Status: Not on file     Family History: The patient's family history includes CAD in his brother; Diabetes in his father; Hypertension in his mother.  ROS:   Review of Systems  Constitution: Negative for decreased appetite, fever and weight gain.  HENT: Negative for congestion, ear discharge, hoarse voice and sore throat.   Eyes: Negative for discharge, redness, vision loss in right eye and visual halos.  Cardiovascular: Reports chest pain.  Negative for dyspnea on exertion, leg swelling, orthopnea and palpitations.  Respiratory: Negative for cough, hemoptysis, shortness of breath and snoring.   Endocrine: Negative for heat intolerance and polyphagia.  Hematologic/Lymphatic: Negative for bleeding problem. Does not bruise/bleed easily.  Skin: Negative for flushing, nail changes, rash and suspicious lesions.  Musculoskeletal: Negative for arthritis, joint pain, muscle cramps, myalgias, neck pain and stiffness.  Gastrointestinal: Negative for abdominal pain, bowel incontinence, diarrhea and excessive appetite.  Genitourinary: Negative  for decreased libido, genital sores and incomplete emptying.  Neurological: Negative for brief paralysis, focal weakness, headaches and loss of balance.  Psychiatric/Behavioral: Negative for altered mental status, depression and suicidal ideas.  Allergic/Immunologic: Negative for HIV exposure and persistent infections.    EKGs/Labs/Other Studies Reviewed:    The following studies were reviewed today:   EKG:  The ekg ordered today demonstrates sinus rhythm, heart rate 73 bpm, T wave abnormalities which could be suggestive of inferolateral wall ischemia.  Compared to EKG done on 10/04/2019 no significant change.  Echocardiogram done in the hospital on March 10, 2018 showed normal left ventricular systolic ejection fraction 60 to 65%, normal left atrial size.  Normal right atrial size.  Normal aortic valve.  Trace mitral regurgitation trace tricuspid regurgitation.   Recent  Labs: 10/02/2019: BUN 12; Creatinine, Ser 1.06; Hemoglobin 13.8; Platelets 273; Potassium 3.1; Sodium 142  Recent Lipid Panel No results found for: CHOL, TRIG, HDL, CHOLHDL, VLDL, LDLCALC, LDLDIRECT  Physical Exam:    VS:  BP 140/86 (BP Location: Left Arm, Patient Position: Sitting, Cuff Size: Normal)   Pulse 78   Ht 5\' 6"  (1.676 m)   Wt 182 lb (82.6 kg)   SpO2 96%   BMI 29.38 kg/m     Wt Readings from Last 3 Encounters:  11/16/19 182 lb (82.6 kg)  09/23/12 183 lb 3.2 oz (83.1 kg)     GEN: Well nourished, well developed in no acute distress HEENT: Normal NECK: No JVD; No carotid bruits LYMPHATICS: No lymphadenopathy CARDIAC: S1S2 noted,RRR, no murmurs, rubs, gallops RESPIRATORY:  Clear to auscultation without rales, wheezing or rhonchi  ABDOMEN: Soft, non-tender, non-distended, +bowel sounds, no guarding. EXTREMITIES: No edema, No cyanosis, no clubbing MUSCULOSKELETAL:  No deformity  SKIN: Warm and dry NEUROLOGIC:  Alert and oriented x 3, non-focal PSYCHIATRIC:  Normal affect, good insight  ASSESSMENT:      1. Coronary artery disease with angina pectoris, unspecified vessel or lesion type, unspecified whether native or transplanted heart ([place])   2. Essential hypertension   3. Mixed hyperlipidemia    PLAN:    At this time I am going to start the patient on Ranexa 500 mg twice daily I have to suspect this is chronic stable angina. I have educated the patient about this medication- all of his questions were answered during the visit.    In addition echocardiogram will be done to reassess his LV function and for any wall motion abnormalities.  Blood pressure is not well controlled in the office today he tells me that he takes 6.25 mg carvedilol and going to increase that to 12.5 mg twice daily for a blood pressure target of less than 130/68mmHG.  Medication refills were also sent to the pharmacy.  The patient is in agreement with the above plan. The patient left the office in stable condition.  The patient will follow up in 1 month or sooner if needed.  Total visit time 40 minutes.  Medication Adjustments/Labs and Tests Ordered: Current medicines are reviewed at length with the patient today.  Concerns regarding medicines are outlined above.  Orders Placed This Encounter  Procedures  . EKG 12-Lead  . ECHOCARDIOGRAM COMPLETE   Meds ordered this encounter  Medications  . ranolazine (RANEXA) 500 MG 12 hr tablet    Sig: Take 1 tablet (500 mg total) by mouth 2 (two) times daily.    Dispense:  180 tablet    Refill:  1  . aspirin EC 81 MG tablet    Sig: Take 1 tablet (81 mg total) by mouth daily.    Dispense:  90 tablet    Refill:  1  . amLODipine (NORVASC) 10 MG tablet    Sig: Take 1 tablet (10 mg total) by mouth daily.    Dispense:  90 tablet    Refill:  1  . cloNIDine (CATAPRES) 0.1 MG tablet    Sig: Take 1 tablet (0.1 mg total) by mouth 3 (three) times daily.    Dispense:  270 tablet    Refill:  1    Patient Instructions  Medication Instructions:  Your physician has  recommended you make the following change in your medication:   TAKE: Coreg(carvedilol) 12.5 mg TAke 1 tab twice daily  START: Ranexa(ranolazine) 500 mg Take 1 tab twice daily    *  If you need a refill on your cardiac medications before your next appointment, please call your pharmacy*  Lab Work: None If you have labs (blood work) drawn today and your tests are completely normal, you will receive your results only by: Marland Kitchen MyChart Message (if you have MyChart) OR . A paper copy in the mail If you have any lab test that is abnormal or we need to change your treatment, we will call you to review the results.  Testing/Procedures: Your physician has requested that you have an echocardiogram. Echocardiography is a painless test that uses sound waves to create images of your heart. It provides your doctor with information about the size and shape of your heart and how well your heart's chambers and valves are working. This procedure takes approximately one hour. There are no restrictions for this procedure.    Follow-Up: At [facility], you and your health needs are our priority.  As part of our continuing mission to provide you with exceptional heart care, we have created designated Provider Care Teams.  These Care Teams include your primary Cardiologist (physician) and Advanced Practice Providers (APPs -  Physician Assistants and Nurse Practitioners) who all work together to provide you with the care you need, when you need it.  Your next appointment:   1 month(s)  The format for your next appointment:   In Person  Provider:   Thomasene Ripple, DO  Other Instructions      Adopting a Healthy Lifestyle.  Know what a healthy weight is for you (roughly BMI <25) and aim to maintain this   Aim for 7+ servings of fruits and vegetables daily   65-80+ fluid ounces of water or unsweet tea for healthy kidneys   Limit to max 1 drink of alcohol per day; avoid smoking/tobacco   Limit animal  fats in diet for cholesterol and heart health - choose grass fed whenever available   Avoid highly processed foods, and foods high in saturated/trans fats   Aim for low stress - take time to unwind and care for your mental health   Aim for 150 min of moderate intensity exercise weekly for heart health, and weights twice weekly for bone health   Aim for 7-9 hours of sleep daily   When it comes to diets, agreement about the perfect plan isnt easy to find, even among the experts. Experts at the [facility] of [employer] developed an idea known as the Healthy Eating Plate. Just imagine a plate divided into logical, healthy portions.   The emphasis is on diet quality:   Load up on vegetables and fruits - one-half of your plate: Aim for color and variety, and remember that potatoes dont count.   Go for whole grains - one-quarter of your plate: Whole wheat, barley, wheat berries, quinoa, oats, brown rice, and foods made with them. If you want pasta, go with whole wheat pasta.   Protein power - one-quarter of your plate: Fish, chicken, beans, and nuts are all healthy, versatile protein sources. Limit red meat.   The diet, however, does go beyond the plate, offering a few other suggestions.   Use healthy plant oils, such as olive, canola, soy, corn, sunflower and peanut. Check the labels, and avoid partially hydrogenated oil, which have unhealthy trans fats.   If youre thirsty, drink water. Coffee and tea are good in moderation, but skip sugary drinks and limit milk and dairy products to one or two daily servings.   The type of  carbohydrate in the diet is more important than the amount. Some sources of carbohydrates, such as vegetables, fruits, whole grains, and beans-are healthier than others.   Finally, stay active  Signed, Thomasene Ripple, DO  11/16/2019 8:41 PM    [facility]

## 2019-12-29 ENCOUNTER — Other Ambulatory Visit: Payer: Self-pay

## 2019-12-29 ENCOUNTER — Encounter (INDEPENDENT_AMBULATORY_CARE_PROVIDER_SITE_OTHER): Payer: Self-pay

## 2019-12-29 ENCOUNTER — Ambulatory Visit (INDEPENDENT_AMBULATORY_CARE_PROVIDER_SITE_OTHER): Payer: Medicare Other

## 2019-12-29 DIAGNOSIS — I25119 Atherosclerotic heart disease of native coronary artery with unspecified angina pectoris: Secondary | ICD-10-CM

## 2019-12-29 NOTE — Progress Notes (Deleted)
Complete echocardiogram has been performed.  Jimmy Gregary Blackard RDCS, RVT 

## 2019-12-29 NOTE — Progress Notes (Signed)
Complete echocardiogram has been performed.  Jimmy Lynzi Meulemans RDCS, RVT 

## 2019-12-30 ENCOUNTER — Ambulatory Visit (INDEPENDENT_AMBULATORY_CARE_PROVIDER_SITE_OTHER): Payer: Medicare Other | Admitting: Cardiology

## 2019-12-30 ENCOUNTER — Encounter (INDEPENDENT_AMBULATORY_CARE_PROVIDER_SITE_OTHER): Payer: Self-pay

## 2019-12-30 ENCOUNTER — Encounter: Payer: Self-pay | Admitting: Cardiology

## 2019-12-30 VITALS — BP 118/78 | HR 73 | Ht 66.0 in | Wt 178.0 lb

## 2019-12-30 DIAGNOSIS — E785 Hyperlipidemia, unspecified: Secondary | ICD-10-CM

## 2019-12-30 DIAGNOSIS — I2510 Atherosclerotic heart disease of native coronary artery without angina pectoris: Secondary | ICD-10-CM | POA: Diagnosis not present

## 2019-12-30 DIAGNOSIS — I10 Essential (primary) hypertension: Secondary | ICD-10-CM

## 2019-12-30 NOTE — Progress Notes (Signed)
Cardiology Office Note:    Date:  12/30/2019   ID:  Cody [name], DOB 11/18/1960, MRN [id number]  PCP:  Ailene Ravel, MD  Cardiologist:  Thomasene Ripple, DO  Electrophysiologist:  None   Referring MD: Ailene Ravel, MD   Chief Complaint  Patient presents with  . Follow-up    History of Present Illness:    Cody [name] is a 59 y.o. male with a hx of coronary disease, dyslipidemia, hypertension, PAD and tobacco use presents today for follow-up visit.  Last saw the patient on 11/16/2019 at that time he was reported that he was experiencing chest pain.  Concern for angina I started patient on Ranexa 500 mg twice a day.  He was also hypertensive so I increase his carvedilol from 6.25 mg twice a day to 12.5 mg twice daily.  Since that day he tells me that he has been doing well.  He offers no complaints at this time.  Past Medical History:  Diagnosis Date  . Cocaine abuse (HCC) 09/20/2012  . Coronary artery disease   . Dyslipidemia 09/20/2012  . GERD (gastroesophageal reflux disease)    has stomach ulcers a while back  . HTN (hypertension)   . PAD (peripheral artery disease) (HCC) 09/20/2012  . Smoker 09/20/2012    Past Surgical History:  Procedure Laterality Date  . CORONARY STENT PLACEMENT    . LEFT HEART CATHETERIZATION WITH CORONARY ANGIOGRAM N/A 09/24/2012   Procedure: LEFT HEART CATHETERIZATION WITH CORONARY ANGIOGRAM;  Surgeon: Lesleigh Noe, MD;  Location: [facility] CATH LAB;  Service: Cardiovascular;  Laterality: N/A;    Current Medications: Current Meds  Medication Sig  . amLODipine (NORVASC) 10 MG tablet Take 1 tablet (10 mg total) by mouth daily.  Marland Kitchen aspirin EC 81 MG tablet Take 1 tablet (81 mg total) by mouth daily.  Marland Kitchen atorvastatin (LIPITOR) 40 MG tablet Take 40 mg by mouth daily.  . carvedilol (COREG) 12.5 MG tablet Take 1 tablet (12.5 mg total) by mouth 2 (two) times daily.  . cloNIDine (CATAPRES) 0.1 MG tablet Take 1 tablet (0.1 mg total) by mouth 3 (three)  times daily.  . fluticasone (FLONASE) 50 MCG/ACT nasal spray 1 spray by Each Nare route daily.  . Multiple Vitamins-Minerals (MULTIVITAMIN WITH MINERALS) tablet Take 1 tablet by mouth daily.  . naproxen sodium (ALEVE) 220 MG tablet Take 220 mg by mouth.  . nitroGLYCERIN (NITROSTAT) 0.4 MG SL tablet Place 0.4 mg under the tongue every 5 (five) minutes as needed for chest pain.  . pantoprazole (PROTONIX) 40 MG tablet Take 40 mg by mouth daily.  . ranolazine (RANEXA) 500 MG 12 hr tablet Take 1 tablet (500 mg total) by mouth 2 (two) times daily.     Allergies:   Contrast media [iodinated diagnostic agents], Peanuts [peanut oil], and Plavix [clopidogrel]   Social History   Socioeconomic History  . Marital status: Single    Spouse name: Not on file  . Number of children: Not on file  . Years of education: Not on file  . Highest education level: Not on file  Occupational History  . Not on file  Tobacco Use  . Smoking status: Current Every Day Smoker    Types: Cigarettes  . Smokeless tobacco: Never Used  Substance and Sexual Activity  . Alcohol use: Yes    Alcohol/week: 3.0 - 4.0 standard drinks    Types: 2 Cans of beer, 1 - 2 Shots of liquor per week  . Drug use: Yes  Types: Cocaine  . Sexual activity: Not on file  Other Topics Concern  . Not on file  Social History Narrative  . Not on file   Social Determinants of Health   Financial Resource Strain:   . Difficulty of Paying Living Expenses:   Food Insecurity:   . Worried About Programme researcher, broadcasting/film/video in the Last Year:   . Barista in the Last Year:   Transportation Needs:   . Freight forwarder (Medical):   Marland Kitchen Lack of Transportation (Non-Medical):   Physical Activity:   . Days of Exercise per Week:   . Minutes of Exercise per Session:   Stress:   . Feeling of Stress :   Social Connections:   . Frequency of Communication with Friends and Family:   . Frequency of Social Gatherings with Friends and Family:   .  Attends Religious Services:   . Active Member of Clubs or Organizations:   . Attends Banker Meetings:   Marland Kitchen Marital Status:      Family History: The patient's family history includes CAD in his brother; Diabetes in his father; Hypertension in his mother.  ROS:   Review of Systems  Constitution: Negative for decreased appetite, fever and weight gain.  HENT: Negative for congestion, ear discharge, hoarse voice and sore throat.   Eyes: Negative for discharge, redness, vision loss in right eye and visual halos.  Cardiovascular: Negative for chest pain, dyspnea on exertion, leg swelling, orthopnea and palpitations.  Respiratory: Negative for cough, hemoptysis, shortness of breath and snoring.   Endocrine: Negative for heat intolerance and polyphagia.  Hematologic/Lymphatic: Negative for bleeding problem. Does not bruise/bleed easily.  Skin: Negative for flushing, nail changes, rash and suspicious lesions.  Musculoskeletal: Negative for arthritis, joint pain, muscle cramps, myalgias, neck pain and stiffness.  Gastrointestinal: Negative for abdominal pain, bowel incontinence, diarrhea and excessive appetite.  Genitourinary: Negative for decreased libido, genital sores and incomplete emptying.  Neurological: Negative for brief paralysis, focal weakness, headaches and loss of balance.  Psychiatric/Behavioral: Negative for altered mental status, depression and suicidal ideas.  Allergic/Immunologic: Negative for HIV exposure and persistent infections.    EKGs/Labs/Other Studies Reviewed:    The following studies were reviewed today:   EKG: None today  TTE IMPRESSIONS 12/29/19  1. Left ventricular ejection fraction, by estimation, is 55 to 60%. The  left ventricle has normal function. The left ventricle has no regional  wall motion abnormalities. There is moderate concentric left ventricular  hypertrophy. Left ventricular  diastolic parameters are consistent with Grade I  diastolic dysfunction  (impaired relaxation).  2. Right ventricular systolic function is normal. The right ventricular  size is normal.  3. The mitral valve is normal in structure. No evidence of mitral valve  regurgitation. No evidence of mitral stenosis.  4. The aortic valve is normal in structure. Aortic valve regurgitation is  not visualized. No aortic stenosis is present.  5. The inferior vena cava is normal in size with greater than 50%  respiratory variability, suggesting right atrial pressure of 3 mmHg.   Recent Labs: 10/02/2019: BUN 12; Creatinine, Ser 1.06; Hemoglobin 13.8; Platelets 273; Potassium 3.1; Sodium 142  Recent Lipid Panel No results found for: CHOL, TRIG, HDL, CHOLHDL, VLDL, LDLCALC, LDLDIRECT  Physical Exam:    VS:  BP 118/78 (BP Location: Right Arm, Patient Position: Sitting, Cuff Size: Normal)   Pulse 73   Ht 5\' 6"  (1.676 m)   Wt 178 lb (80.7 kg)  SpO2 98%   BMI 28.73 kg/m     Wt Readings from Last 3 Encounters:  12/30/19 178 lb (80.7 kg)  11/16/19 182 lb (82.6 kg)  09/23/12 183 lb 3.2 oz (83.1 kg)     GEN: Well nourished, well developed in no acute distress HEENT: Normal NECK: No JVD; No carotid bruits LYMPHATICS: No lymphadenopathy CARDIAC: S1S2 noted,RRR, no murmurs, rubs, gallops RESPIRATORY:  Clear to auscultation without rales, wheezing or rhonchi  ABDOMEN: Soft, non-tender, non-distended, +bowel sounds, no guarding. EXTREMITIES: No edema, No cyanosis, no clubbing MUSCULOSKELETAL:  No deformity  SKIN: Warm and dry NEUROLOGIC:  Alert and oriented x 3, non-focal PSYCHIATRIC:  Normal affect, good insight  ASSESSMENT:    1. Coronary artery disease involving native coronary artery of native heart without angina pectoris   2. Essential hypertension   3. Dyslipidemia    PLAN:     He appears to be improved from a cardiovascular standpoint.  No chest pain today.  His blood pressure is appropriate at this time.  We will continue patient  on his current medication regimen.  Coronary artery disease-stable continue aspirin Lipitor and Ranexa.  Hypertension-blood pressure acceptable today.  Continue amlodipine 10 mg daily, carvedilol 12.5 mg twice a day, clonidine 0.1 milligrams 3 times a day,  Dyslipidemia-continue Lipitor.  The patient is in agreement with the above plan. The patient left the office in stable condition.  The patient will follow up in 6 months or sooner if needed   Medication Adjustments/Labs and Tests Ordered: Current medicines are reviewed at length with the patient today.  Concerns regarding medicines are outlined above.  No orders of the defined types were placed in this encounter.  No orders of the defined types were placed in this encounter.   Patient Instructions  Medication Instructions:  Your physician recommends that you continue on your current medications as directed. Please refer to the Current Medication list given to you today.  *If you need a refill on your cardiac medications before your next appointment, please call your pharmacy*   Lab Work: None ordered   If you have labs (blood work) drawn today and your tests are completely normal, you will receive your results only by: Marland Kitchen MyChart Message (if you have MyChart) OR . A paper copy in the mail If you have any lab test that is abnormal or we need to change your treatment, we will call you to review the results.   Testing/Procedures: None ordered   Follow-Up: At [facility], you and your health needs are our priority.  As part of our continuing mission to provide you with exceptional heart care, we have created designated Provider Care Teams.  These Care Teams include your primary Cardiologist (physician) and Advanced Practice Providers (APPs -  Physician Assistants and Nurse Practitioners) who all work together to provide you with the care you need, when you need it.  We recommend signing up for the patient portal called  "MyChart".  Sign up information is provided on this After Visit Summary.  MyChart is used to connect with patients for Virtual Visits (Telemedicine).  Patients are able to view lab/test results, encounter notes, upcoming appointments, etc.  Non-urgent messages can be sent to your provider as well.   To learn more about what you can do with MyChart, go to [email or web address].    Your next appointment:   6 month(s)  The format for your next appointment:   In Person  Provider:   Thomasene Ripple, DO  Other Instructions None      Adopting a Healthy Lifestyle.  Know what a healthy weight is for you (roughly BMI <25) and aim to maintain this   Aim for 7+ servings of fruits and vegetables daily   65-80+ fluid ounces of water or unsweet tea for healthy kidneys   Limit to max 1 drink of alcohol per day; avoid smoking/tobacco   Limit animal fats in diet for cholesterol and heart health - choose grass fed whenever available   Avoid highly processed foods, and foods high in saturated/trans fats   Aim for low stress - take time to unwind and care for your mental health   Aim for 150 min of moderate intensity exercise weekly for heart health, and weights twice weekly for bone health   Aim for 7-9 hours of sleep daily   When it comes to diets, agreement about the perfect plan isnt easy to find, even among the experts. Experts at the [employer] developed an idea known as the Healthy Eating Plate. Just imagine a plate divided into logical, healthy portions.   The emphasis is on diet quality:   Load up on vegetables and fruits - one-half of your plate: Aim for color and variety, and remember that potatoes dont count.   Go for whole grains - one-quarter of your plate: Whole wheat, barley, wheat berries, quinoa, oats, brown rice, and foods made with them. If you want pasta, go with whole wheat pasta.   Protein power - one-quarter of your plate: Fish, chicken, beans,  and nuts are all healthy, versatile protein sources. Limit red meat.   The diet, however, does go beyond the plate, offering a few other suggestions.   Use healthy plant oils, such as olive, canola, soy, corn, sunflower and peanut. Check the labels, and avoid partially hydrogenated oil, which have unhealthy trans fats.   If youre thirsty, drink water. Coffee and tea are good in moderation, but skip sugary drinks and limit milk and dairy products to one or two daily servings.   The type of carbohydrate in the diet is more important than the amount. Some sources of carbohydrates, such as vegetables, fruits, whole grains, and beans-are healthier than others.   Finally, stay active  Signed, Berniece Salines, DO  12/30/2019 8:35 AM    [facility]

## 2019-12-30 NOTE — Patient Instructions (Signed)
Medication Instructions:  Your physician recommends that you continue on your current medications as directed. Please refer to the Current Medication list given to you today.  *If you need a refill on your cardiac medications before your next appointment, please call your pharmacy*   Lab Work: None ordered   If you have labs (blood work) drawn today and your tests are completely normal, you will receive your results only by: . MyChart Message (if you have MyChart) OR . A paper copy in the mail If you have any lab test that is abnormal or we need to change your treatment, we will call you to review the results.   Testing/Procedures: None ordered    Follow-Up: At [facility], you and your health needs are our priority.  As part of our continuing mission to provide you with exceptional heart care, we have created designated Provider Care Teams.  These Care Teams include your primary Cardiologist (physician) and Advanced Practice Providers (APPs -  Physician Assistants and Nurse Practitioners) who all work together to provide you with the care you need, when you need it.  We recommend signing up for the patient portal called "MyChart".  Sign up information is provided on this After Visit Summary.  MyChart is used to connect with patients for Virtual Visits (Telemedicine).  Patients are able to view lab/test results, encounter notes, upcoming appointments, etc.  Non-urgent messages can be sent to your provider as well.   To learn more about what you can do with MyChart, go to [email or web address].    Your next appointment:   6 month(s)  The format for your next appointment:   In Person  Provider:   Kardie Tobb, DO   Other Instructions None   

## 2020-03-09 ENCOUNTER — Telehealth: Payer: Self-pay | Admitting: Cardiology

## 2020-03-09 MED ORDER — ATORVASTATIN CALCIUM 40 MG PO TABS: 40.0000 mg | ORAL_TABLET | Freq: Every day | ORAL | 3 refills | Status: DC

## 2020-03-09 MED ORDER — PANTOPRAZOLE SODIUM 40 MG PO TBEC: 40.0000 mg | DELAYED_RELEASE_TABLET | Freq: Every day | ORAL | 3 refills | Status: DC

## 2020-03-09 NOTE — Addendum Note (Signed)
Addended by: Delorse Limber I on: 03/09/2020 11:02 AM   Modules accepted: Orders

## 2020-03-09 NOTE — Telephone Encounter (Signed)
Pt walked in to get refill on Atorvastatin 40 mg and Pantoprazole 40 mg. Send to [facility]

## 2020-03-09 NOTE — Telephone Encounter (Signed)
Refills sent in per request 

## 2020-03-28 DIAGNOSIS — R079 Chest pain, unspecified: Secondary | ICD-10-CM | POA: Diagnosis not present

## 2020-03-28 DIAGNOSIS — E785 Hyperlipidemia, unspecified: Secondary | ICD-10-CM | POA: Diagnosis not present

## 2020-03-28 DIAGNOSIS — I2510 Atherosclerotic heart disease of native coronary artery without angina pectoris: Secondary | ICD-10-CM | POA: Diagnosis not present

## 2020-03-29 ENCOUNTER — Inpatient Hospital Stay (HOSPITAL_COMMUNITY)
Admission: EM | Admit: 2020-03-29 | Discharge: 2020-04-01 | DRG: 286 | Disposition: A | Discharge status: Home / Self Care | Payer: Medicare Other | Source: Other Acute Inpatient Hospital | Attending: Internal Medicine | Admitting: Internal Medicine

## 2020-03-29 DIAGNOSIS — J189 Pneumonia, unspecified organism: Secondary | ICD-10-CM | POA: Clinically undetermined

## 2020-03-29 DIAGNOSIS — G8929 Other chronic pain: Secondary | ICD-10-CM

## 2020-03-29 DIAGNOSIS — E785 Hyperlipidemia, unspecified: Secondary | ICD-10-CM | POA: Diagnosis not present

## 2020-03-29 DIAGNOSIS — Z8711 Personal history of peptic ulcer disease: Secondary | ICD-10-CM

## 2020-03-29 DIAGNOSIS — I25119 Atherosclerotic heart disease of native coronary artery with unspecified angina pectoris: Secondary | ICD-10-CM

## 2020-03-29 DIAGNOSIS — Z7982 Long term (current) use of aspirin: Secondary | ICD-10-CM | POA: Diagnosis not present

## 2020-03-29 DIAGNOSIS — Z833 Family history of diabetes mellitus: Secondary | ICD-10-CM

## 2020-03-29 DIAGNOSIS — R9439 Abnormal result of other cardiovascular function study: Secondary | ICD-10-CM | POA: Diagnosis present

## 2020-03-29 DIAGNOSIS — R1031 Right lower quadrant pain: Secondary | ICD-10-CM | POA: Diagnosis present

## 2020-03-29 DIAGNOSIS — F17210 Nicotine dependence, cigarettes, uncomplicated: Secondary | ICD-10-CM | POA: Diagnosis present

## 2020-03-29 DIAGNOSIS — Z955 Presence of coronary angioplasty implant and graft: Secondary | ICD-10-CM

## 2020-03-29 DIAGNOSIS — E782 Mixed hyperlipidemia: Secondary | ICD-10-CM | POA: Diagnosis present

## 2020-03-29 DIAGNOSIS — I2510 Atherosclerotic heart disease of native coronary artery without angina pectoris: Secondary | ICD-10-CM | POA: Diagnosis present

## 2020-03-29 DIAGNOSIS — F1410 Cocaine abuse, uncomplicated: Secondary | ICD-10-CM | POA: Diagnosis present

## 2020-03-29 DIAGNOSIS — Z888 Allergy status to other drugs, medicaments and biological substances status: Secondary | ICD-10-CM | POA: Diagnosis not present

## 2020-03-29 DIAGNOSIS — K279 Peptic ulcer, site unspecified, unspecified as acute or chronic, without hemorrhage or perforation: Secondary | ICD-10-CM | POA: Diagnosis present

## 2020-03-29 DIAGNOSIS — I10 Essential (primary) hypertension: Secondary | ICD-10-CM | POA: Diagnosis present

## 2020-03-29 DIAGNOSIS — E876 Hypokalemia: Secondary | ICD-10-CM

## 2020-03-29 DIAGNOSIS — Z79899 Other long term (current) drug therapy: Secondary | ICD-10-CM

## 2020-03-29 DIAGNOSIS — R079 Chest pain, unspecified: Secondary | ICD-10-CM

## 2020-03-29 DIAGNOSIS — I739 Peripheral vascular disease, unspecified: Secondary | ICD-10-CM | POA: Diagnosis present

## 2020-03-29 DIAGNOSIS — D72829 Elevated white blood cell count, unspecified: Secondary | ICD-10-CM | POA: Diagnosis not present

## 2020-03-29 DIAGNOSIS — Z8249 Family history of ischemic heart disease and other diseases of the circulatory system: Secondary | ICD-10-CM

## 2020-03-29 DIAGNOSIS — Z91010 Allergy to peanuts: Secondary | ICD-10-CM | POA: Diagnosis not present

## 2020-03-29 DIAGNOSIS — K219 Gastro-esophageal reflux disease without esophagitis: Secondary | ICD-10-CM | POA: Diagnosis present

## 2020-03-29 DIAGNOSIS — R9430 Abnormal result of cardiovascular function study, unspecified: Secondary | ICD-10-CM | POA: Diagnosis not present

## 2020-03-29 DIAGNOSIS — Z91041 Radiographic dye allergy status: Secondary | ICD-10-CM

## 2020-03-29 DIAGNOSIS — I25110 Atherosclerotic heart disease of native coronary artery with unstable angina pectoris: Secondary | ICD-10-CM | POA: Diagnosis not present

## 2020-03-29 HISTORY — DX: Abnormal result of other cardiovascular function study: R94.39

## 2020-03-29 MED ORDER — ONDANSETRON HCL 4 MG/2ML IJ SOLN: 4.0000 mg | Freq: Four times a day (QID) | INTRAMUSCULAR | Status: DC | PRN

## 2020-03-29 MED ORDER — CARVEDILOL 12.5 MG PO TABS
12.5000 mg | ORAL_TABLET | Freq: Two times a day (BID) | ORAL | Status: DC
  Administered 2020-03-30 – 2020-04-01 (×6): 12.5 mg via ORAL
  Filled 2020-03-29 (×6): qty 1

## 2020-03-29 MED ORDER — PREDNISONE 50 MG PO TABS
50.0000 mg | ORAL_TABLET | Freq: Once | ORAL | Status: AC
  Administered 2020-03-29: 50 mg via ORAL
  Filled 2020-03-29: qty 1

## 2020-03-29 MED ORDER — RANOLAZINE ER 500 MG PO TB12
500.0000 mg | ORAL_TABLET | Freq: Two times a day (BID) | ORAL | Status: DC
  Administered 2020-03-29 – 2020-04-01 (×6): 500 mg via ORAL
  Filled 2020-03-29 (×6): qty 1

## 2020-03-29 MED ORDER — ATORVASTATIN CALCIUM 40 MG PO TABS
40.0000 mg | ORAL_TABLET | Freq: Every day | ORAL | Status: DC
  Administered 2020-03-29 – 2020-04-01 (×4): 40 mg via ORAL
  Filled 2020-03-29 (×4): qty 1

## 2020-03-29 MED ORDER — ACETAMINOPHEN 325 MG PO TABS
650.0000 mg | ORAL_TABLET | ORAL | Status: DC | PRN
  Administered 2020-03-31: 650 mg via ORAL
  Filled 2020-03-29: qty 2

## 2020-03-29 MED ORDER — PANTOPRAZOLE SODIUM 40 MG PO TBEC
40.0000 mg | DELAYED_RELEASE_TABLET | Freq: Every day | ORAL | Status: DC
  Administered 2020-03-29 – 2020-03-30 (×2): 40 mg via ORAL
  Filled 2020-03-29 (×2): qty 1

## 2020-03-29 MED ORDER — FAMOTIDINE 20 MG PO TABS
40.0000 mg | ORAL_TABLET | Freq: Once | ORAL | Status: AC
  Administered 2020-03-30: 40 mg via ORAL
  Filled 2020-03-29: qty 2

## 2020-03-29 MED ORDER — CLONIDINE HCL 0.1 MG PO TABS
0.1000 mg | ORAL_TABLET | Freq: Three times a day (TID) | ORAL | Status: DC
  Administered 2020-03-29 – 2020-04-01 (×9): 0.1 mg via ORAL
  Filled 2020-03-29 (×9): qty 1

## 2020-03-29 MED ORDER — NITROGLYCERIN 0.4 MG SL SUBL: 0.4000 mg | SUBLINGUAL_TABLET | SUBLINGUAL | Status: DC | PRN

## 2020-03-29 MED ORDER — HEPARIN (PORCINE) 25000 UT/250ML-% IV SOLN
1100.0000 [IU]/h | INTRAVENOUS | Status: DC
  Administered 2020-03-29: 1100 [IU]/h via INTRAVENOUS

## 2020-03-29 MED ORDER — AMLODIPINE BESYLATE 10 MG PO TABS
10.0000 mg | ORAL_TABLET | Freq: Every day | ORAL | Status: DC
  Administered 2020-03-30 – 2020-04-01 (×3): 10 mg via ORAL
  Filled 2020-03-29 (×3): qty 1

## 2020-03-29 MED ORDER — ASPIRIN EC 81 MG PO TBEC
81.0000 mg | DELAYED_RELEASE_TABLET | Freq: Every day | ORAL | Status: DC
  Administered 2020-03-30 – 2020-04-01 (×3): 81 mg via ORAL
  Filled 2020-03-29 (×3): qty 1

## 2020-03-29 MED ORDER — PREDNISONE 50 MG PO TABS
50.0000 mg | ORAL_TABLET | Freq: Once | ORAL | Status: AC
  Administered 2020-03-30: 50 mg via ORAL
  Filled 2020-03-29: qty 1

## 2020-03-29 MED ORDER — HEPARIN BOLUS VIA INFUSION
4000.0000 [IU] | Freq: Once | INTRAVENOUS | Status: AC
  Administered 2020-03-29: 4000 [IU] via INTRAVENOUS
  Filled 2020-03-29: qty 4000

## 2020-03-29 NOTE — H&P (Signed)
History and Physical    Cody [name] ERX:[id number] DOB: 1961-09-09 DOA: 03/29/2020  PCP: Ailene Ravel, MD  Patient coming from: [facility] transfer  I have personally briefly reviewed patient's old medical records in [facility]  Chief Complaint: CP  HPI: Cody [name] is a 59 y.o. male with medical history significant of CAD, cocaine abuse (ongoing), HTN.  Pt was admitted to [facility] yesterday with c/o CP, tightness that had onset 3 days prior, ongoing.  As bad as 8/10 intensity.  Trops were flat at 0.02 (x5) during stay.  Pt refused NTG paste and NTG gtt for CP, only taking SL NTG PRN while there.  Pt refused UDS during stay at [facility].  Pt had stress test done at [facility] which shoed inferior/lateral reversible defect.  EF 45%.  2d echo in March 2021 showed EF 55-60%  LHC in 2017 at [facility]: prior RCA stent patent, 25% mild RCA stents narrowing, mild LAD dz.  Cardiology consulted, pt started on heparin gtt, and ultimately recommended to transfer pt to [facility] for LHC.  Review of Systems: As per HPI, otherwise all review of systems negative.  Past Medical History:  Diagnosis Date  . Cocaine abuse (HCC) 09/20/2012  . Coronary artery disease   . Dyslipidemia 09/20/2012  . GERD (gastroesophageal reflux disease)    has stomach ulcers a while back  . HTN (hypertension)   . PAD (peripheral artery disease) (HCC) 09/20/2012  . Smoker 09/20/2012    Past Surgical History:  Procedure Laterality Date  . CORONARY STENT PLACEMENT    . LEFT HEART CATHETERIZATION WITH CORONARY ANGIOGRAM N/A 09/24/2012   Procedure: LEFT HEART CATHETERIZATION WITH CORONARY ANGIOGRAM;  Surgeon: Lesleigh Noe, MD;  Location: [facility] CATH LAB;  Service: Cardiovascular;  Laterality: N/A;     reports that he has been smoking cigarettes. He has never used smokeless tobacco. He reports current alcohol use of about 3.0 - 4.0 standard drinks of alcohol per week. He reports current drug use. Drug: Cocaine.  Allergies  Allergen  Reactions  . Contrast Media [Iodinated Diagnostic Agents] Nausea And Vomiting    Vomiting blood  . Peanuts [Peanut Oil] Hives  . Plavix [Clopidogrel] Other (See Comments)    Damaged left eye    Family History  Problem Relation Age of Onset  . Diabetes Father   . CAD Brother   . Hypertension Mother      Prior to Admission medications   Medication Sig Start Date End Date Taking? Authorizing Provider  amLODipine (NORVASC) 10 MG tablet Take 1 tablet (10 mg total) by mouth daily. 11/16/19  Yes Tobb, Kardie, DO  aspirin EC 81 MG tablet Take 1 tablet (81 mg total) by mouth daily. 11/16/19  Yes Tobb, Kardie, DO  atorvastatin (LIPITOR) 40 MG tablet Take 1 tablet (40 mg total) by mouth daily. 03/09/20  Yes Tobb, Kardie, DO  carvedilol (COREG) 12.5 MG tablet Take 1 tablet (12.5 mg total) by mouth 2 (two) times daily. 09/24/12  Yes Short, Thea Silversmith, MD  cloNIDine (CATAPRES) 0.1 MG tablet Take 1 tablet (0.1 mg total) by mouth 3 (three) times daily. 11/16/19  Yes Tobb, Kardie, DO  fluticasone (FLONASE) 50 MCG/ACT nasal spray Place 1 spray into both nostrils daily as needed for allergies or rhinitis.    Yes [provider]  Multiple Vitamins-Minerals (MULTIVITAMIN WITH MINERALS) tablet Take 1 tablet by mouth daily.   Yes [provider]  nitroGLYCERIN (NITROSTAT) 0.4 MG SL tablet Place 0.4 mg under the tongue every 5 (  five) minutes as needed for chest pain.   Yes [provider]  pantoprazole (PROTONIX) 40 MG tablet Take 1 tablet (40 mg total) by mouth daily. 03/09/20  Yes Tobb, Kardie, DO  ranolazine (RANEXA) 500 MG 12 hr tablet Take 1 tablet (500 mg total) by mouth 2 (two) times daily. 11/16/19  Yes Tobb, Godfrey Pick, DO    Physical Exam: Vitals:   03/29/20 1955  BP: 116/81  Pulse: 63  Resp: 16  Temp: 98.9 F (37.2 C)  TempSrc: Oral  SpO2: 95%  Weight: 79.2 kg  Height: 5\' 6"  (1.676 m)    Constitutional: NAD, calm, comfortable Eyes: PERRL, lids and conjunctivae  normal ENMT: Mucous membranes are moist. Posterior pharynx clear of any exudate or lesions.Normal dentition.  Neck: normal, supple, no masses, no thyromegaly Respiratory: clear to auscultation bilaterally, no wheezing, no crackles. Normal respiratory effort. No accessory muscle use.  Cardiovascular: Regular rate and rhythm, no murmurs / rubs / gallops. No extremity edema. 2+ pedal pulses. No carotid bruits.  Abdomen: no tenderness, no masses palpated. No hepatosplenomegaly. Bowel sounds positive.  Musculoskeletal: no clubbing / cyanosis. No joint deformity upper and lower extremities. Good ROM, no contractures. Normal muscle tone.  Skin: no rashes, lesions, ulcers. No induration Neurologic: CN 2-12 grossly intact. Sensation intact, DTR normal. Strength 5/5 in all 4.  Psychiatric: Normal judgment and insight. Alert and oriented x 3. Normal mood.    Labs on Admission: I have personally reviewed following labs and imaging studies  CBC: No results for input(s): WBC, NEUTROABS, HGB, HCT, MCV, PLT in the last 168 hours. Basic Metabolic Panel: No results for input(s): NA, K, CL, CO2, GLUCOSE, BUN, CREATININE, CALCIUM, MG, PHOS in the last 168 hours. GFR: CrCl cannot be calculated (Patient's most recent lab result is older than the maximum 21 days allowed.). Liver Function Tests: No results for input(s): AST, ALT, ALKPHOS, BILITOT, PROT, ALBUMIN in the last 168 hours. No results for input(s): LIPASE, AMYLASE in the last 168 hours. No results for input(s): AMMONIA in the last 168 hours. Coagulation Profile: No results for input(s): INR, PROTIME in the last 168 hours. Cardiac Enzymes: No results for input(s): CKTOTAL, CKMB, CKMBINDEX, TROPONINI in the last 168 hours. BNP (last 3 results) No results for input(s): PROBNP in the last 8760 hours. HbA1C: No results for input(s): HGBA1C in the last 72 hours. CBG: No results for input(s): GLUCAP in the last 168 hours. Lipid Profile: No results for  input(s): CHOL, HDL, LDLCALC, TRIG, CHOLHDL, LDLDIRECT in the last 72 hours. Thyroid Function Tests: No results for input(s): TSH, T4TOTAL, FREET4, T3FREE, THYROIDAB in the last 72 hours. Anemia Panel: No results for input(s): VITAMINB12, FOLATE, FERRITIN, TIBC, IRON, RETICCTPCT in the last 72 hours. Urine analysis:    Component Value Date/Time   COLORURINE YELLOW 09/20/2012 1838   APPEARANCEUR CLEAR 09/20/2012 1838   LABSPEC 1.010 09/20/2012 1838   PHURINE 7.5 09/20/2012 1838   GLUCOSEU NEGATIVE 09/20/2012 1838   HGBUR NEGATIVE 09/20/2012 1838   BILIRUBINUR NEGATIVE 09/20/2012 1838   KETONESUR NEGATIVE 09/20/2012 1838   PROTEINUR NEGATIVE 09/20/2012 1838   UROBILINOGEN 1.0 09/20/2012 1838   NITRITE NEGATIVE 09/20/2012 1838   LEUKOCYTESUR NEGATIVE 09/20/2012 1838    Radiological Exams on Admission: No results found.  EKG: Independently reviewed.  Assessment/Plan Principal Problem:   Positive cardiac stress test Active Problems:   Coronary artery disease   HTN (hypertension)   Cocaine abuse ([place])    1. Positive cardiac stress test - 1. Pt transferred  from [facility] for possible LHC 2. NPO after MN 3. Spoke with cards on call: 1. Predosing prednisone given his IVC dye allergy 2. 50mg  prednisone now and 50mg  in AM 3. pepcid with AM dose of prednisone 4. NTG SL PRN 5. Tele monitor 6. Cont heparin gtt 2. CAD - 1. Cont ASA 2. Cont statin 3. Cont ranexa 3. HTN - 1. Cont amlodipine, clonidine, coreg  DVT prophylaxis: Lovenox Code Status: Full Family Communication: No family in room Disposition Plan: Home after LHC Consults called: message sent to P. [facility] for cards eval in AM Admission status: Admit to inpatient  Severity of Illness: The appropriate patient status for this patient is INPATIENT. Inpatient status is judged to be reasonable and necessary in order to provide the required intensity of service to ensure the patient's safety. The patient's presenting symptoms,  physical exam findings, and initial radiographic and laboratory data in the context of their chronic comorbidities is felt to place them at high risk for further clinical deterioration. Furthermore, it is not anticipated that the patient will be medically stable for discharge from the hospital within 2 midnights of admission. The following factors support the patient status of inpatient.   IP status for LHC.   * I certify that at the point of admission it is my clinical judgment that the patient will require inpatient hospital care spanning beyond 2 midnights from the point of admission due to high intensity of service, high risk for further deterioration and high frequency of surveillance required.*    Cody [name] M. DO [facility]  How to contact the [facility] Attending or Consulting provider 7A - 7P or covering provider during after hours 7P -7A, for this patient?  1. Check the care team in [facility] and look for a) attending/consulting TR[facility] provider listed and b) the [facility] team listed 2. Log into [email or web address]  Amion Physician Scheduling and messaging for groups and whole hospitals  On call and physician scheduling software for group practices, residents, hospitalists and other medical providers for call, clinic, rotation and shift schedules. On[employer] is a hospital-wide system for scheduling doctors and paging doctors on call. EasyPlot is for scientific plotting and data analysis.  [email or web address]  and use [place]'s universal password to access. If you do not have the password, please contact the hospital operator.  3. Locate the [facility] provider you are looking for under [facility] and page to a number that you can be directly reached. 4. If you still have difficulty reaching the provider, please page the [facility] (Director on Call) for the Hospitalists listed on amion for assistance.  03/29/2020, 9:49 PM

## 2020-03-29 NOTE — Progress Notes (Signed)
ANTICOAGULATION CONSULT NOTE - Initial Consult  Pharmacy Consult for heparin  Indication: chest pain/ACS  Allergies  Allergen Reactions  . Contrast Media [Iodinated Diagnostic Agents] Nausea And Vomiting    Vomiting blood  . Peanuts [Peanut Oil] Hives  . Plavix [Clopidogrel] Other (See Comments)    Damaged left eye    Patient Measurements: Height: 5\' 6"  (167.6 cm) Weight: 79.2 kg (174 lb 9.7 oz) IBW/kg (Calculated) : 63.8   Vital Signs: Temp: 98.9 F (37.2 C) (06/24 1955) Temp Source: Oral (06/24 1955) BP: 116/81 (06/24 1955) Pulse Rate: 63 (06/24 1955)  Labs: No results for input(s): HGB, HCT, PLT, APTT, LABPROT, INR, HEPARINUNFRC, HEPRLOWMOCWT, CREATININE, CKTOTAL, CKMB, TROPONINIHS in the last 72 hours.  CrCl cannot be calculated (Patient's most recent lab result is older than the maximum 21 days allowed.).   Medical History: Past Medical History:  Diagnosis Date  . Cocaine abuse (HCC) 09/20/2012  . Coronary artery disease   . Dyslipidemia 09/20/2012  . GERD (gastroesophageal reflux disease)    has stomach ulcers a while back  . HTN (hypertension)   . PAD (peripheral artery disease) (HCC) 09/20/2012  . Smoker 09/20/2012    Medications:  Medications Prior to Admission  Medication Sig Dispense Refill Last Dose  . amLODipine (NORVASC) 10 MG tablet Take 1 tablet (10 mg total) by mouth daily. 90 tablet 1 Past Week at Unknown time  . aspirin EC 81 MG tablet Take 1 tablet (81 mg total) by mouth daily. 90 tablet 1 Past Week at Unknown time  . atorvastatin (LIPITOR) 40 MG tablet Take 1 tablet (40 mg total) by mouth daily. 90 tablet 3 Past Week at Unknown time  . carvedilol (COREG) 12.5 MG tablet Take 1 tablet (12.5 mg total) by mouth 2 (two) times daily. 60 tablet 1 Past Week at 2200  . cloNIDine (CATAPRES) 0.1 MG tablet Take 1 tablet (0.1 mg total) by mouth 3 (three) times daily. 270 tablet 1 Past Week at Unknown time  . fluticasone (FLONASE) 50 MCG/ACT nasal spray  Place 1 spray into both nostrils daily as needed for allergies or rhinitis.    Past Week at Unknown time  . Multiple Vitamins-Minerals (MULTIVITAMIN WITH MINERALS) tablet Take 1 tablet by mouth daily.   Past Week at Unknown time  . nitroGLYCERIN (NITROSTAT) 0.4 MG SL tablet Place 0.4 mg under the tongue every 5 (five) minutes as needed for chest pain.   unknown at unknown  . pantoprazole (PROTONIX) 40 MG tablet Take 1 tablet (40 mg total) by mouth daily. 90 tablet 3 Past Week at Unknown time  . ranolazine (RANEXA) 500 MG 12 hr tablet Take 1 tablet (500 mg total) by mouth 2 (two) times daily. 180 tablet 1 Past Week at Unknown time   Scheduled:  . atorvastatin  40 mg Oral Daily  . carvedilol  12.5 mg Oral BID  . [START ON 03/30/2020] famotidine  40 mg Oral Once  . predniSONE  50 mg Oral Once  . [START ON 03/30/2020] predniSONE  50 mg Oral Once  . ranolazine  500 mg Oral BID    Assessment: 59 yo male to begin heparin for r/o ACS. No anticoagulants noted PTA -labs pending  Goal of Therapy:  Heparin level 0.3-0.7 units/ml Monitor platelets by anticoagulation protocol: Yes   Plan:  -Heparin bolus 4000 units IV followed by 1100 units/hr -Heparin level in 6 hours and daily wth CBC daily  60, PharmD Clinical Pharmacist **Pharmacist phone directory can now be found on  [email or web address] ([email or web address]).  Listed under [place].

## 2020-03-30 ENCOUNTER — Encounter (HOSPITAL_COMMUNITY): Payer: Self-pay | Admitting: Cardiovascular Disease

## 2020-03-30 ENCOUNTER — Encounter (HOSPITAL_COMMUNITY)
Admission: EM | Disposition: A | Discharge status: Home / Self Care | Payer: Self-pay | Source: Other Acute Inpatient Hospital | Attending: Internal Medicine

## 2020-03-30 DIAGNOSIS — G8929 Other chronic pain: Secondary | ICD-10-CM

## 2020-03-30 DIAGNOSIS — R079 Chest pain, unspecified: Secondary | ICD-10-CM

## 2020-03-30 DIAGNOSIS — Z9189 Other specified personal risk factors, not elsewhere classified: Secondary | ICD-10-CM

## 2020-03-30 DIAGNOSIS — I25110 Atherosclerotic heart disease of native coronary artery with unstable angina pectoris: Secondary | ICD-10-CM

## 2020-03-30 HISTORY — PX: LEFT HEART CATH AND CORONARY ANGIOGRAPHY: CATH118249

## 2020-03-30 HISTORY — PX: INTRAVASCULAR PRESSURE WIRE/FFR STUDY: CATH118243

## 2020-03-30 LAB — BASIC METABOLIC PANEL
Anion gap: 12 (ref 5–15)
BUN: 14 mg/dL (ref 6–20)
CO2: 22 mmol/L (ref 22–32)
Calcium: 9 mg/dL (ref 8.9–10.3)
Chloride: 103 mmol/L (ref 98–111)
Creatinine, Ser: 0.95 mg/dL (ref 0.61–1.24)
GFR calc Af Amer: >60 mL/min (ref >60)
GFR calc non Af Amer: >60 mL/min (ref >60)
Glucose, Bld: 149 mg/dL — ABNORMAL HIGH (ref 70–99)
Potassium: 3.9 mmol/L (ref 3.5–5.1)
Sodium: 137 mmol/L (ref 135–145)

## 2020-03-30 LAB — HIV ANTIBODY (ROUTINE TESTING W REFLEX): HIV Screen 4th Generation wRfx: NONREACTIVE

## 2020-03-30 LAB — CBC
HCT: 40.3 % (ref 39.0–52.0)
Hemoglobin: 13.3 g/dL (ref 13.0–17.0)
MCH: 30.4 pg (ref 26.0–34.0)
MCHC: 33 g/dL (ref 30.0–36.0)
MCV: 92.2 fL (ref 80.0–100.0)
Platelets: 242 10*3/uL (ref 150–400)
RBC: 4.37 MIL/uL (ref 4.22–5.81)
RDW: 12.8 % (ref 11.5–15.5)
WBC: 6.6 10*3/uL (ref 4.0–10.5)
nRBC: 0 % (ref 0.0–0.2)

## 2020-03-30 LAB — MRSA PCR SCREENING: MRSA by PCR: NEGATIVE

## 2020-03-30 LAB — LIPID PANEL
Cholesterol: 164 mg/dL (ref 0–200)
HDL: 46 mg/dL (ref >40)
LDL Cholesterol: 112 mg/dL — ABNORMAL HIGH (ref 0–99)
Total CHOL/HDL Ratio: 3.6 RATIO
Triglycerides: 31 mg/dL (ref <150)
VLDL: 6 mg/dL (ref 0–40)

## 2020-03-30 LAB — POCT ACTIVATED CLOTTING TIME: Activated Clotting Time: 345 seconds

## 2020-03-30 LAB — HEPARIN LEVEL (UNFRACTIONATED): Heparin Unfractionated: 1.12 IU/mL — ABNORMAL HIGH (ref 0.30–0.70)

## 2020-03-30 SURGERY — LEFT HEART CATH AND CORONARY ANGIOGRAPHY
Anesthesia: LOCAL

## 2020-03-30 MED ORDER — DIPHENHYDRAMINE HCL 50 MG/ML IJ SOLN
INTRAMUSCULAR | Status: DC | PRN
  Administered 2020-03-30: 50 mg via INTRAVENOUS

## 2020-03-30 MED ORDER — SODIUM CHLORIDE 0.9% FLUSH: 3.0000 mL | INTRAVENOUS | Status: DC | PRN

## 2020-03-30 MED ORDER — MORPHINE SULFATE (PF) 2 MG/ML IV SOLN: 2.0000 mg | INTRAVENOUS | Status: DC | PRN

## 2020-03-30 MED ORDER — HEPARIN (PORCINE) IN NACL 1000-0.9 UT/500ML-% IV SOLN
INTRAVENOUS | Status: DC | PRN
  Administered 2020-03-30: 500 mL

## 2020-03-30 MED ORDER — MIDAZOLAM HCL 2 MG/2ML IJ SOLN
INTRAMUSCULAR | Status: AC
  Filled 2020-03-30: qty 2

## 2020-03-30 MED ORDER — HEPARIN (PORCINE) 25000 UT/250ML-% IV SOLN: 900.0000 [IU]/h | INTRAVENOUS | Status: DC

## 2020-03-30 MED ORDER — DIPHENHYDRAMINE HCL 50 MG/ML IJ SOLN
INTRAMUSCULAR | Status: AC
  Filled 2020-03-30: qty 1

## 2020-03-30 MED ORDER — LORAZEPAM 2 MG/ML IJ SOLN: 1.0000 mg | INTRAMUSCULAR | Status: DC | PRN

## 2020-03-30 MED ORDER — FAMOTIDINE 20 MG PO TABS
40.0000 mg | ORAL_TABLET | Freq: Every day | ORAL | Status: DC
  Administered 2020-03-31 – 2020-04-01 (×2): 40 mg via ORAL
  Filled 2020-03-30 (×2): qty 2

## 2020-03-30 MED ORDER — HEPARIN SODIUM (PORCINE) 1000 UNIT/ML IJ SOLN
INTRAMUSCULAR | Status: DC | PRN
  Administered 2020-03-30: 4000 [IU] via INTRAVENOUS
  Administered 2020-03-30: 6000 [IU] via INTRAVENOUS

## 2020-03-30 MED ORDER — SODIUM CHLORIDE 0.9% FLUSH: 3.0000 mL | Freq: Two times a day (BID) | INTRAVENOUS | Status: DC

## 2020-03-30 MED ORDER — PANTOPRAZOLE SODIUM 40 MG PO TBEC
40.0000 mg | DELAYED_RELEASE_TABLET | Freq: Every day | ORAL | Status: DC
  Administered 2020-03-30 – 2020-04-01 (×3): 40 mg via ORAL
  Filled 2020-03-30 (×3): qty 1

## 2020-03-30 MED ORDER — MIDAZOLAM HCL 2 MG/2ML IJ SOLN
INTRAMUSCULAR | Status: DC | PRN
  Administered 2020-03-30: 2 mg via INTRAVENOUS

## 2020-03-30 MED ORDER — VERAPAMIL HCL 2.5 MG/ML IV SOLN
INTRAVENOUS | Status: AC
  Filled 2020-03-30: qty 2

## 2020-03-30 MED ORDER — HYDRALAZINE HCL 20 MG/ML IJ SOLN: 10.0000 mg | INTRAMUSCULAR | Status: AC | PRN

## 2020-03-30 MED ORDER — SODIUM CHLORIDE 0.9 % IV SOLN
INTRAVENOUS | Status: AC | PRN
  Administered 2020-03-30: 79 mL/h via INTRAVENOUS

## 2020-03-30 MED ORDER — LABETALOL HCL 5 MG/ML IV SOLN: 10.0000 mg | INTRAVENOUS | Status: AC | PRN

## 2020-03-30 MED ORDER — ASPIRIN 81 MG PO CHEW: 81.0000 mg | CHEWABLE_TABLET | ORAL | Status: DC

## 2020-03-30 MED ORDER — HEPARIN SODIUM (PORCINE) 1000 UNIT/ML IJ SOLN
INTRAMUSCULAR | Status: AC
  Filled 2020-03-30: qty 1

## 2020-03-30 MED ORDER — FENTANYL CITRATE (PF) 100 MCG/2ML IJ SOLN
INTRAMUSCULAR | Status: DC | PRN
  Administered 2020-03-30: 25 ug via INTRAVENOUS

## 2020-03-30 MED ORDER — LIDOCAINE HCL (PF) 1 % IJ SOLN
INTRAMUSCULAR | Status: DC | PRN
  Administered 2020-03-30: 2 mL

## 2020-03-30 MED ORDER — VERAPAMIL HCL 2.5 MG/ML IV SOLN
INTRAVENOUS | Status: DC | PRN
  Administered 2020-03-30: 10 mL via INTRA_ARTERIAL

## 2020-03-30 MED ORDER — SODIUM CHLORIDE 0.9 % WEIGHT BASED INFUSION
3.0000 mL/kg/h | INTRAVENOUS | Status: DC
  Administered 2020-03-30: 3.005 mL/kg/h via INTRAVENOUS

## 2020-03-30 MED ORDER — IOHEXOL 350 MG/ML SOLN
INTRAVENOUS | Status: DC | PRN
  Administered 2020-03-30: 85 mL

## 2020-03-30 MED ORDER — SODIUM CHLORIDE 0.9 % IV SOLN: 250.0000 mL | INTRAVENOUS | Status: DC | PRN

## 2020-03-30 MED ORDER — HEPARIN (PORCINE) IN NACL 1000-0.9 UT/500ML-% IV SOLN
INTRAVENOUS | Status: AC
  Filled 2020-03-30: qty 1000

## 2020-03-30 MED ORDER — METHYLPREDNISOLONE SODIUM SUCC 125 MG IJ SOLR
80.0000 mg | Freq: Once | INTRAMUSCULAR | Status: AC
  Administered 2020-03-30: 80 mg via INTRAVENOUS
  Filled 2020-03-30: qty 2

## 2020-03-30 MED ORDER — FENTANYL CITRATE (PF) 100 MCG/2ML IJ SOLN
INTRAMUSCULAR | Status: AC
  Filled 2020-03-30: qty 2

## 2020-03-30 MED ORDER — SODIUM CHLORIDE 0.9 % WEIGHT BASED INFUSION
1.0000 mL/kg/h | INTRAVENOUS | Status: DC
  Administered 2020-03-30: 1 mL/kg/h via INTRAVENOUS

## 2020-03-30 MED ORDER — METHYLPREDNISOLONE SODIUM SUCC 125 MG IJ SOLR
INTRAMUSCULAR | Status: DC | PRN
  Administered 2020-03-30: 125 mg via INTRAVENOUS

## 2020-03-30 MED ORDER — SODIUM CHLORIDE 0.9 % IV SOLN: INTRAVENOUS | Status: AC

## 2020-03-30 MED ORDER — SODIUM CHLORIDE 0.9% FLUSH
3.0000 mL | Freq: Two times a day (BID) | INTRAVENOUS | Status: DC
  Administered 2020-03-30 – 2020-04-01 (×4): 3 mL via INTRAVENOUS

## 2020-03-30 MED ORDER — LIDOCAINE HCL (PF) 1 % IJ SOLN
INTRAMUSCULAR | Status: AC
  Filled 2020-03-30: qty 30

## 2020-03-30 MED ORDER — METHYLPREDNISOLONE SODIUM SUCC 125 MG IJ SOLR
INTRAMUSCULAR | Status: AC
  Filled 2020-03-30: qty 2

## 2020-03-30 MED ORDER — DIPHENHYDRAMINE HCL 50 MG/ML IJ SOLN: 50.0000 mg | Freq: Once | INTRAMUSCULAR | Status: DC

## 2020-03-30 SURGICAL SUPPLY — 12 items
CATH 5FR JL3.5 JR4 ANG PIG MP (CATHETERS) ×1 IMPLANT
CATH VISTA GUIDE 6FR XB3 (CATHETERS) ×1 IMPLANT
DEVICE RAD COMP TR BAND LRG (VASCULAR PRODUCTS) ×1 IMPLANT
GLIDESHEATH SLEND SS 6F .021 (SHEATH) ×1 IMPLANT
GUIDEWIRE INQWIRE 1.5J.035X260 (WIRE) IMPLANT
GUIDEWIRE PRESSURE COMET II (WIRE) ×1 IMPLANT
INQWIRE 1.5J .035X260CM (WIRE) ×2
KIT ESSENTIALS PG (KITS) ×1 IMPLANT
KIT HEART LEFT (KITS) ×2 IMPLANT
PACK CARDIAC CATHETERIZATION (CUSTOM PROCEDURE TRAY) ×2 IMPLANT
TRANSDUCER W/STOPCOCK (MISCELLANEOUS) ×2 IMPLANT
TUBING CIL FLEX 10 FLL-RA (TUBING) ×2 IMPLANT

## 2020-03-30 NOTE — Plan of Care (Signed)
  Problem: Education: Goal: Understanding of cardiac disease, CV risk reduction, and recovery process will improve Outcome: Progressing   Problem: Activity: Goal: Ability to tolerate increased activity will improve Outcome: Progressing   Problem: Cardiac: Goal: Ability to achieve and maintain adequate cardiovascular perfusion will improve Outcome: Progressing   

## 2020-03-30 NOTE — Consult Note (Addendum)
Cardiology Consultation:   Patient ID: Cody [name]; [id number]; 02-Aug-1961   Admit date: 03/29/2020 Date of Consult: 03/30/2020  Primary Care Provider: Ailene Ravel, MD Primary Cardiologist: Thomasene Ripple, DO 12/30/2019 Primary Electrophysiologist:  None   Patient Profile:   Cody [name] is a 59 y.o. male with a hx of CAD, HTN, HLD, PAD, Tob use, GERD, cocaine abuse, who is being seen today for the evaluation of chest pain at the request of Dr Janee Morn.  History of Present Illness:   Cody [name] was seen by Dr Servando [name] 03/26, doing well on Ranexa and Coreg.   He was admitted to [facility] 06/23 with chest pain, up to 8/10. Troponin x 5 negative. MV at [facility] w/ inferolateral defect c/w ischemia, EF 45%. Pt tx to [facility] to consider cath.   Cody [name] is still having 8/10 chest pain.  It is a stabbing.  It is in the left side of his chest.  His chest wall is not tender.  It does not change with deep inspiration.  He has been having it for least 5 days now.  He has a little shortness of breath with it, but no nausea, vomiting, or diaphoresis.  He is concerned that we will put him on Plavix, he had an eye problem because of the Plavix.  He has seen 2 different eye specialist and they concurred that the damage was from Plavix.  He does not give an exact diagnosis.  He also has a question for the cathing physician.  He does not wish to tell me what it is.   Past Medical History:  Diagnosis Date  . Cocaine abuse (HCC) 09/20/2012  . Coronary artery disease   . Dyslipidemia 09/20/2012  . GERD (gastroesophageal reflux disease)    has stomach ulcers a while back  . HTN (hypertension)   . PAD (peripheral artery disease) (HCC) 09/20/2012  . Smoker 09/20/2012    Past Surgical History:  Procedure Laterality Date  . CORONARY STENT PLACEMENT    . LEFT HEART CATHETERIZATION WITH CORONARY ANGIOGRAM N/A 09/24/2012   Procedure: LEFT HEART CATHETERIZATION WITH CORONARY ANGIOGRAM;  Surgeon:  Lesleigh Noe, MD;  Location: [facility] CATH LAB;  Service: Cardiovascular;  Laterality: N/A;     Prior to Admission medications   Medication Sig Start Date End Date Taking? Authorizing Provider  amLODipine (NORVASC) 10 MG tablet Take 1 tablet (10 mg total) by mouth daily. 11/16/19  Yes Tobb, Kardie, DO  aspirin EC 81 MG tablet Take 1 tablet (81 mg total) by mouth daily. 11/16/19  Yes Tobb, Kardie, DO  atorvastatin (LIPITOR) 40 MG tablet Take 1 tablet (40 mg total) by mouth daily. 03/09/20  Yes Tobb, Kardie, DO  carvedilol (COREG) 12.5 MG tablet Take 1 tablet (12.5 mg total) by mouth 2 (two) times daily. 09/24/12  Yes Short, Thea Silversmith, MD  cloNIDine (CATAPRES) 0.1 MG tablet Take 1 tablet (0.1 mg total) by mouth 3 (three) times daily. 11/16/19  Yes Tobb, Kardie, DO  fluticasone (FLONASE) 50 MCG/ACT nasal spray Place 1 spray into both nostrils daily as needed for allergies or rhinitis.    Yes [provider]  Multiple Vitamins-Minerals (MULTIVITAMIN WITH MINERALS) tablet Take 1 tablet by mouth daily.   Yes [provider]  nitroGLYCERIN (NITROSTAT) 0.4 MG SL tablet Place 0.4 mg under the tongue every 5 (five) minutes as needed for chest pain.   Yes [provider]  pantoprazole (PROTONIX) 40 MG tablet Take 1 tablet (40 mg total) by mouth daily.  03/09/20  Yes Tobb, Kardie, DO  ranolazine (RANEXA) 500 MG 12 hr tablet Take 1 tablet (500 mg total) by mouth 2 (two) times daily. 11/16/19  Yes Tobb, Kardie, DO    Inpatient Medications: Scheduled Meds: . amLODipine  10 mg Oral Daily  . aspirin EC  81 mg Oral Daily  . atorvastatin  40 mg Oral q1800  . carvedilol  12.5 mg Oral BID AC  . cloNIDine  0.1 mg Oral TID  . famotidine  40 mg Oral Once  . [START ON 03/31/2020] famotidine  40 mg Oral Daily  . pantoprazole  40 mg Oral Daily  . predniSONE  50 mg Oral Once  . ranolazine  500 mg Oral BID   Continuous Infusions: . heparin 1,100 Units/hr (03/30/20 0800)   PRN  Meds: acetaminophen, nitroGLYCERIN, ondansetron (ZOFRAN) IV  Allergies:    Allergies  Allergen Reactions  . Contrast Media [Iodinated Diagnostic Agents] Nausea And Vomiting    Vomiting blood  . Peanuts [Peanut Oil] Hives  . Plavix [Clopidogrel] Other (See Comments)    Damaged left eye    Social History:   Social History   Socioeconomic History  . Marital status: Single    Spouse name: Not on file  . Number of children: Not on file  . Years of education: Not on file  . Highest education level: Not on file  Occupational History  . Not on file  Tobacco Use  . Smoking status: Current Every Day Smoker    Types: Cigarettes  . Smokeless tobacco: Never Used  Substance and Sexual Activity  . Alcohol use: Yes    Alcohol/week: 3.0 - 4.0 standard drinks    Types: 2 Cans of beer, 1 - 2 Shots of liquor per week  . Drug use: Yes    Types: Cocaine  . Sexual activity: Not on file  Other Topics Concern  . Not on file  Social History Narrative  . Not on file   Social Determinants of Health   Financial Resource Strain:   . Difficulty of Paying Living Expenses:   Food Insecurity:   . Worried About Programme researcher, broadcasting/film/video in the Last Year:   . Barista in the Last Year:   Transportation Needs:   . Freight forwarder (Medical):   Marland Kitchen Lack of Transportation (Non-Medical):   Physical Activity:   . Days of Exercise per Week:   . Minutes of Exercise per Session:   Stress:   . Feeling of Stress :   Social Connections:   . Frequency of Communication with Friends and Family:   . Frequency of Social Gatherings with Friends and Family:   . Attends Religious Services:   . Active Member of Clubs or Organizations:   . Attends Banker Meetings:   Marland Kitchen Marital Status:   Intimate Partner Violence:   . Fear of Current or Ex-Partner:   . Emotionally Abused:   Marland Kitchen Physically Abused:   . Sexually Abused:     Family History:   Family History  Problem Relation Age of Onset  .  Diabetes Father   . CAD Brother   . Hypertension Mother    Family Status:  Family Status  Relation Name Status  . Father  (Not Specified)  . Brother  (Not Specified)  . Mother  (Not Specified)    ROS:  Please see the history of present illness.  All other ROS reviewed and negative.     Physical Exam/Data:  Vitals:   03/29/20 1955 03/29/20 2353 03/30/20 0352  BP: 116/81 118/74 110/78  Pulse: 63 66 66  Resp: 16 20 19   Temp: 98.9 F (37.2 C) 98.2 F (36.8 C) 98.1 F (36.7 C)  TempSrc: Oral Oral Oral  SpO2: 95% 99% 99%  Weight: 79.2 kg    Height: 5\' 6"  (1.676 m)      Intake/Output Summary (Last 24 hours) at 03/30/2020 0907 Last data filed at 03/30/2020 0800 Gross per 24 hour  Intake 129.06 ml  Output 300 ml  Net -170.94 ml    Last 3 Weights 03/29/2020 12/30/2019 11/16/2019  Weight (lbs) 174 lb 9.7 oz 178 lb 182 lb  Weight (kg) 79.2 kg 80.74 kg 82.555 kg     Body mass index is 28.18 kg/m.   General:  Well nourished, well developed, male in no acute distress HEENT: normal Lymph: no adenopathy Neck: JVD -not elevated Endocrine:  No thryomegaly Vascular: No carotid bruits; 4/4 extremity pulses 2+  Cardiac:  normal S1, S2; RRR; no murmur Lungs:  clear bilaterally, no wheezing, rhonchi or rales  Abd: soft, nontender, no hepatomegaly  Ext: no edema Musculoskeletal:  No deformities, BUE and BLE strength normal and equal Skin: warm and dry  Neuro:  CNs 2-12 intact, no focal abnormalities noted Psych:  Normal affect   EKG:  The EKG was personally reviewed and demonstrates: Sinus rhythm, no acute ischemic changes Telemetry:  Telemetry was personally reviewed and demonstrates: Sinus rhythm   CV studies:   ECHO: 12/29/2019  1. Left ventricular ejection fraction, by estimation, is 55 to 60%. The  left ventricle has normal function. The left ventricle has no regional  wall motion abnormalities. There is moderate concentric left ventricular  hypertrophy. Left  ventricular  diastolic parameters are consistent with Grade I diastolic dysfunction  (impaired relaxation).   2. Right ventricular systolic function is normal. The right ventricular  size is normal.   3. The mitral valve is normal in structure. No evidence of mitral valve  regurgitation. No evidence of mitral stenosis.   4. The aortic valve is normal in structure. Aortic valve regurgitation is  not visualized. No aortic stenosis is present.   5. The inferior vena cava is normal in size with greater than 50%  respiratory variability, suggesting right atrial pressure of 3 mmHg.   CATH: 04/22/2016 Conclusions Diagnostic Procedure Summary Prev RCA stents patent. 25% in mid RCA stent narrowing. Mild disease in LAD and OM1. Normal LV function --EF 55-60% Diagnostic Procedure Recommendations Medical Therapy because no interventional therapy is required.  CATH: 09/24/2012 ANGIOGRAPHIC DATA:   The left main coronary artery is widely patent.   The left anterior descending artery is dominant and wraps around the left ventricular apex. There is a stent in the mid to LAD distal to the septal perforator proximal to the stent and distal to the cell to perforator is an eccentric region of 50% narrowing the ostial LAD contains 30% narrowing. .   The left circumflex artery is large, given origin to several obtuse marginal branches. The proximal to mid vessel contains 30-40% narrowing.   The right coronary artery is dominant. The vessel contains overlapping mid vessel stents and a further distal stent is also noted. The stents are patent. In the distal vessel beyond the last stent there is concentric 30-40% narrowing.   LEFT VENTRICULOGRAM:  Left ventricular angiogram was done in the 30 RAO projection and revealed normal left ventricular wall motion and systolic function with an estimated ejection  fraction of 50%.     IMPRESSIONS:  1. Chest pain of uncertain cause.   2. Widely patent mid LAD stent  and widely patent overlapping mid RCA stents. A distal RCA stent is also patent.   3. Proximal to the LAD stent there is an eccentric 50-60% narrowing. The FFR across this lesion is 0.86.   4. 30-40% stenosis distal to the distal RCA stent.   5. Low normal LV function  Laboratory Data:   Chemistry Recent Labs  Lab 03/30/20 0648  NA 137  K 3.9  CL 103  CO2 22  GLUCOSE 149*  BUN 14  CREATININE 0.95  CALCIUM 9.0  GFRNONAA >60  GFRAA >60  ANIONGAP 12    Lab Results  Component Value Date   ALT 23 09/20/2012   AST 21 09/20/2012   ALKPHOS 98 09/20/2012   BILITOT 0.3 09/20/2012   Hematology Recent Labs  Lab 03/30/20 0648  WBC 6.6  RBC 4.37  HGB 13.3  HCT 40.3  MCV 92.2  MCH 30.4  MCHC 33.0  RDW 12.8  PLT 242   Cardiac Enzymes High Sensitivity Troponin:  No results for input(s): TROPONINIHS in the last 720 hours.    BNPNo results for input(s): BNP, PROBNP in the last 168 hours.  DDimer No results for input(s): DDIMER in the last 168 hours. TSH:  Lab Results  Component Value Date   TSH 1.484 09/20/2012   Lipids: Lab Results  Component Value Date   CHOL 164 03/30/2020   HDL 46 03/30/2020   LDLCALC 112 (H) 03/30/2020   TRIG 31 03/30/2020   CHOLHDL 3.6 03/30/2020   HgbA1c:No results found for: HGBA1C Magnesium:  Magnesium  Date Value Ref Range Status  09/21/2012 2.1 1.5 - 2.5 mg/dL Final     Radiology/Studies:  No results found.  Assessment and Plan:   1.  Chest pain: -Last cath was in 2017 with no intervention required -Cardiac enzymes were negative at [facility], but his Myoview was abnormal -Cardiac catheterization is indicated, patient is anxious to get the procedure completed. -Reported reaction to IV dye, he has had 2 doses of prednisone, can give the third IV if needed. -Allergy to Plavix with an eye problem from the Plavix.  Per Dr. Royann Shivers, Brilinta works differently and should not cause the same problem.  2.   Hypertension: -Blood pressure is well controlled on current therapy -He is on amlodipine 10 mg daily, Coreg 12.5 mg daily and clonidine 0.1 mg 3 times daily -Med changes per MD  3.  Hyperlipidemia: -Prior to admission he was on Lipitor 40 mg daily -LDL is still 112, increase to 80 mg daily or change to Crestor 40 mg daily  4. Reported hx drug use -Patient refused UDS at [facility] -Agree with as needed Ativan  Otherwise, per IM Principal Problem:   Positive cardiac stress test Active Problems:   Coronary artery disease   HTN (hypertension)   Cocaine abuse (HCC)     For questions or updates, please contact [facility] Please consult [email or web address] for contact info under Cardiology/STEMI.   Signed, Theodore Demark, PA-C  03/30/2020 9:07 AM  I have seen and examined the patient along with Theodore Demark, PA-C.  I have reviewed the chart, notes and new data.  I agree with PA/NP's note.  Key new complaints: symptoms are atypical (stabbing pain at rest), but apparently were the same before his stents in [place], about 10 years ago.  His contrast "allergy" was nausea and vomiting, not  true anaphylaxis. His plavix "allergy" was some type of intraocular bleeding "treated with laser", after he had been on plavix for several years. Key examination changes: normal CV exam. He is tender to touch in the R groin ("a lump there" per his report, but I cannot feels any abnormality - no hernia or lymphadenopathy) Key new findings / data: unable to review nuclear scintigraphy images from [facility] ("inferolateral ischemia, EF 49%"). Stents in RCA and LAD were patent at cath here in 2013 and per report of cath in [facility] in 2017. Minimal elevation in hsTroponin, plateau pattern, not consistent with acute coronary sd. ECG abnormalities could be due to LVH or ischemia. Echo in March w moderate LVH and normal LVEF 55-60%, normal wall motion.   PLAN: Proceed with cardiac catheterization today. This  procedure has been fully reviewed with the patient and written informed consent has been obtained. Explained that dual antiplatelet therapy for a minimum of 3-6 months would be mandatory if he needs a new stent, regardless of his previous ophthalmological issues.He would prefer it not be Plavix. Radial access preferred due to his groin discomfort.  Sanda Klein, MD, [place] [phone] 03/30/2020, 9:54 AM

## 2020-03-30 NOTE — Progress Notes (Signed)
PROGRESS NOTE    Cody [name]  JQB:[id number] DOB: 1961/05/21 DOA: 03/29/2020 PCP: Ailene Ravel, MD    No chief complaint on file.   Brief Narrative: HPI per Dr. Inda Coke is a 59 y.o. male with medical history significant of CAD, cocaine abuse (ongoing), HTN.  Pt was admitted to [facility] 1 day prior to admission, with c/o CP, tightness that had onset 3 days prior, ongoing.  As bad as 8/10 intensity.  Trops were flat at 0.02 (x5) during stay.  Pt refused NTG paste and NTG gtt for CP, only taking SL NTG PRN while there.  Pt refused UDS during stay at [facility].  Pt had stress test done at [facility] which shoed inferior/lateral reversible defect.  EF 45%.  2d echo in March 2021 showed EF 55-60%  LHC in 2017 at [facility]: prior RCA stent patent, 25% mild RCA stents narrowing, mild LAD dz.  Cardiology consulted, pt started on heparin gtt, and ultimately recommended to transfer pt to [facility] for LHC.  Assessment & Plan:   Principal Problem:   Positive cardiac stress test Active Problems:   Coronary artery disease   HTN (hypertension)   Cocaine abuse (HCC)  1 chest pain/positive cardiac stress test Patient transferred from [facility] due to abnormal cardiac stress test for possible left heart catheterization.  Patient currently n.p.o.  Patient being premedicated with prednisone, Pepcid prior to cath for IVC dye allergy.  Patient still with ongoing chest pain.  Continue nitroglycerin as needed.  Place on morphine as needed.  Continue Pepcid daily.  On heparin drip.  Continue aspirin, statin, Ranexa, Coreg, Norvasc, clonidine.  Cardiology consulted and patient for probable cardiac catheterization this morning.  2.  Coronary artery disease See problem #1.  Continue Ranexa, statin, aspirin.  3.  Hypertension Continue clonidine, Coreg, Norvasc.    DVT prophylaxis: Heparin Code Status: Full Family Communication: Updated patient.  No family at bedside. Disposition:    Status is: Inpatient    Dispo: The patient is from: Home via [facility]              Anticipated d/c is to: Home              Anticipated d/c date is: 1 to 2 days pending cardiac catheterization.              Patient currently awaiting cardiac catheterization.       Consultants:   Cardiology: Dr. Royann Shivers 03/30/2020  Procedures:   Cardiac catheterization pending  Antimicrobials:   None   Subjective: Patient laying in bed.  Patient with complaints of chest pain.  Denies any shortness of breath.  Awaiting cardiac catheterization.  Objective: Vitals:   03/29/20 1955 03/29/20 2353 03/30/20 0352  BP: 116/81 118/74 110/78  Pulse: 63 66 66  Resp: 16 20 19   Temp: 98.9 F (37.2 C) 98.2 F (36.8 C) 98.1 F (36.7 C)  TempSrc: Oral Oral Oral  SpO2: 95% 99% 99%  Weight: 79.2 kg    Height: 5\' 6"  (1.676 m)      Intake/Output Summary (Last 24 hours) at 03/30/2020 0918 Last data filed at 03/30/2020 0800 Gross per 24 hour  Intake 129.06 ml  Output 300 ml  Net -170.94 ml   Filed Weights   03/29/20 1955  Weight: 79.2 kg    Examination:  General exam: Appears calm and comfortable  Respiratory system: Clear to auscultation. Respiratory effort normal. Cardiovascular system: S1 & S2 heard, RRR. No JVD, murmurs, rubs, gallops or  clicks. No pedal edema. Gastrointestinal system: Abdomen is nondistended, soft and nontender. No organomegaly or masses felt. Normal bowel sounds heard. Central nervous system: Alert and oriented. No focal neurological deficits. Extremities: Symmetric 5 x 5 power. Skin: No rashes, lesions or ulcers Psychiatry: Judgement and insight appear normal. Mood & affect appropriate.     Data Reviewed: I have personally reviewed following labs and imaging studies  CBC: Recent Labs  Lab 03/30/20 0648  WBC 6.6  HGB 13.3  HCT 40.3  MCV 92.2  PLT 161    Basic Metabolic Panel: Recent Labs  Lab 03/30/20 0648  NA 137  K 3.9  CL 103  CO2  22  GLUCOSE 149*  BUN 14  CREATININE 0.95  CALCIUM 9.0    GFR: Estimated Creatinine Clearance: 84.9 mL/min (by C-G formula based on SCr of 0.95 mg/dL).  Liver Function Tests: No results for input(s): AST, ALT, ALKPHOS, BILITOT, PROT, ALBUMIN in the last 168 hours.  CBG: No results for input(s): GLUCAP in the last 168 hours.   Recent Results (from the past 240 hour(s))  MRSA PCR Screening     Status: None   Collection Time: 03/29/20  9:16 PM   Specimen: Nasopharyngeal  Result Value Ref Range Status   MRSA by PCR NEGATIVE NEGATIVE Final    Comment:        The GeneXpert MRSA Assay (FDA approved for NASAL specimens only), is one component of a comprehensive MRSA colonization surveillance program. It is not intended to diagnose MRSA infection nor to guide or monitor treatment for MRSA infections. Performed at [facility], [address], [place], [place] [address]          Radiology Studies: No results found.      Scheduled Meds: . amLODipine  10 mg Oral Daily  . aspirin EC  81 mg Oral Daily  . atorvastatin  40 mg Oral q1800  . carvedilol  12.5 mg Oral BID AC  . cloNIDine  0.1 mg Oral TID  . famotidine  40 mg Oral Once  . [START ON 03/31/2020] famotidine  40 mg Oral Daily  . pantoprazole  40 mg Oral Daily  . predniSONE  50 mg Oral Once  . ranolazine  500 mg Oral BID   Continuous Infusions: . heparin 1,100 Units/hr (03/30/20 0800)     LOS: 1 day    Time spent: 35 minutes    Irine Seal, MD [facility]   To contact the attending provider between 7A-7P or the covering provider during after hours 7P-7A, please log into the web site [email or web address] and access using universal [place] password for that web site. If you do not have the password, please call the hospital operator.  03/30/2020, 9:18 AM

## 2020-03-30 NOTE — Progress Notes (Signed)
1400 Received order post cath. Pt did not have stent or MI for medical treatment. Will sign off. Luetta Nutting RN BSN 03/30/2020 2:03 PM

## 2020-03-30 NOTE — H&P (View-Only) (Signed)
Cardiology Consultation:   [name] ID: Cody [name]; [id number]; 02-Aug-1961   Admit date: 03/29/2020 Date of Consult: 03/30/2020  Primary Care Provider: Ailene Ravel, MD Primary Cardiologist: Cody Ripple, DO 12/30/2019 Primary Electrophysiologist:  None   [name] Profile:   Cody [name] is a 59 y.o. male with a hx of CAD, HTN, HLD, PAD, Tob use, GERD, cocaine abuse, who is being seen today for Cody evaluation of chest pain at Cody request of Cody [name].  History of Present Illness:   Cody [name] was seen by Cody [name] 03/26, doing well on Ranexa and Coreg.   He was admitted to [facility] 06/23 with chest pain, up to 8/10. Troponin x 5 negative. MV at [facility] w/ inferolateral defect c/w ischemia, EF 45%. Pt tx to [facility] to consider cath.   Cody [name] is still having 8/10 chest pain.  It is a stabbing.  It is in Cody left side of his chest.  His chest wall is not tender.  It does not change with deep inspiration.  He has been having it for least 5 days now.  He has a little shortness of breath with it, but no nausea, vomiting, or diaphoresis.  He is concerned that we will put him on Plavix, he had an eye problem because of Cody Plavix.  He has seen 2 different eye specialist and they concurred that Cody damage was from Plavix.  He does not give an exact diagnosis.  He also has a question for Cody cathing physician.  He does not wish to tell me what it is.   Past Medical History:  Diagnosis Date  . Cocaine abuse (HCC) 09/20/2012  . Coronary artery disease   . Dyslipidemia 09/20/2012  . GERD (gastroesophageal reflux disease)    has stomach ulcers a while back  . HTN (hypertension)   . PAD (peripheral artery disease) (HCC) 09/20/2012  . Smoker 09/20/2012    Past Surgical History:  Procedure Laterality Date  . CORONARY STENT PLACEMENT    . LEFT HEART CATHETERIZATION WITH CORONARY ANGIOGRAM N/A 09/24/2012   Procedure: LEFT HEART CATHETERIZATION WITH CORONARY ANGIOGRAM;  Surgeon:  Cody Noe, MD;  Location: [facility] CATH LAB;  Service: Cardiovascular;  Laterality: N/A;     Prior to Admission medications   Medication Sig Start Date End Date Taking? Authorizing Provider  amLODipine (NORVASC) 10 MG tablet Take 1 tablet (10 mg total) by mouth daily. 11/16/19  Yes Tobb, Kardie, DO  aspirin EC 81 MG tablet Take 1 tablet (81 mg total) by mouth daily. 11/16/19  Yes Tobb, Kardie, DO  atorvastatin (LIPITOR) 40 MG tablet Take 1 tablet (40 mg total) by mouth daily. 03/09/20  Yes Tobb, Kardie, DO  carvedilol (COREG) 12.5 MG tablet Take 1 tablet (12.5 mg total) by mouth 2 (two) times daily. 09/24/12  Yes Short, Thea Silversmith, MD  cloNIDine (CATAPRES) 0.1 MG tablet Take 1 tablet (0.1 mg total) by mouth 3 (three) times daily. 11/16/19  Yes Tobb, Kardie, DO  fluticasone (FLONASE) 50 MCG/ACT nasal spray Place 1 spray into both nostrils daily as needed for allergies or rhinitis.    Yes [provider]  Multiple Vitamins-Minerals (MULTIVITAMIN WITH MINERALS) tablet Take 1 tablet by mouth daily.   Yes [provider]  nitroGLYCERIN (NITROSTAT) 0.4 MG SL tablet Place 0.4 mg under Cody tongue every 5 (five) minutes as needed for chest pain.   Yes [provider]  pantoprazole (PROTONIX) 40 MG tablet Take 1 tablet (40 mg total) by mouth daily.  03/09/20  Yes Tobb, Kardie, DO  ranolazine (RANEXA) 500 MG 12 hr tablet Take 1 tablet (500 mg total) by mouth 2 (two) times daily. 11/16/19  Yes Tobb, Kardie, DO    Inpatient Medications: Scheduled Meds: . amLODipine  10 mg Oral Daily  . aspirin EC  81 mg Oral Daily  . atorvastatin  40 mg Oral q1800  . carvedilol  12.5 mg Oral BID AC  . cloNIDine  0.1 mg Oral TID  . famotidine  40 mg Oral Once  . [START ON 03/31/2020] famotidine  40 mg Oral Daily  . pantoprazole  40 mg Oral Daily  . predniSONE  50 mg Oral Once  . ranolazine  500 mg Oral BID   Continuous Infusions: . heparin 1,100 Units/hr (03/30/20 0800)   PRN  Meds: acetaminophen, nitroGLYCERIN, ondansetron (ZOFRAN) IV  Allergies:    Allergies  Allergen Reactions  . Contrast Media [Iodinated Diagnostic Agents] Nausea And Vomiting    Vomiting blood  . Peanuts [Peanut Oil] Hives  . Plavix [Clopidogrel] Other (See Comments)    Damaged left eye    Social History:   Social History   Socioeconomic History  . Marital status: Single    Spouse name: Not on file  . Number of children: Not on file  . Years of education: Not on file  . Highest education level: Not on file  Occupational History  . Not on file  Tobacco Use  . Smoking status: Current Every Day Smoker    Types: Cigarettes  . Smokeless tobacco: Never Used  Substance and Sexual Activity  . Alcohol use: Yes    Alcohol/week: 3.0 - 4.0 standard drinks    Types: 2 Cans of beer, 1 - 2 Shots of liquor per week  . Drug use: Yes    Types: Cocaine  . Sexual activity: Not on file  Other Topics Concern  . Not on file  Social History Narrative  . Not on file   Social Determinants of Health   Financial Resource Strain:   . Difficulty of Paying Living Expenses:   Food Insecurity:   . Worried About Programme researcher, broadcasting/film/video in [place] Last Year:   . Barista in [place] Last Year:   Transportation Needs:   . Freight forwarder (Medical):   Cody [name] Kitchen Lack of Transportation (Non-Medical):   Physical Activity:   . Days of Exercise per Week:   . Minutes of Exercise per Session:   Stress:   . Feeling of Stress :   Social Connections:   . Frequency of Communication with Friends and Family:   . Frequency of Social Gatherings with Friends and Family:   . Attends Religious Services:   . Active Member of Clubs or Organizations:   . Attends Banker Meetings:   Cody [name] Kitchen Marital Status:   Intimate Partner Violence:   . Fear of Current or Ex-Partner:   . Emotionally Abused:   Cody [name] Kitchen Physically Abused:   . Sexually Abused:     Family History:   Family History  Problem Relation Age of Onset  .  Diabetes Father   . CAD Brother   . Hypertension Mother    Family Status:  Family Status  Relation Name Status  . Father  (Not Specified)  . Brother  (Not Specified)  . Mother  (Not Specified)    ROS:  Please see Cody history of present illness.  All other ROS reviewed and negative.     Physical Exam/Data:  Vitals:   03/29/20 1955 03/29/20 2353 03/30/20 0352  BP: 116/81 118/74 110/78  Pulse: 63 66 66  Resp: 16 20 19  Temp: 98.9 F (37.2 C) 98.2 F (36.8 C) 98.1 F (36.7 C)  TempSrc: Oral Oral Oral  SpO2: 95% 99% 99%  Weight: 79.2 kg    Height: 5' 6" (1.676 m)      Intake/Output Summary (Last 24 hours) at 03/30/2020 0907 Last data filed at 03/30/2020 0800 Gross per 24 hour  Intake 129.06 ml  Output 300 ml  Net -170.94 ml    Last 3 Weights 03/29/2020 12/30/2019 11/16/2019  Weight (lbs) 174 lb 9.7 oz 178 lb 182 lb  Weight (kg) 79.2 kg 80.74 kg 82.555 kg     Body mass index is 28.18 kg/m.   General:  Well nourished, well developed, male in no acute distress HEENT: normal Lymph: no adenopathy Neck: JVD -not elevated Endocrine:  No thryomegaly Vascular: No carotid bruits; 4/4 extremity pulses 2+  Cardiac:  normal S1, S2; RRR; no murmur Lungs:  clear bilaterally, no wheezing, rhonchi or rales  Abd: soft, nontender, no hepatomegaly  Ext: no edema Musculoskeletal:  No deformities, BUE and BLE strength normal and equal Skin: warm and dry  Neuro:  CNs 2-12 intact, no focal abnormalities noted Psych:  Normal affect   EKG:  Cody EKG was personally reviewed and demonstrates: Sinus rhythm, no acute ischemic changes Telemetry:  Telemetry was personally reviewed and demonstrates: Sinus rhythm   CV studies:   ECHO: 12/29/2019  1. Left ventricular ejection fraction, by estimation, is 55 to 60%. Cody  left ventricle has normal function. Cody left ventricle has no regional  wall motion abnormalities. There is moderate concentric left ventricular  hypertrophy. Left  ventricular  diastolic parameters are consistent with Grade I diastolic dysfunction  (impaired relaxation).   2. Right ventricular systolic function is normal. Cody right ventricular  size is normal.   3. Cody mitral valve is normal in structure. No evidence of mitral valve  regurgitation. No evidence of mitral stenosis.   4. Cody aortic valve is normal in structure. Aortic valve regurgitation is  not visualized. No aortic stenosis is present.   5. Cody inferior vena cava is normal in size with greater than 50%  respiratory variability, suggesting right atrial pressure of 3 mmHg.   CATH: 04/22/2016 Conclusions Diagnostic Procedure Summary Prev RCA stents patent. 25% in mid RCA stent narrowing. Mild disease in LAD and OM1. Normal LV function --EF 55-60% Diagnostic Procedure Recommendations Medical Therapy because no interventional therapy is required.  CATH: 09/24/2012 ANGIOGRAPHIC DATA:   Cody left main coronary artery is widely patent.   Cody left anterior descending artery is dominant and wraps around Cody left ventricular apex. There is a stent in Cody mid to LAD distal to Cody septal perforator proximal to Cody stent and distal to Cody cell to perforator is an eccentric region of 50% narrowing Cody ostial LAD contains 30% narrowing. .   Cody left circumflex artery is large, given origin to several obtuse marginal branches. Cody proximal to mid vessel contains 30-40% narrowing.   Cody right coronary artery is dominant. Cody vessel contains overlapping mid vessel stents and a further distal stent is also noted. Cody stents are patent. In Cody distal vessel beyond Cody last stent there is concentric 30-40% narrowing.   LEFT VENTRICULOGRAM:  Left ventricular angiogram was done in Cody 30 RAO projection and revealed normal left ventricular wall motion and systolic function with an estimated ejection   fraction of 50%.     IMPRESSIONS:  1. Chest pain of uncertain cause.   2. Widely patent mid LAD stent  and widely patent overlapping mid RCA stents. A distal RCA stent is also patent.   3. Proximal to Cody LAD stent there is an eccentric 50-60% narrowing. Cody FFR across this lesion is 0.86.   4. 30-40% stenosis distal to Cody distal RCA stent.   5. Low normal LV function  Laboratory Data:   Chemistry Recent Labs  Lab 03/30/20 0648  NA 137  K 3.9  CL 103  CO2 22  GLUCOSE 149*  BUN 14  CREATININE 0.95  CALCIUM 9.0  GFRNONAA >60  GFRAA >60  ANIONGAP 12    Lab Results  Component Value Date   ALT 23 09/20/2012   AST 21 09/20/2012   ALKPHOS 98 09/20/2012   BILITOT 0.3 09/20/2012   Hematology Recent Labs  Lab 03/30/20 0648  WBC 6.6  RBC 4.37  HGB 13.3  HCT 40.3  MCV 92.2  MCH 30.4  MCHC 33.0  RDW 12.8  PLT 242   Cardiac Enzymes High Sensitivity Troponin:  No results for input(s): TROPONINIHS in Cody last 720 hours.    BNPNo results for input(s): BNP, PROBNP in Cody last 168 hours.  DDimer No results for input(s): DDIMER in Cody last 168 hours. TSH:  Lab Results  Component Value Date   TSH 1.484 09/20/2012   Lipids: Lab Results  Component Value Date   CHOL 164 03/30/2020   HDL 46 03/30/2020   LDLCALC 112 (H) 03/30/2020   TRIG 31 03/30/2020   CHOLHDL 3.6 03/30/2020   HgbA1c:No results found for: HGBA1C Magnesium:  Magnesium  Date Value Ref Range Status  09/21/2012 2.1 1.5 - 2.5 mg/dL Final     Radiology/Studies:  No results found.  Assessment and Plan:   1.  Chest pain: -Last cath was in 2017 with no intervention required -Cardiac enzymes were negative at [facility], but his Myoview was abnormal -Cardiac catheterization is indicated, [name] is anxious to get Cody procedure completed. -Reported reaction to IV dye, he has had 2 doses of prednisone, can give Cody third IV if needed. -Allergy to Plavix with an eye problem from Cody Plavix.  Per Cody. Royann Shivers, Brilinta works differently and should not cause Cody same problem.  2.   Hypertension: -Blood pressure is well controlled on current therapy -He is on amlodipine 10 mg daily, Coreg 12.5 mg daily and clonidine 0.1 mg 3 times daily -Med changes per MD  3.  Hyperlipidemia: -Prior to admission he was on Lipitor 40 mg daily -LDL is still 112, increase to 80 mg daily or change to Crestor 40 mg daily  4. Reported hx drug use -[name] refused UDS at [facility] -Agree with as needed Ativan  Otherwise, per IM Principal Problem:   Positive cardiac stress test Active Problems:   Coronary artery disease   HTN (hypertension)   Cocaine abuse (HCC)     For questions or updates, please contact [facility] Please consult [email or web address] for contact info under Cardiology/STEMI.   Signed, Theodore Demark, PA-C  03/30/2020 9:07 AM  I have seen and examined Cody [name] along with Theodore Demark, PA-C.  I have reviewed Cody chart, notes and new data.  I agree with PA/NP's note.  Key new complaints: symptoms are atypical (stabbing pain at rest), but apparently were Cody same before his stents in [place], about 10 years ago.  His contrast "allergy" was nausea and vomiting, not  true anaphylaxis. His plavix "allergy" was some type of intraocular bleeding "treated with laser", after he had been on plavix for several years. Key examination changes: normal CV exam. He is tender to touch in Cody R groin ("a lump there" per his report, but I cannot feels any abnormality - no hernia or lymphadenopathy) Key new findings / data: unable to review nuclear scintigraphy images from [facility] ("inferolateral ischemia, EF 49%"). Stents in RCA and LAD were patent at cath here in 2013 and per report of cath in [facility] in 2017. Minimal elevation in hsTroponin, plateau pattern, not consistent with acute coronary sd. ECG abnormalities could be due to LVH or ischemia. Echo in March w moderate LVH and normal LVEF 55-60%, normal wall motion.   PLAN: Proceed with cardiac catheterization today. This  procedure has been fully reviewed with Cody [name] and written informed consent has been obtained. Explained that dual antiplatelet therapy for a minimum of 3-6 months would be mandatory if he needs a new stent, regardless of his previous ophthalmological issues.He would prefer it not be Plavix. Radial access preferred due to his groin discomfort.  Sanda Klein, MD, [place] [phone] 03/30/2020, 9:54 AM

## 2020-03-30 NOTE — Progress Notes (Signed)
ANTICOAGULATION CONSULT NOTE  Pharmacy Consult for heparin  Indication: chest pain/ACS  Allergies  Allergen Reactions  . Contrast Media [Iodinated Diagnostic Agents] Nausea And Vomiting    Vomiting blood  . Peanuts [Peanut Oil] Hives  . Plavix [Clopidogrel] Other (See Comments)    Damaged left eye    Patient Measurements: Height: 5\' 6"  (167.6 cm) Weight: 79.2 kg (174 lb 9.7 oz) IBW/kg (Calculated) : 63.8   Vital Signs: Temp: 98.1 F (36.7 C) (06/25 0352) Temp Source: Oral (06/25 0352) BP: 110/78 (06/25 0352) Pulse Rate: 66 (06/25 0352)  Labs: Recent Labs    03/30/20 0648  HGB 13.3  HCT 40.3  PLT 242  HEPARINUNFRC 1.12*  CREATININE 0.95    Estimated Creatinine Clearance: 84.9 mL/min (by C-G formula based on SCr of 0.95 mg/dL).   Medical History: Past Medical History:  Diagnosis Date  . Cocaine abuse (HCC) 09/20/2012  . Coronary artery disease   . Dyslipidemia 09/20/2012  . GERD (gastroesophageal reflux disease)    has stomach ulcers a while back  . HTN (hypertension)   . PAD (peripheral artery disease) (HCC) 09/20/2012  . Smoker 09/20/2012    Medications:  Medications Prior to Admission  Medication Sig Dispense Refill Last Dose  . amLODipine (NORVASC) 10 MG tablet Take 1 tablet (10 mg total) by mouth daily. 90 tablet 1 Past Week at Unknown time  . aspirin EC 81 MG tablet Take 1 tablet (81 mg total) by mouth daily. 90 tablet 1 Past Week at Unknown time  . atorvastatin (LIPITOR) 40 MG tablet Take 1 tablet (40 mg total) by mouth daily. 90 tablet 3 Past Week at Unknown time  . carvedilol (COREG) 12.5 MG tablet Take 1 tablet (12.5 mg total) by mouth 2 (two) times daily. 60 tablet 1 Past Week at 2200  . cloNIDine (CATAPRES) 0.1 MG tablet Take 1 tablet (0.1 mg total) by mouth 3 (three) times daily. 270 tablet 1 Past Week at Unknown time  . fluticasone (FLONASE) 50 MCG/ACT nasal spray Place 1 spray into both nostrils daily as needed for allergies or rhinitis.     Past Week at Unknown time  . Multiple Vitamins-Minerals (MULTIVITAMIN WITH MINERALS) tablet Take 1 tablet by mouth daily.   Past Week at Unknown time  . nitroGLYCERIN (NITROSTAT) 0.4 MG SL tablet Place 0.4 mg under the tongue every 5 (five) minutes as needed for chest pain.   unknown at unknown  . pantoprazole (PROTONIX) 40 MG tablet Take 1 tablet (40 mg total) by mouth daily. 90 tablet 3 Past Week at Unknown time  . ranolazine (RANEXA) 500 MG 12 hr tablet Take 1 tablet (500 mg total) by mouth 2 (two) times daily. 180 tablet 1 Past Week at Unknown time   Scheduled:  . amLODipine  10 mg Oral Daily  . aspirin EC  81 mg Oral Daily  . atorvastatin  40 mg Oral q1800  . carvedilol  12.5 mg Oral BID AC  . cloNIDine  0.1 mg Oral TID  . famotidine  40 mg Oral Once  . [START ON 03/31/2020] famotidine  40 mg Oral Daily  . pantoprazole  40 mg Oral Daily  . predniSONE  50 mg Oral Once  . ranolazine  500 mg Oral BID    Assessment: 59 yo male to begin heparin for r/o ACS. No anticoagulants noted PTA. Initial heparin level elevated, unable to assess if drawn appropriately. CBC stable, no S/Sx bleeding.  Goal of Therapy:  Heparin level 0.3-0.7 units/ml Monitor platelets  by anticoagulation protocol: Yes   Plan:  -Hold heparin x1h -Resume with no bolus at 900 units/h -Recheck heparin level in 6h   Arrie Senate, PharmD, BCPS Clinical Pharmacist [phone] Please check AMION for all [place] numbers 03/30/2020

## 2020-03-30 NOTE — Progress Notes (Signed)
Bleeding noted on the TR Band after 3rd deflation done. Inflated band with air total of 63ml. Monitored for 1 hour. Started deflating air at 3:30 pm and completed at 5 pm with no further bleeding noted. TR band removed at 6 pm. 2x2 and tegaderm applied. Instructed pt.  to call for any sign of bleeding. Continue to monitor.

## 2020-03-30 NOTE — Progress Notes (Signed)
Back from the cath lab bed sleepy, no signs of distress noted. TR Band to right wrist in placed. Arm elevated with pillow pulses palpable.

## 2020-03-30 NOTE — Progress Notes (Signed)
Transported to the cath lab by bed, stable. 

## 2020-03-30 NOTE — Interval H&P Note (Signed)
History and Physical Interval Note:  03/30/2020 10:42 AM  Cody [name]  has presented today for surgery, with the diagnosis of unstable angina.  The various methods of treatment have been discussed with the patient and family. After consideration of risks, benefits and other options for treatment, the patient has consented to  Procedure(s): LEFT HEART CATH AND CORONARY ANGIOGRAPHY (N/A) as a surgical intervention.  The patient's history has been reviewed, patient examined, no change in status, stable for surgery.  I have reviewed the patient's chart and labs.  Questions were answered to the patient's satisfaction.    Cath Lab Visit (complete for each Cath Lab visit)  Clinical Evaluation Leading to the Procedure:   ACS: No.  Non-ACS:    Anginal Classification: CCS III  Anti-ischemic medical therapy: Maximal Therapy (2 or more classes of medications)  Non-Invasive Test Results: No non-invasive testing performed  Prior CABG: No previous CABG        Verne Carrow

## 2020-03-31 ENCOUNTER — Encounter (HOSPITAL_COMMUNITY): Payer: Self-pay | Admitting: Internal Medicine

## 2020-03-31 ENCOUNTER — Inpatient Hospital Stay (HOSPITAL_COMMUNITY): Payer: Medicare Other

## 2020-03-31 DIAGNOSIS — K279 Peptic ulcer, site unspecified, unspecified as acute or chronic, without hemorrhage or perforation: Secondary | ICD-10-CM

## 2020-03-31 DIAGNOSIS — J189 Pneumonia, unspecified organism: Secondary | ICD-10-CM | POA: Clinically undetermined

## 2020-03-31 DIAGNOSIS — R1031 Right lower quadrant pain: Secondary | ICD-10-CM

## 2020-03-31 DIAGNOSIS — G8929 Other chronic pain: Secondary | ICD-10-CM

## 2020-03-31 DIAGNOSIS — D72829 Elevated white blood cell count, unspecified: Secondary | ICD-10-CM

## 2020-03-31 HISTORY — DX: Pneumonia, unspecified organism: J18.9

## 2020-03-31 LAB — BASIC METABOLIC PANEL
Anion gap: 10 (ref 5–15)
BUN: 16 mg/dL (ref 6–20)
CO2: 22 mmol/L (ref 22–32)
Calcium: 9 mg/dL (ref 8.9–10.3)
Chloride: 105 mmol/L (ref 98–111)
Creatinine, Ser: 1.2 mg/dL (ref 0.61–1.24)
GFR calc Af Amer: >60 mL/min (ref >60)
GFR calc non Af Amer: >60 mL/min (ref >60)
Glucose, Bld: 250 mg/dL — ABNORMAL HIGH (ref 70–99)
Potassium: 3.6 mmol/L (ref 3.5–5.1)
Sodium: 137 mmol/L (ref 135–145)

## 2020-03-31 LAB — CBC
HCT: 35 % — ABNORMAL LOW (ref 39.0–52.0)
Hemoglobin: 11.6 g/dL — ABNORMAL LOW (ref 13.0–17.0)
MCH: 29.9 pg (ref 26.0–34.0)
MCHC: 33.1 g/dL (ref 30.0–36.0)
MCV: 90.2 fL (ref 80.0–100.0)
Platelets: 233 10*3/uL (ref 150–400)
RBC: 3.88 MIL/uL — ABNORMAL LOW (ref 4.22–5.81)
RDW: 12.7 % (ref 11.5–15.5)
WBC: 18.2 10*3/uL — ABNORMAL HIGH (ref 4.0–10.5)
nRBC: 0 % (ref 0.0–0.2)

## 2020-03-31 LAB — URINALYSIS, ROUTINE W REFLEX MICROSCOPIC
Bilirubin Urine: NEGATIVE
Glucose, UA: 50 mg/dL — AB
Hgb urine dipstick: NEGATIVE
Ketones, ur: NEGATIVE mg/dL
Leukocytes,Ua: NEGATIVE
Nitrite: NEGATIVE
Protein, ur: NEGATIVE mg/dL
Specific Gravity, Urine: 1.02 (ref 1.005–1.030)
pH: 6 (ref 5.0–8.0)

## 2020-03-31 LAB — STREP PNEUMONIAE URINARY ANTIGEN: Strep Pneumo Urinary Antigen: NEGATIVE

## 2020-03-31 MED ORDER — SODIUM CHLORIDE 0.9 % IV SOLN
2.0000 g | INTRAVENOUS | Status: DC
  Administered 2020-03-31 – 2020-04-01 (×2): 2 g via INTRAVENOUS
  Filled 2020-03-31: qty 0.03
  Filled 2020-03-31: qty 2

## 2020-03-31 MED ORDER — SIMETHICONE 80 MG PO CHEW
160.0000 mg | CHEWABLE_TABLET | Freq: Three times a day (TID) | ORAL | Status: DC
  Administered 2020-03-31 – 2020-04-01 (×5): 160 mg via ORAL
  Filled 2020-03-31 (×5): qty 2

## 2020-03-31 MED ORDER — SODIUM CHLORIDE 0.9 % IV SOLN
500.0000 mg | INTRAVENOUS | Status: DC
  Administered 2020-03-31 – 2020-04-01 (×2): 500 mg via INTRAVENOUS
  Filled 2020-03-31 (×2): qty 500

## 2020-03-31 MED ORDER — POTASSIUM CHLORIDE CRYS ER 20 MEQ PO TBCR
40.0000 meq | EXTENDED_RELEASE_TABLET | Freq: Once | ORAL | Status: AC
  Administered 2020-03-31: 40 meq via ORAL
  Filled 2020-03-31: qty 2

## 2020-03-31 NOTE — Progress Notes (Addendum)
PROGRESS NOTE    Cody [name]  WUJ:[id number] DOB: 1961/03/17 DOA: 03/29/2020 PCP: Ailene Ravel, MD    No chief complaint on file.   Brief Narrative: HPI per Dr. Inda Coke is a 59 y.o. male with medical history significant of CAD, cocaine abuse (ongoing), HTN.  Pt was admitted to [facility] 1 day prior to admission, with c/o CP, tightness that had onset 3 days prior, ongoing.  As bad as 8/10 intensity.  Trops were flat at 0.02 (x5) during stay.  Pt refused NTG paste and NTG gtt for CP, only taking SL NTG PRN while there.  Pt refused UDS during stay at [facility].  Pt had stress test done at [facility] which shoed inferior/lateral reversible defect.  EF 45%.  2d echo in March 2021 showed EF 55-60%  LHC in 2017 at [facility]: prior RCA stent patent, 25% mild RCA stents narrowing, mild LAD dz.  Cardiology consulted, pt started on heparin gtt, and ultimately recommended to transfer pt to [facility] for LHC.  Assessment & Plan:   Principal Problem:   Positive cardiac stress test Active Problems:   Coronary artery disease   HTN (hypertension)   Cocaine abuse (HCC)   Chest pain of uncertain etiology   CAP (community acquired pneumonia)   Groin pain, chronic, right   Leukocytosis   PUD (peptic ulcer disease)  1 chest pain/positive cardiac stress test Patient transferred from [facility] due to abnormal cardiac stress test for possible left heart catheterization.  Patient status post cardiac catheterization 03/30/2020 with patent stents in mid LAD and patent overlapping stent sites in the RCA, moderate disease in the circumflex not hemodynamically significant, medical therapy recommended per cardiology.  Continue Coreg, Norvasc, Ranexa, statin, aspirin, clonidine.  Will need outpatient follow-up with primary cardiologist 2 weeks post discharge.  Appreciate cardiology input and recommendations.  2.  Coronary artery disease See problem #1.  Continue Ranexa, statin, aspirin.  3.   Hypertension Continue clonidine, Coreg, Norvasc.  4.  Community-acquired pneumonia Patient noted to have a significant leukocytosis this morning of 18.2 from 6.6.  Patient denies any dysuria.  Patient denies any cough.  Urinalysis unremarkable.  Chest x-ray obtained this morning with concerns for right lower lobe pneumonia.  Check urine Legionella antigen.  Check urine pneumococcus antigen.  Place empirically on IV Rocephin and IV azithromycin.  Supportive care.  5.  Right groin pain Patient with complaints of intermittent right groin pain with a sensation of a knot ongoing since his last catheterization.  Patient in 2017.  Right groin area with no signs of infection however some tenderness to palpation.  Check plain films of the pelvis.  Could likely be secondary to her hernia.  Will need outpatient follow-up.  6.  Leukocytosis Likely secondary to #4.  UA negative.  Chest x-ray concerning for possible pneumonia.  Patient afebrile.  Repeat labs in the morning.  7.  History of cocaine use Patient denies any recent cocaine use stated he quit a while back.  Patient noted to have refused UDS on presentation to outside hospital.  Follow.  8.  History of peptic ulcer disease Patient does complain of feeling gassy.  Feeling bloated.  Currently on Pepcid.  Start Protonix.  Simethicone 3 times daily.  Follow H&H.    DVT prophylaxis: Heparin Code Status: Full Family Communication: Updated patient.  No family at bedside. Disposition:   Status is: Inpatient    Dispo: The patient is from: Home via [facility]  Anticipated d/c is to: Home              Anticipated d/c date is: Hopefully tomorrow.                Patient status post cardiac catheterization.  Patient now with a new leukocytosis, chest x-ray concerning for possible pneumonia.         Consultants:   Cardiology: Dr. Sallyanne Kuster 03/30/2020  Procedures:   Cardiac catheterization 03/30/2020  Chest x-ray  03/31/2020  Plain films of the pelvis 03/31/2020  Antimicrobials:   IV Rocephin 03/31/2020  IV azithromycin 03/31/2020   Subjective: Patient laying in bed.  Patient with some complaints of left-sided chest pain which he describes as a pressure.  No shortness of breath.  Complaining of feeling gassy and has been belching.  Complaining of some right groin pain that he states has been ongoing for the past 3 to 4 years and occasionally has to push down on it as sometimes might feel a knot.  States unable to sleep last night.  Denies any cough.  Bleeding at right radial access site resolved with clean dressing in place.  Objective: Vitals:   03/31/20 0253 03/31/20 0300 03/31/20 0646 03/31/20 0907  BP: 130/80  115/78 137/86  Pulse: 69 75 69 68  Resp: (!) 21   19  Temp: 98.2 F (36.8 C)   98.1 F (36.7 C)  TempSrc: Oral   Oral  SpO2: 97% 99%  99%  Weight:      Height:        Intake/Output Summary (Last 24 hours) at 03/31/2020 1044 Last data filed at 03/30/2020 2200 Gross per 24 hour  Intake 900 ml  Output 825 ml  Net 75 ml   Filed Weights   03/29/20 1955  Weight: 79.2 kg    Examination:  General exam: Appears calm and comfortable  Respiratory system: CTA B.  No wheezes, no crackles.  Speaking in full sentences.  No use of accessory muscles of respiration.   Cardiovascular system: Regular rate rhythm no murmurs rubs or gallops.  No JVD.  No lower extremity edema.  Gastrointestinal system: Abdomen is soft, nontender, nondistended, positive bowel sounds.  No rebound.  No guarding.  Central nervous system: Alert and oriented. No focal neurological deficits. Extremities: Symmetric 5 x 5 power. Gu: Right groin area with no rash, some tenderness to palpation, no signs of infection noted. Skin: No rashes, lesions or ulcers Psychiatry: Judgement and insight appear normal. Mood & affect appropriate.     Data Reviewed: I have personally reviewed following labs and imaging  studies  CBC: Recent Labs  Lab 03/30/20 0648 03/31/20 0252  WBC 6.6 18.2*  HGB 13.3 11.6*  HCT 40.3 35.0*  MCV 92.2 90.2  PLT 242 409    Basic Metabolic Panel: Recent Labs  Lab 03/30/20 0648 03/31/20 0252  NA 137 137  K 3.9 3.6  CL 103 105  CO2 22 22  GLUCOSE 149* 250*  BUN 14 16  CREATININE 0.95 1.20  CALCIUM 9.0 9.0    GFR: Estimated Creatinine Clearance: 67.2 mL/min (by C-G formula based on SCr of 1.2 mg/dL).  Liver Function Tests: No results for input(s): AST, ALT, ALKPHOS, BILITOT, PROT, ALBUMIN in the last 168 hours.  CBG: No results for input(s): GLUCAP in the last 168 hours.   Recent Results (from the past 240 hour(s))  MRSA PCR Screening     Status: None   Collection Time: 03/29/20  9:16 PM   Specimen:  Nasopharyngeal  Result Value Ref Range Status   MRSA by PCR NEGATIVE NEGATIVE Final    Comment:        The GeneXpert MRSA Assay (FDA approved for NASAL specimens only), is one component of a comprehensive MRSA colonization surveillance program. It is not intended to diagnose MRSA infection nor to guide or monitor treatment for MRSA infections. Performed at [facility], [address], [place], [place] [address]          Radiology Studies: DG Chest 2 View  Result Date: 03/31/2020 CLINICAL DATA:  Leukocytosis. EXAM: CHEST - 2 VIEW COMPARISON:  03/28/2020 and earlier studies. FINDINGS: Subtle opacity noted in the right lower lobe on the frontal view. Lungs otherwise clear. No pleural effusion or pneumothorax. Cardiac silhouette is normal in size. Normal mediastinal and hilar contours. Skeletal structures are unremarkable. IMPRESSION: 1. Subtle opacity in the right lower lobe noted on the frontal view, consistent with a small area of pneumonia in the proper clinical setting. No other evidence of acute cardiopulmonary disease. Electronically Signed   By: Amie Portland M.D.   On: 03/31/2020 09:51   CARDIAC CATHETERIZATION  Result Date:  03/30/2020  Prox RCA-1 lesion is 30% stenosed.  Prox RCA-2 lesion is 10% stenosed.  Prox RCA to Mid RCA lesion is 30% stenosed.  Mid RCA lesion is 10% stenosed.  Dist RCA lesion is 30% stenosed.  RPAV lesion is 30% stenosed.  Mid Cx lesion is 60% stenosed.  Mid LAD-1 lesion is 50% stenosed.  Previously placed Mid LAD-2 stent (unknown type) is widely patent.  1. Patent stent mid LAD without restenosis. Moderate mid LAD stenosis prior to the old stent, unchanged from last cath. 2. Moderate mid Circumflex stenosis. DFR of this lesion was 0.99 suggesting the stenosis is not significant 3. The RCA has overlapping mid stents and a distal stent, all are patent without restenosis. Recommendations: Continue medical management of CAD        Scheduled Meds: . amLODipine  10 mg Oral Daily  . aspirin EC  81 mg Oral Daily  . atorvastatin  40 mg Oral q1800  . carvedilol  12.5 mg Oral BID AC  . cloNIDine  0.1 mg Oral TID  . famotidine  40 mg Oral Daily  . pantoprazole  40 mg Oral Daily  . ranolazine  500 mg Oral BID  . simethicone  160 mg Oral QID  . sodium chloride flush  3 mL Intravenous Q12H   Continuous Infusions: . sodium chloride    . azithromycin    . cefTRIAXone (ROCEPHIN)  IV       LOS: 2 days    Time spent: 35 minutes    Ramiro Harvest, MD [facility]   To contact the attending provider between 7A-7P or the covering provider during after hours 7P-7A, please log into the web site [email or web address] and access using universal [place] password for that web site. If you do not have the password, please call the hospital operator.  03/31/2020, 10:44 AM

## 2020-03-31 NOTE — Progress Notes (Addendum)
Progress Note  Patient Name: Cody [name] Date of Encounter: 03/31/2020  Primary Cardiologist: Godfrey Pick Tobb, DO  Subjective   No chest pain this morning.  Did not sleep well due to noise last night.  Bleeding at right radial access site has resolved with clean dressing in place.  Mild headache.  Inpatient Medications    Scheduled Meds: . amLODipine  10 mg Oral Daily  . aspirin EC  81 mg Oral Daily  . atorvastatin  40 mg Oral q1800  . carvedilol  12.5 mg Oral BID AC  . cloNIDine  0.1 mg Oral TID  . famotidine  40 mg Oral Daily  . pantoprazole  40 mg Oral Daily  . ranolazine  500 mg Oral BID  . sodium chloride flush  3 mL Intravenous Q12H   Continuous Infusions: . sodium chloride     PRN Meds: sodium chloride, acetaminophen, LORazepam, morphine injection, nitroGLYCERIN, ondansetron (ZOFRAN) IV, sodium chloride flush   Vital Signs    Vitals:   03/31/20 0253 03/31/20 0300 03/31/20 0646 03/31/20 0907  BP: 130/80  115/78 137/86  Pulse: 69 75 69 68  Resp: (!) 21   19  Temp: 98.2 F (36.8 C)   98.1 F (36.7 C)  TempSrc: Oral   Oral  SpO2: 97% 99%  99%  Weight:      Height:        Intake/Output Summary (Last 24 hours) at 03/31/2020 0946 Last data filed at 03/30/2020 2200 Gross per 24 hour  Intake 900 ml  Output 825 ml  Net 75 ml   Filed Weights   03/29/20 1955  Weight: 79.2 kg    Telemetry    Sinus rhythm.  Personally reviewed.  ECG    An ECG dated 03/31/2020 was personally reviewed today and demonstrated:  Sinus rhythm with LVH and repolarization abnormalities.  Physical Exam   GEN: No acute distress.   Neck: No JVD. Cardiac: RRR, no murmur, rub, or gallop.  Respiratory: Nonlabored. Clear to auscultation bilaterally. GI: Soft, nontender, bowel sounds present. MS: No edema; No deformity.  Right radial access site with dressing intact.  Labs    Chemistry Recent Labs  Lab 03/30/20 0648 03/31/20 0252  NA 137 137  K 3.9 3.6  CL 103 105  CO2 22  22  GLUCOSE 149* 250*  BUN 14 16  CREATININE 0.95 1.20  CALCIUM 9.0 9.0  GFRNONAA >60 >60  GFRAA >60 >60  ANIONGAP 12 10     Hematology Recent Labs  Lab 03/30/20 0648 03/31/20 0252  WBC 6.6 18.2*  RBC 4.37 3.88*  HGB 13.3 11.6*  HCT 40.3 35.0*  MCV 92.2 90.2  MCH 30.4 29.9  MCHC 33.0 33.1  RDW 12.8 12.7  PLT 242 233    Radiology    CARDIAC CATHETERIZATION  Result Date: 03/30/2020  Prox RCA-1 lesion is 30% stenosed.  Prox RCA-2 lesion is 10% stenosed.  Prox RCA to Mid RCA lesion is 30% stenosed.  Mid RCA lesion is 10% stenosed.  Dist RCA lesion is 30% stenosed.  RPAV lesion is 30% stenosed.  Mid Cx lesion is 60% stenosed.  Mid LAD-1 lesion is 50% stenosed.  Previously placed Mid LAD-2 stent (unknown type) is widely patent.  1. Patent stent mid LAD without restenosis. Moderate mid LAD stenosis prior to the old stent, unchanged from last cath. 2. Moderate mid Circumflex stenosis. DFR of this lesion was 0.99 suggesting the stenosis is not significant 3. The RCA has overlapping mid stents and a  distal stent, all are patent without restenosis. Recommendations: Continue medical management of CAD    Cardiac Studies   Cardiac catheterization 03/30/2020:  Prox RCA-1 lesion is 30% stenosed.  Prox RCA-2 lesion is 10% stenosed.  Prox RCA to Mid RCA lesion is 30% stenosed.  Mid RCA lesion is 10% stenosed.  Dist RCA lesion is 30% stenosed.  RPAV lesion is 30% stenosed.  Mid Cx lesion is 60% stenosed.  Mid LAD-1 lesion is 50% stenosed.  Previously placed Mid LAD-2 stent (unknown type) is widely patent.   1. Patent stent mid LAD without restenosis. Moderate mid LAD stenosis prior to the old stent, unchanged from last cath.  2. Moderate mid Circumflex stenosis. DFR of this lesion was 0.99 suggesting the stenosis is not significant 3. The RCA has overlapping mid stents and a distal stent, all are patent without restenosis.   Recommendations: Continue medical  management of CAD  Patient Profile     59 y.o. male with a history of hypertension, hyperlipidemia, PAD, GERD, tobacco abuse, cocaine abuse, and CAD status post previous stent interventions to the LAD and RCA.  He presents in transfer from [facility] with history of chest pain and abnormal myocardial perfusion study.  Assessment & Plan    1.  CAD with chest pain, no clear evidence of ACS with normal troponin I levels.  ECG without acute ST changes.  Cardiac catheterization from June 25 shows patent stent site in the mid LAD and patent overlapping stent sites in the RCA.  Moderate disease in the circumflex was not hemodynamically significant by DFR of 0.99 and medical therapy recommended.  2.  Essential hypertension, currently on Norvasc, clonidine, and Coreg.  Systolic 115-140.  3.  History of recurring cocaine abuse.  4.  Mixed hyperlipidemia, on Lipitor.  5.  Leukocytosis, patient afebrile.  Work-up underway per primary team.  Recommended medical therapy for treatment of CAD. [facility] will sign off.   Medication Recommendations: Aspirin 81 mg daily, Norvasc 10 mg daily, Lipitor 40 mg daily, Coreg 12.5 mg twice daily, clonidine 0.1 mg TID, and Ranexa 500 mg twice daily Other recommendations (labs, testing, etc): No further cardiac testing at this time Follow up as an outpatient: Follow-up with Dr. Servando Salina in the next 2 weeks.  Signed, Nona Dell, MD  03/31/2020, 9:46 AM

## 2020-04-01 ENCOUNTER — Other Ambulatory Visit: Payer: Self-pay

## 2020-04-01 DIAGNOSIS — F1410 Cocaine abuse, uncomplicated: Secondary | ICD-10-CM

## 2020-04-01 DIAGNOSIS — E876 Hypokalemia: Secondary | ICD-10-CM

## 2020-04-01 LAB — BASIC METABOLIC PANEL
Anion gap: 10 (ref 5–15)
BUN: 14 mg/dL (ref 6–20)
CO2: 24 mmol/L (ref 22–32)
Calcium: 8.5 mg/dL — ABNORMAL LOW (ref 8.9–10.3)
Chloride: 106 mmol/L (ref 98–111)
Creatinine, Ser: 0.97 mg/dL (ref 0.61–1.24)
GFR calc Af Amer: >60 mL/min (ref >60)
GFR calc non Af Amer: >60 mL/min (ref >60)
Glucose, Bld: 149 mg/dL — ABNORMAL HIGH (ref 70–99)
Potassium: 3.2 mmol/L — ABNORMAL LOW (ref 3.5–5.1)
Sodium: 140 mmol/L (ref 135–145)

## 2020-04-01 LAB — CBC
HCT: 35.5 % — ABNORMAL LOW (ref 39.0–52.0)
Hemoglobin: 11.8 g/dL — ABNORMAL LOW (ref 13.0–17.0)
MCH: 30.2 pg (ref 26.0–34.0)
MCHC: 33.2 g/dL (ref 30.0–36.0)
MCV: 90.8 fL (ref 80.0–100.0)
Platelets: 221 10*3/uL (ref 150–400)
RBC: 3.91 MIL/uL — ABNORMAL LOW (ref 4.22–5.81)
RDW: 12.9 % (ref 11.5–15.5)
WBC: 13.6 10*3/uL — ABNORMAL HIGH (ref 4.0–10.5)
nRBC: 0 % (ref 0.0–0.2)

## 2020-04-01 LAB — URINE CULTURE: Culture: <10000 — AB

## 2020-04-01 MED ORDER — CEFPODOXIME PROXETIL 200 MG PO TABS: 200.0000 mg | ORAL_TABLET | Freq: Two times a day (BID) | ORAL | 0 refills | Status: AC

## 2020-04-01 MED ORDER — PANTOPRAZOLE SODIUM 40 MG PO TBEC: 40.0000 mg | DELAYED_RELEASE_TABLET | Freq: Every day | ORAL | 1 refills | Status: DC

## 2020-04-01 MED ORDER — CARVEDILOL 12.5 MG PO TABS: 12.5000 mg | ORAL_TABLET | Freq: Two times a day (BID) | ORAL | 1 refills | Status: DC

## 2020-04-01 MED ORDER — AMLODIPINE BESYLATE 10 MG PO TABS: 10.0000 mg | ORAL_TABLET | Freq: Every day | ORAL | 1 refills | Status: DC

## 2020-04-01 MED ORDER — AZITHROMYCIN 250 MG PO TABS
250.0000 mg | ORAL_TABLET | Freq: Every day | ORAL | 0 refills | Status: AC
Start: 2020-04-02 — End: 2020-04-05

## 2020-04-01 MED ORDER — FLUTICASONE PROPIONATE 50 MCG/ACT NA SUSP: 1.0000 | Freq: Every day | NASAL | 0 refills | Status: DC | PRN

## 2020-04-01 MED ORDER — ATORVASTATIN CALCIUM 40 MG PO TABS: 40.0000 mg | ORAL_TABLET | Freq: Every day | ORAL | 1 refills | Status: DC

## 2020-04-01 MED ORDER — POTASSIUM CHLORIDE CRYS ER 20 MEQ PO TBCR
40.0000 meq | EXTENDED_RELEASE_TABLET | ORAL | Status: AC
  Administered 2020-04-01 (×2): 40 meq via ORAL
  Filled 2020-04-01 (×2): qty 2

## 2020-04-01 MED ORDER — SIMETHICONE 80 MG PO CHEW: 160.0000 mg | CHEWABLE_TABLET | Freq: Three times a day (TID) | ORAL | 0 refills | Status: DC | PRN

## 2020-04-01 MED ORDER — NITROGLYCERIN 0.4 MG SL SUBL: 0.4000 mg | SUBLINGUAL_TABLET | SUBLINGUAL | 0 refills | Status: DC | PRN

## 2020-04-01 MED ORDER — CLONIDINE HCL 0.1 MG PO TABS: 0.1000 mg | ORAL_TABLET | Freq: Three times a day (TID) | ORAL | 1 refills | Status: DC

## 2020-04-01 MED ORDER — RANOLAZINE ER 500 MG PO TB12
500.0000 mg | ORAL_TABLET | Freq: Two times a day (BID) | ORAL | 1 refills | Status: DC
Start: 2020-04-01 — End: 2020-04-03

## 2020-04-01 MED ORDER — ASPIRIN EC 81 MG PO TBEC: 81.0000 mg | DELAYED_RELEASE_TABLET | Freq: Every day | ORAL | 1 refills | Status: DC

## 2020-04-01 NOTE — Progress Notes (Signed)
Removed PIV access x 1 and patient received discharge instructions. He understood it well. Patient took his all belongings. Awaiting his ride. HS [employer]

## 2020-04-01 NOTE — Progress Notes (Signed)
Patient is going to be discharge today. Patient didn't want to see his regular PCP after discharged from hospital, because his PCP is high Co-pay.Dr. Janee Morn put on the order for finding PCP for him. Talked Care manager regarding this matter. She couldn't make an appointment for him due to weekend, but put on the [place] [facility] info in the discharge instructions and patient will call them tomorrow for an appointment. Patient understood it well. His friends will pick him up, but he will come around 6:30 pm. HS [place]

## 2020-04-01 NOTE — Discharge Summary (Signed)
Physician Discharge Summary  Cody [name] OVZ:[id number] DOB: 04-18-1961 DOA: 03/29/2020  PCP: Ailene Ravel, MD  Admit date: 03/29/2020 Discharge date: 04/01/2020  Time spent: 55 minutes  Recommendations for Outpatient Follow-up:  1. Follow-up with Dr. Servando Salina, cardiology in 2 weeks.  On follow-up patient will need a basic metabolic profile done to follow-up on electrolytes and renal function.  Patient's coronary artery disease will need to be followed up upon. 2. Follow-up with Hamrick, Maura L, MD in 1 to 2 weeks.  On follow-up patient will need a basic metabolic profile done to follow-up on electrolytes and renal function.  Patient's Also need to be followed up upon.   Discharge Diagnoses:  Principal Problem:   Positive cardiac stress test Active Problems:   Coronary artery disease   HTN (hypertension)   Cocaine abuse (HCC)   Chest pain of uncertain etiology   CAP (community acquired pneumonia)   Groin pain, chronic, right   Leukocytosis   PUD (peptic ulcer disease)   Hypokalemia   Discharge Condition: Stable and improved  Diet recommendation: Heart healthy  Filed Weights   03/29/20 1955  Weight: 79.2 kg    History of present illness:  HPI per Dr. Inda Coke is a 59 y.o. male with medical history significant of CAD, cocaine abuse (ongoing), HTN.  Pt was admitted to [facility] 1 day prior to admission, with c/o CP, tightness that had onset 3 days prior, ongoing.  As bad as 8/10 intensity.  Trops were flat at 0.02 (x5) during stay.  Pt refused NTG paste and NTG gtt for CP, only taking SL NTG PRN while there.  Pt refused UDS during stay at [facility].  Pt had stress test done at [facility] which shoed inferior/lateral reversible defect.  EF 45%.  2d echo in March 2021 showed EF 55-60%  LHC in 2017 at [facility]: prior RCA stent patent, 25% mild RCA stents narrowing, mild LAD dz.  Cardiology consulted, pt started on heparin gtt, and ultimately recommended to transfer pt to  [facility] for LHC.  Hospital Course:  1 chest pain/positive cardiac stress test Patient transferred from [facility] due to abnormal cardiac stress test for possible left heart catheterization.  Patient seen in consultation by cardiology and followed by cardiology throughout the hospitalization. Patient status post cardiac catheterization 03/30/2020 with patent stents in mid LAD and patent overlapping stent sites in the RCA, moderate disease in the circumflex not hemodynamically significant, medical therapy recommended per cardiology.  Patient maintained on Coreg, Norvasc, Ranexa, statin, aspirin, clonidine.  Will need outpatient follow-up with primary cardiologist 2 weeks post discharge.   2.  Coronary artery disease See problem #1.    Patient was maintained on Coreg, Norvasc, Ranexa, statin, aspirin.  Patient was followed by cardiology during the hospitalization.  3.  Hypertension Patient maintained on home regimen of clonidine, Coreg, Norvasc.  4.  Community-acquired pneumonia Patient noted to have a significant leukocytosis on 03/31/2020, of 18.2 from 6.6.  Patient denied any dysuria.  Patient denied any cough.  Urinalysis unremarkable.  Chest x-ray obtained with concerns for right lower lobe pneumonia.  Urine Legionella antigen pending.  Urine pneumococcus antigen was negative.  Patient was placed empirically on IV Rocephin and IV azithromycin.  Leukocytosis trended down.  Patient remained afebrile.  Patient will be discharged home on 3 days of oral Vantin and azithromycin to complete a 5-day course of antibiotic treatment.  Outpatient follow-up with PCP.  5.  Right groin pain Patient with complaints of intermittent right groin  pain with a sensation of a knot ongoing since his last catheterization.  Patient in 2017.  Right groin area with no signs of infection however some tenderness to palpation.    Plain films of the pelvis unremarkable.  Outpatient follow-up with PCP.   6.   Leukocytosis Likely secondary to #4.  UA negative.  Chest x-ray concerning for possible pneumonia.  Patient afebrile.    Patient started on empiric IV antibiotics.  Leukocytosis improved.  Outpatient follow-up.   7.  History of cocaine use Patient denies any recent cocaine use stated he quit a while back.  Patient noted to have refused UDS on presentation to outside hospital.  Follow.  8.  History of peptic ulcer disease Patient does complain of feeling gassy.  Feeling bloated.    Patient initially placed on Pepcid and subsequently started on Protonix.  Patient noted to have been on Protonix prior to admission.  Patient also placed on scheduled simethicone.  H&H remained stable.  Patient be discharged home on home regimen of Protonix will need to follow-up with PCP in the outpatient setting.  9.  Hypokalemia Patient noted on day of discharge to be hypokalemic with a potassium of 3.2.  Patient was given K. Dur 40 mEq p.o. every 4 hours x2 doses on day of discharge.  Outpatient follow-up.   Procedures:  Cardiac catheterization 03/30/2020  Chest x-ray 03/31/2020  Plain films of the pelvis 03/31/2020  Consultations:  Cardiology: Dr. Royann Shivers 03/30/2020   Discharge Exam: Vitals:   04/01/20 0354 04/01/20 0717  BP: 125/79 (!) 146/88  Pulse: 65 72  Resp: 14 18  Temp: 97.9 F (36.6 C) 98.2 F (36.8 C)  SpO2: 98% 99%    General: NAD Cardiovascular: Regular rate rhythm no murmurs rubs or gallops.  No JVD.  No lower extremity edema. Respiratory: Clear to auscultation bilaterally.  No wheezes, no crackles, no rhonchi.  Discharge Instructions   Discharge Instructions    Diet - low sodium heart healthy   Complete by: As directed    Increase activity slowly   Complete by: As directed      Allergies as of 04/01/2020      Reactions   Contrast Media [iodinated Diagnostic Agents] Nausea And Vomiting   Vomiting blood   Peanuts [peanut Oil] Hives   Plavix [clopidogrel] Other (See  Comments)   Damaged left eye      Medication List    TAKE these medications   amLODipine 10 MG tablet Commonly known as: NORVASC Take 1 tablet (10 mg total) by mouth daily.   aspirin EC 81 MG tablet Take 1 tablet (81 mg total) by mouth daily.   atorvastatin 40 MG tablet Commonly known as: LIPITOR Take 1 tablet (40 mg total) by mouth daily.   azithromycin 250 MG tablet Commonly known as: Zithromax Take 1 tablet (250 mg total) by mouth daily for 3 days. Start taking on: April 02, 2020   carvedilol 12.5 MG tablet Commonly known as: COREG Take 1 tablet (12.5 mg total) by mouth 2 (two) times daily.   cefpodoxime 200 MG tablet Commonly known as: VANTIN Take 1 tablet (200 mg total) by mouth 2 (two) times daily for 3 days. Start taking on: April 02, 2020   cloNIDine 0.1 MG tablet Commonly known as: CATAPRES Take 1 tablet (0.1 mg total) by mouth 3 (three) times daily.   fluticasone 50 MCG/ACT nasal spray Commonly known as: FLONASE Place 1 spray into both nostrils daily as needed for allergies or rhinitis.  multivitamin with minerals tablet Take 1 tablet by mouth daily.   nitroGLYCERIN 0.4 MG SL tablet Commonly known as: NITROSTAT Place 1 tablet (0.4 mg total) under the tongue every 5 (five) minutes as needed for chest pain.   pantoprazole 40 MG tablet Commonly known as: PROTONIX Take 1 tablet (40 mg total) by mouth daily.   ranolazine 500 MG 12 hr tablet Commonly known as: Ranexa Take 1 tablet (500 mg total) by mouth 2 (two) times daily.   simethicone 80 MG chewable tablet Commonly known as: MYLICON Chew 2 tablets (160 mg total) by mouth 3 (three) times daily as needed for flatulence.      Allergies  Allergen Reactions   Contrast Media [Iodinated Diagnostic Agents] Nausea And Vomiting    Vomiting blood   Peanuts [Peanut Oil] Hives   Plavix [Clopidogrel] Other (See Comments)    Damaged left eye    Follow-up Information    Tobb, Kardie, DO. Schedule an  appointment as soon as possible for a visit in 2 week(s).   Specialty: Cardiology Contact information: [address] [place] [address] [phone]        Ailene Ravel, MD. Schedule an appointment as soon as possible for a visit.   Specialty: Family Medicine Why: f/u in 1-2 weeks Contact information: [address] [place] [address] [phone]                The results of significant diagnostics from this hospitalization (including imaging, microbiology, ancillary and laboratory) are listed below for reference.    Significant Diagnostic Studies: DG Chest 2 View  Result Date: 03/31/2020 CLINICAL DATA:  Leukocytosis. EXAM: CHEST - 2 VIEW COMPARISON:  03/28/2020 and earlier studies. FINDINGS: Subtle opacity noted in the right lower lobe on the frontal view. Lungs otherwise clear. No pleural effusion or pneumothorax. Cardiac silhouette is normal in size. Normal mediastinal and hilar contours. Skeletal structures are unremarkable. IMPRESSION: 1. Subtle opacity in the right lower lobe noted on the frontal view, consistent with a small area of pneumonia in the proper clinical setting. No other evidence of acute cardiopulmonary disease. Electronically Signed   By: Amie Portland M.D.   On: 03/31/2020 09:51   DG Pelvis 1-2 Views  Result Date: 03/31/2020 CLINICAL DATA:  Right groin region pain. EXAM: PELVIS - 1-2 VIEW COMPARISON:  None. FINDINGS: No fracture or bone lesion. Bilateral hip joint space narrowing which is most evident superior laterally. There is superior acetabular subchondral sclerosis with marginal osteophytes from the bases of the femoral heads. SI joints and symphysis pubis are normally spaced and aligned. Soft tissues are unremarkable. IMPRESSION: 1. No fracture, bone lesion or acute finding. 2. Moderate bilateral hip joint arthropathic/degenerative changes. Electronically Signed   By: Amie Portland M.D.   On: 03/31/2020 13:35   CARDIAC  CATHETERIZATION  Result Date: 03/30/2020  Prox RCA-1 lesion is 30% stenosed.  Prox RCA-2 lesion is 10% stenosed.  Prox RCA to Mid RCA lesion is 30% stenosed.  Mid RCA lesion is 10% stenosed.  Dist RCA lesion is 30% stenosed.  RPAV lesion is 30% stenosed.  Mid Cx lesion is 60% stenosed.  Mid LAD-1 lesion is 50% stenosed.  Previously placed Mid LAD-2 stent (unknown type) is widely patent.  1. Patent stent mid LAD without restenosis. Moderate mid LAD stenosis prior to the old stent, unchanged from last cath. 2. Moderate mid Circumflex stenosis. DFR of this lesion was 0.99 suggesting the stenosis is not significant 3. The RCA has  overlapping mid stents and a distal stent, all are patent without restenosis. Recommendations: Continue medical management of CAD    Microbiology: Recent Results (from the past 240 hour(s))  MRSA PCR Screening     Status: None   Collection Time: 03/29/20  9:16 PM   Specimen: Nasopharyngeal  Result Value Ref Range Status   MRSA by PCR NEGATIVE NEGATIVE Final    Comment:        The GeneXpert MRSA Assay (FDA approved for NASAL specimens only), is one component of a comprehensive MRSA colonization surveillance program. It is not intended to diagnose MRSA infection nor to guide or monitor treatment for MRSA infections. Performed at [facility], [place] [address], [place], [place] [address]   Culture, Urine     Status: None (Preliminary result)   Collection Time: 03/31/20  8:30 AM   Specimen: Urine, Random  Result Value Ref Range Status   Specimen Description URINE, RANDOM  Final   Special Requests NONE  Final   Culture   Final    CULTURE REINCUBATED FOR BETTER GROWTH Performed at [facility], [place] [address], [place], [place] [address]    Report Status PENDING  Incomplete     Labs: Basic Metabolic Panel: Recent Labs  Lab 03/30/20 0648 03/31/20 0252 04/01/20 0113  NA 137 137 140  K 3.9 3.6 3.2*  CL 103 105 106  CO2 22 22 24   GLUCOSE  149* 250* 149*  BUN 14 16 14   CREATININE 0.95 1.20 0.97  CALCIUM 9.0 9.0 8.5*   Liver Function Tests: No results for input(s): AST, ALT, ALKPHOS, BILITOT, PROT, ALBUMIN in the last 168 hours. No results for input(s): LIPASE, AMYLASE in the last 168 hours. No results for input(s): AMMONIA in the last 168 hours. CBC: Recent Labs  Lab 03/30/20 0648 03/31/20 0252 04/01/20 0113  WBC 6.6 18.2* 13.6*  HGB 13.3 11.6* 11.8*  HCT 40.3 35.0* 35.5*  MCV 92.2 90.2 90.8  PLT 242 233 221   Cardiac Enzymes: No results for input(s): CKTOTAL, CKMB, CKMBINDEX, TROPONINI in the last 168 hours. BNP: BNP (last 3 results) No results for input(s): BNP in the last 8760 hours.  ProBNP (last 3 results) No results for input(s): PROBNP in the last 8760 hours.  CBG: No results for input(s): GLUCAP in the last 168 hours.     Signed:  Irine Seal MD.  [facility] 04/01/2020, 9:57 AM

## 2020-04-02 LAB — LEGIONELLA PNEUMOPHILA SEROGP 1 UR AG: L. pneumophila Serogp 1 Ur Ag: NEGATIVE

## 2020-04-03 ENCOUNTER — Other Ambulatory Visit: Payer: Self-pay

## 2020-04-03 ENCOUNTER — Telehealth: Payer: Self-pay | Admitting: *Deleted

## 2020-04-03 ENCOUNTER — Ambulatory Visit (INDEPENDENT_AMBULATORY_CARE_PROVIDER_SITE_OTHER): Payer: Medicare Other | Admitting: Podiatry

## 2020-04-03 DIAGNOSIS — M5431 Sciatica, right side: Secondary | ICD-10-CM | POA: Diagnosis not present

## 2020-04-03 DIAGNOSIS — M79676 Pain in unspecified toe(s): Secondary | ICD-10-CM

## 2020-04-03 DIAGNOSIS — L600 Ingrowing nail: Secondary | ICD-10-CM

## 2020-04-03 DIAGNOSIS — M7751 Other enthesopathy of right foot: Secondary | ICD-10-CM | POA: Diagnosis not present

## 2020-04-03 NOTE — Progress Notes (Signed)
  Subjective:  Patient ID: Cody [name], male    DOB: 06-Mar-1961,  MRN: [id number]  Chief Complaint  Patient presents with  . Nail Problem    LT hallux toenail injury (hit against the wall) x yesterday; pt states after hitting toenail he clipped the nail and noticed that the medial side is soming off and loose -w/ soreness -pt denies redness/swelling/drainage Tx: peroxide and taping -pt not diabetic   59 y.o. male presents with the above complaint. History confirmed with patient.   Also complains of pain to the ball of hte right foot. Complains of chronic sciatica to the right side for which he would like care.  Objective:  Physical Exam: warm, good capillary refill, no trophic changes or ulcerative lesions, normal DP and PT pulses and normal sensory exam. Left Foot: hallux partial lysis  Right Foot: POP 2nd MPJ  Assessment:   1. Ingrown nail   2. Pain around toenail   3. Capsulitis of metatarsophalangeal (MTP) joint of right foot   4. Sciatica of right side      Plan:  Patient was evaluated and treated and all questions answered.  Capsulitis R 2nd MPJ -Educated on etiology -Injection delivered to the painful joint  Procedure: Joint Injection Location: Right 2nd joint Skin Prep: Alcohol. Injectate: 0.5 cc 1% lidocaine plain, 0.5 cc dexamethasone phosphate. Disposition: Patient tolerated procedure well. Injection site dressed with a band-aid.   Left great toenail injury -Nail debrided. No nail bed injurhy  Sciatica -Will process referral  Return for Capsulitis, Right; , Left hallux nail f/u .

## 2020-04-03 NOTE — Telephone Encounter (Signed)
Faxed referral, [employer] with clinicals and insurance to [facility].

## 2020-04-03 NOTE — Telephone Encounter (Signed)
-----   Message from Park Liter, DPM sent at 04/03/2020  9:53 AM EDT ----- Can we place a referral to neurology for Sciatica? Cody [name] patient

## 2020-04-03 NOTE — Patient Instructions (Signed)

## 2020-04-11 ENCOUNTER — Encounter: Payer: Self-pay | Admitting: Cardiology

## 2020-04-11 ENCOUNTER — Other Ambulatory Visit: Payer: Self-pay

## 2020-04-11 ENCOUNTER — Ambulatory Visit (INDEPENDENT_AMBULATORY_CARE_PROVIDER_SITE_OTHER): Payer: Medicare Other | Admitting: Cardiology

## 2020-04-11 VITALS — BP 128/78 | HR 70 | Ht 66.0 in | Wt 173.6 lb

## 2020-04-11 DIAGNOSIS — E785 Hyperlipidemia, unspecified: Secondary | ICD-10-CM

## 2020-04-11 DIAGNOSIS — I739 Peripheral vascular disease, unspecified: Secondary | ICD-10-CM

## 2020-04-11 DIAGNOSIS — I2510 Atherosclerotic heart disease of native coronary artery without angina pectoris: Secondary | ICD-10-CM

## 2020-04-11 DIAGNOSIS — I10 Essential (primary) hypertension: Secondary | ICD-10-CM | POA: Diagnosis not present

## 2020-04-11 MED ORDER — EZETIMIBE 10 MG PO TABS
10.0000 mg | ORAL_TABLET | Freq: Every day | ORAL | 3 refills | Status: DC
Start: 2020-04-11 — End: 2021-01-03

## 2020-04-11 NOTE — Progress Notes (Signed)
Cardiology Office Note:    Date:  04/11/2020   ID:  Cody [name], DOB 12-Nov-1960, MRN [id number]  PCP:  Ailene Ravel, MD  Cardiologist:  Thomasene Ripple, DO  Electrophysiologist:  None   Referring MD: Ailene Ravel, MD   " I am doing well"   History of Present Illness:    Cody [name] is a 59 y.o. male with a hx of male with a hx of coronary disease status post recent left heart catheterization on 03/30/2020 at that time his previous stent was widely patent, he did have some moderate disease in his LAD as well as his left circumflex artery.  Left circumflex artery was DFR was 0.99 resting that this was not a significant stenosis, dyslipidemia, hypertension, PAD and tobacco use presents today for follow-up visit.   He denies any chest pain since his last recent cath.   Past Medical History:  Diagnosis Date  . Cocaine abuse (HCC) 09/20/2012  . Coronary artery disease   . Dyslipidemia 09/20/2012  . GERD (gastroesophageal reflux disease)    has stomach ulcers a while back  . HTN (hypertension)   . PAD (peripheral artery disease) (HCC) 09/20/2012  . Smoker 09/20/2012    Past Surgical History:  Procedure Laterality Date  . CORONARY STENT PLACEMENT    . INTRAVASCULAR PRESSURE WIRE/FFR STUDY N/A 03/30/2020   Procedure: INTRAVASCULAR PRESSURE WIRE/FFR STUDY;  Surgeon: Kathleene Hazel, MD;  Location: [facility] IN[facility]ASIVE CV [facility];  Service: Cardiovascular;  Laterality: N/A;  . LEFT HEART CATH AND CORONARY ANGIOGRAPHY N/A 03/30/2020   Procedure: LEFT HEART CATH AND CORONARY ANGIOGRAPHY;  Surgeon: Kathleene Hazel, MD;  Location: [facility] CV [facility];  Service: Cardiovascular;  Laterality: N/A;  . LEFT HEART CATHETERIZATION WITH CORONARY ANGIOGRAM N/A 09/24/2012   Procedure: LEFT HEART CATHETERIZATION WITH CORONARY ANGIOGRAM;  Surgeon: Lesleigh Noe, MD;  Location: [facility] CATH LAB;  Service: Cardiovascular;  Laterality: N/A;    Current Medications: Current Meds  Medication Sig  .  amLODipine (NORVASC) 10 MG tablet Take 1 tablet (10 mg total) by mouth daily.  Marland Kitchen aspirin EC 81 MG tablet Take 1 tablet (81 mg total) by mouth daily.  Marland Kitchen atorvastatin (LIPITOR) 40 MG tablet Take 1 tablet (40 mg total) by mouth daily.  . carvedilol (COREG) 12.5 MG tablet Take 1 tablet (12.5 mg total) by mouth 2 (two) times daily.  . cloNIDine (CATAPRES) 0.1 MG tablet Take 1 tablet (0.1 mg total) by mouth 3 (three) times daily.  . fluticasone (FLONASE) 50 MCG/ACT nasal spray Place 1 spray into both nostrils daily as needed for allergies or rhinitis.  . Multiple Vitamins-Minerals (MULTIVITAMIN WITH MINERALS) tablet Take 1 tablet by mouth daily.  . nitroGLYCERIN (NITROSTAT) 0.4 MG SL tablet Place 1 tablet (0.4 mg total) under the tongue every 5 (five) minutes as needed for chest pain.  . pantoprazole (PROTONIX) 40 MG tablet Take 1 tablet (40 mg total) by mouth daily.  . ranolazine (RANEXA) 500 MG 12 hr tablet Take 500 mg by mouth 2 (two) times daily.  . simethicone (MYLICON) 80 MG chewable tablet Chew 2 tablets (160 mg total) by mouth 3 (three) times daily as needed for flatulence.     Allergies:   Contrast media [iodinated diagnostic agents], Peanuts [peanut oil], and Plavix [clopidogrel]   Social History   Socioeconomic History  . Marital status: Married    Spouse name: Not on file  . Number of children: Not on file  . Years of education: Not on  file  . Highest education level: Not on file  Occupational History  . Not on file  Tobacco Use  . Smoking status: Current Every Day Smoker    Types: Cigarettes  . Smokeless tobacco: Never Used  Vaping Use  . Vaping Use: Never assessed  Substance and Sexual Activity  . Alcohol use: Yes    Alcohol/week: 3.0 - 4.0 standard drinks    Types: 2 Cans of beer, 1 - 2 Shots of liquor per week  . Drug use: Yes    Types: Cocaine  . Sexual activity: Not on file  Other Topics Concern  . Not on file  Social History Narrative  . Not on file   Social  Determinants of Health   Financial Resource Strain:   . Difficulty of Paying Living Expenses:   Food Insecurity:   . Worried About Programme researcher, broadcasting/film/videounning Out of Food in the Last Year:   . Baristaan Out of Food in the Last Year:   Transportation Needs:   . Freight forwarderLack of Transportation (Medical):   Marland Kitchen. Lack of Transportation (Non-Medical):   Physical Activity:   . Days of Exercise per Week:   . Minutes of Exercise per Session:   Stress:   . Feeling of Stress :   Social Connections:   . Frequency of Communication with Friends and Family:   . Frequency of Social Gatherings with Friends and Family:   . Attends Religious Services:   . Active Member of Clubs or Organizations:   . Attends BankerClub or Organization Meetings:   Marland Kitchen. Marital Status:      Family History: The patient's family history includes CAD in his brother; Diabetes in his father; Hypertension in his mother.  ROS:   Review of Systems  Constitution: Negative for decreased appetite, fever and weight gain.  HENT: Negative for congestion, ear discharge, hoarse voice and sore throat.   Eyes: Negative for discharge, redness, vision loss in right eye and visual halos.  Cardiovascular: Negative for chest pain, dyspnea on exertion, leg swelling, orthopnea and palpitations.  Respiratory: Negative for cough, hemoptysis, shortness of breath and snoring.   Endocrine: Negative for heat intolerance and polyphagia.  Hematologic/Lymphatic: Negative for bleeding problem. Does not bruise/bleed easily.  Skin: Negative for flushing, nail changes, rash and suspicious lesions.  Musculoskeletal: Negative for arthritis, joint pain, muscle cramps, myalgias, neck pain and stiffness.  Gastrointestinal: Negative for abdominal pain, bowel incontinence, diarrhea and excessive appetite.  Genitourinary: Negative for decreased libido, genital sores and incomplete emptying.  Neurological: Negative for brief paralysis, focal weakness, headaches and loss of balance.    Psychiatric/Behavioral: Negative for altered mental status, depression and suicidal ideas.  Allergic/Immunologic: Negative for HIV exposure and persistent infections.    EKGs/Labs/Other Studies Reviewed:    The following studies were reviewed today:   EKG:  None today   LHC March 30, 2020  Prox RCA-1 lesion is 30% stenosed.  Prox RCA-2 lesion is 10% stenosed.  Prox RCA to Mid RCA lesion is 30% stenosed.  Mid RCA lesion is 10% stenosed.  Dist RCA lesion is 30% stenosed.  RPAV lesion is 30% stenosed.  Mid Cx lesion is 60% stenosed.  Mid LAD-1 lesion is 50% stenosed.  Previously placed Mid LAD-2 stent (unknown type) is widely patent.   1. Patent stent mid LAD without restenosis. Moderate mid LAD stenosis prior to the old stent, unchanged from last cath.  2. Moderate mid Circumflex stenosis. DFR of this lesion was 0.99 suggesting the stenosis is not significant 3. The  RCA has overlapping mid stents and a distal stent, all are patent without restenosis.   Recommendations: Continue medical management of CAD   Recent Labs: 04/01/2020: BUN 14; Creatinine, Ser 0.97; Hemoglobin 11.8; Platelets 221; Potassium 3.2; Sodium 140  Recent Lipid Panel    Component Value Date/Time   CHOL 164 03/30/2020 0648   TRIG 31 03/30/2020 0648   HDL 46 03/30/2020 0648   CHOLHDL 3.6 03/30/2020 0648   VLDL 6 03/30/2020 0648   LDLCALC 112 (H) 03/30/2020 0648    Physical Exam:    VS:  BP 128/78   Pulse 70   Ht 5\' 6"  (1.676 m)   Wt 173 lb 9.6 oz (78.7 kg)   SpO2 98%   BMI 28.02 kg/m     Wt Readings from Last 3 Encounters:  04/11/20 173 lb 9.6 oz (78.7 kg)  03/29/20 174 lb 9.7 oz (79.2 kg)  12/30/19 178 lb (80.7 kg)     GEN: Well nourished, well developed in no acute distress HEENT: Normal NECK: No JVD; No carotid bruits LYMPHATICS: No lymphadenopathy CARDIAC: S1S2 noted,RRR, no murmurs, rubs, gallops RESPIRATORY:  Clear to auscultation without rales, wheezing or rhonchi   ABDOMEN: Soft, non-tender, non-distended, +bowel sounds, no guarding. EXTREMITIES: No edema, No cyanosis, no clubbing MUSCULOSKELETAL:  No deformity  SKIN: Warm and dry NEUROLOGIC:  Alert and oriented x 3, non-focal PSYCHIATRIC:  Normal affect, good insight  ASSESSMENT:    1. Coronary artery disease involving native coronary artery of native heart without angina pectoris   2. Dyslipidemia   3. Essential hypertension   4. PAD (peripheral artery disease) (HCC)    PLAN:     Status post recent catheterization with no stent placement.  Medical management recommended.  He is currently on aspirin 81 mg daily, atorvastatin 40 mg a day.  I am going to add Zetia 10 mg daily to his medication regimen his recent LDL was 112 his target  is 70.  He will also continue Ranexa 500 mg twice a day, as well as his beta-blocker 12.5 mg of Coreg twice daily.  I was able to reconcile his medication with him in the office today.  Dyslipidemia-he is currently on atorvastatin 40 mg daily.  Adding Zetia 10 mg daily today.  I do believe he will be a great candidate for PCSK9 inhibitor if he cannot reach his target on the atorvastatin with Zetia.  Lipid profile will be done in 2 weeks.  PAD-he denies any signs of claudication.  The patient is in agreement with the above plan. The patient left the office in stable condition.  The patient will follow up in 3 months or sooner if needed for reevaluation for PCSK9 inhibitors.   Medication Adjustments/Labs and Tests Ordered: Current medicines are reviewed at length with the patient today.  Concerns regarding medicines are outlined above.  Orders Placed This Encounter  Procedures  . Lipid Profile   Meds ordered this encounter  Medications  . ezetimibe (ZETIA) 10 MG tablet    Sig: Take 1 tablet (10 mg total) by mouth daily.    Dispense:  90 tablet    Refill:  3    Patient Instructions  Medication Instructions:  Your physician has recommended you make the  following change in your medication:  START: Zetia 10 mg take one tablet by mouth daily.  *If you need a refill on your cardiac medications before your next appointment, please call your pharmacy*   Lab Work: Your physician recommends that you return  for lab work in: One week prior to your next appointment Lipids If you have labs (blood work) drawn today and your tests are completely normal, you will receive your results only by: Marland Kitchen MyChart Message (if you have MyChart) OR . A paper copy in the mail If you have any lab test that is abnormal or we need to change your treatment, we will call you to review the results.   Testing/Procedures: None   Follow-Up: At [facility], you and your health needs are our priority.  As part of our continuing mission to provide you with exceptional heart care, we have created designated Provider Care Teams.  These Care Teams include your primary Cardiologist (physician) and Advanced Practice Providers (APPs -  Physician Assistants and Nurse Practitioners) who all work together to provide you with the care you need, when you need it.  We recommend signing up for the patient portal called "MyChart".  Sign up information is provided on this After Visit Summary.  MyChart is used to connect with patients for Virtual Visits (Telemedicine).  Patients are able to view lab/test results, encounter notes, upcoming appointments, etc.  Non-urgent messages can be sent to your provider as well.   To learn more about what you can do with MyChart, go to [email or web address].    Your next appointment:   3 month(s)  The format for your next appointment:   In Person  Provider:   Thomasene Ripple, DO   Other Instructions      Adopting a Healthy Lifestyle.  Know what a healthy weight is for you (roughly BMI <25) and aim to maintain this   Aim for 7+ servings of fruits and vegetables daily   65-80+ fluid ounces of water or unsweet tea for healthy kidneys     Limit to max 1 drink of alcohol per day; avoid smoking/tobacco   Limit animal fats in diet for cholesterol and heart health - choose grass fed whenever available   Avoid highly processed foods, and foods high in saturated/trans fats   Aim for low stress - take time to unwind and care for your mental health   Aim for 150 min of moderate intensity exercise weekly for heart health, and weights twice weekly for bone health   Aim for 7-9 hours of sleep daily   When it comes to diets, agreement about the perfect plan isnt easy to find, even among the experts. Experts at the [facility] [employer] developed an idea known as the Healthy Eating Plate. Just imagine a plate divided into logical, healthy portions.   The emphasis is on diet quality:   Load up on vegetables and fruits - one-half of your plate: Aim for color and variety, and remember that potatoes dont count.   Go for whole grains - one-quarter of your plate: Whole wheat, barley, wheat berries, quinoa, oats, brown rice, and foods made with them. If you want pasta, go with whole wheat pasta.   Protein power - one-quarter of your plate: Fish, chicken, beans, and nuts are all healthy, versatile protein sources. Limit red meat.   The diet, however, does go beyond the plate, offering a few other suggestions.   Use healthy plant oils, such as olive, canola, soy, corn, sunflower and peanut. Check the labels, and avoid partially hydrogenated oil, which have unhealthy trans fats.   If youre thirsty, drink water. Coffee and tea are good in moderation, but skip sugary drinks and limit milk and dairy products to one or  two daily servings.   The type of carbohydrate in the diet is more important than the amount. Some sources of carbohydrates, such as vegetables, fruits, whole grains, and beans-are healthier than others.   Finally, stay active  Signed, Thomasene Ripple, DO  04/11/2020 8:23 AM    [facility]

## 2020-04-11 NOTE — Patient Instructions (Signed)
Medication Instructions:  Your physician has recommended you make the following change in your medication:  START: Zetia 10 mg take one tablet by mouth daily.  *If you need a refill on your cardiac medications before your next appointment, please call your pharmacy*   Lab Work: Your physician recommends that you return for lab work in: One week prior to your next appointment Lipids If you have labs (blood work) drawn today and your tests are completely normal, you will receive your results only by: Marland Kitchen MyChart Message (if you have MyChart) OR . A paper copy in the mail If you have any lab test that is abnormal or we need to change your treatment, we will call you to review the results.   Testing/Procedures: None   Follow-Up: At [facility], you and your health needs are our priority.  As part of our continuing mission to provide you with exceptional heart care, we have created designated Provider Care Teams.  These Care Teams include your primary Cardiologist (physician) and Advanced Practice Providers (APPs -  Physician Assistants and Nurse Practitioners) who all work together to provide you with the care you need, when you need it.  We recommend signing up for the patient portal called "MyChart".  Sign up information is provided on this After Visit Summary.  MyChart is used to connect with patients for Virtual Visits (Telemedicine).  Patients are able to view lab/test results, encounter notes, upcoming appointments, etc.  Non-urgent messages can be sent to your provider as well.   To learn more about what you can do with MyChart, go to [email or web address].    Your next appointment:   3 month(s)  The format for your next appointment:   In Person  Provider:   Thomasene Ripple, DO   Other Instructions

## 2020-05-17 ENCOUNTER — Ambulatory Visit (INDEPENDENT_AMBULATORY_CARE_PROVIDER_SITE_OTHER): Payer: Medicare Other | Admitting: Podiatry

## 2020-05-17 DIAGNOSIS — Z5329 Procedure and treatment not carried out because of patient's decision for other reasons: Secondary | ICD-10-CM

## 2020-05-17 NOTE — Progress Notes (Signed)
No show for appt. 

## 2020-05-30 DIAGNOSIS — I2510 Atherosclerotic heart disease of native coronary artery without angina pectoris: Secondary | ICD-10-CM | POA: Diagnosis present

## 2020-05-30 DIAGNOSIS — M5431 Sciatica, right side: Secondary | ICD-10-CM

## 2020-05-30 HISTORY — DX: Atherosclerotic heart disease of native coronary artery without angina pectoris: I25.10

## 2020-05-30 HISTORY — DX: Sciatica, right side: M54.31

## 2020-07-13 ENCOUNTER — Other Ambulatory Visit: Payer: Self-pay

## 2020-07-13 ENCOUNTER — Other Ambulatory Visit: Payer: Self-pay | Admitting: Physician Assistant

## 2020-07-13 ENCOUNTER — Emergency Department (HOSPITAL_COMMUNITY): Payer: Medicare Other

## 2020-07-13 ENCOUNTER — Inpatient Hospital Stay (HOSPITAL_COMMUNITY)
Admission: EM | Admit: 2020-07-13 | Discharge: 2020-07-20 | DRG: 918 | Disposition: A | Discharge status: Home / Self Care | Payer: Medicare Other | Attending: Internal Medicine | Admitting: Internal Medicine

## 2020-07-13 DIAGNOSIS — R262 Difficulty in walking, not elsewhere classified: Secondary | ICD-10-CM | POA: Diagnosis present

## 2020-07-13 DIAGNOSIS — K449 Diaphragmatic hernia without obstruction or gangrene: Secondary | ICD-10-CM | POA: Diagnosis present

## 2020-07-13 DIAGNOSIS — Z20822 Contact with and (suspected) exposure to covid-19: Secondary | ICD-10-CM | POA: Diagnosis present

## 2020-07-13 DIAGNOSIS — Z8249 Family history of ischemic heart disease and other diseases of the circulatory system: Secondary | ICD-10-CM

## 2020-07-13 DIAGNOSIS — T405X1A Poisoning by cocaine, accidental (unintentional), initial encounter: Secondary | ICD-10-CM | POA: Diagnosis not present

## 2020-07-13 DIAGNOSIS — R079 Chest pain, unspecified: Secondary | ICD-10-CM | POA: Diagnosis present

## 2020-07-13 DIAGNOSIS — I5032 Chronic diastolic (congestive) heart failure: Secondary | ICD-10-CM | POA: Diagnosis present

## 2020-07-13 DIAGNOSIS — E876 Hypokalemia: Secondary | ICD-10-CM | POA: Diagnosis present

## 2020-07-13 DIAGNOSIS — R252 Cramp and spasm: Secondary | ICD-10-CM | POA: Diagnosis present

## 2020-07-13 DIAGNOSIS — K219 Gastro-esophageal reflux disease without esophagitis: Secondary | ICD-10-CM | POA: Diagnosis present

## 2020-07-13 DIAGNOSIS — Z7982 Long term (current) use of aspirin: Secondary | ICD-10-CM

## 2020-07-13 DIAGNOSIS — E785 Hyperlipidemia, unspecified: Secondary | ICD-10-CM | POA: Diagnosis present

## 2020-07-13 DIAGNOSIS — G8929 Other chronic pain: Secondary | ICD-10-CM

## 2020-07-13 DIAGNOSIS — F17210 Nicotine dependence, cigarettes, uncomplicated: Secondary | ICD-10-CM | POA: Diagnosis present

## 2020-07-13 DIAGNOSIS — Z833 Family history of diabetes mellitus: Secondary | ICD-10-CM

## 2020-07-13 DIAGNOSIS — I110 Hypertensive heart disease with heart failure: Secondary | ICD-10-CM | POA: Diagnosis present

## 2020-07-13 DIAGNOSIS — G629 Polyneuropathy, unspecified: Secondary | ICD-10-CM | POA: Diagnosis present

## 2020-07-13 DIAGNOSIS — H53149 Visual discomfort, unspecified: Secondary | ICD-10-CM | POA: Diagnosis present

## 2020-07-13 DIAGNOSIS — K222 Esophageal obstruction: Secondary | ICD-10-CM | POA: Diagnosis present

## 2020-07-13 DIAGNOSIS — I739 Peripheral vascular disease, unspecified: Secondary | ICD-10-CM | POA: Diagnosis present

## 2020-07-13 DIAGNOSIS — Z8711 Personal history of peptic ulcer disease: Secondary | ICD-10-CM

## 2020-07-13 DIAGNOSIS — H5712 Ocular pain, left eye: Secondary | ICD-10-CM | POA: Diagnosis present

## 2020-07-13 DIAGNOSIS — I10 Essential (primary) hypertension: Secondary | ICD-10-CM | POA: Diagnosis present

## 2020-07-13 DIAGNOSIS — F1410 Cocaine abuse, uncomplicated: Secondary | ICD-10-CM | POA: Diagnosis present

## 2020-07-13 DIAGNOSIS — Z91010 Allergy to peanuts: Secondary | ICD-10-CM

## 2020-07-13 DIAGNOSIS — I25110 Atherosclerotic heart disease of native coronary artery with unstable angina pectoris: Secondary | ICD-10-CM | POA: Diagnosis present

## 2020-07-13 DIAGNOSIS — F419 Anxiety disorder, unspecified: Secondary | ICD-10-CM | POA: Diagnosis present

## 2020-07-13 DIAGNOSIS — Z23 Encounter for immunization: Secondary | ICD-10-CM

## 2020-07-13 DIAGNOSIS — Z91041 Radiographic dye allergy status: Secondary | ICD-10-CM

## 2020-07-13 DIAGNOSIS — Z888 Allergy status to other drugs, medicaments and biological substances status: Secondary | ICD-10-CM

## 2020-07-13 DIAGNOSIS — I2510 Atherosclerotic heart disease of native coronary artery without angina pectoris: Secondary | ICD-10-CM | POA: Diagnosis present

## 2020-07-13 DIAGNOSIS — K279 Peptic ulcer, site unspecified, unspecified as acute or chronic, without hemorrhage or perforation: Secondary | ICD-10-CM | POA: Diagnosis present

## 2020-07-13 DIAGNOSIS — K2970 Gastritis, unspecified, without bleeding: Secondary | ICD-10-CM | POA: Diagnosis present

## 2020-07-13 DIAGNOSIS — Y929 Unspecified place or not applicable: Secondary | ICD-10-CM

## 2020-07-13 DIAGNOSIS — Z79899 Other long term (current) drug therapy: Secondary | ICD-10-CM

## 2020-07-13 DIAGNOSIS — B3781 Candidal esophagitis: Secondary | ICD-10-CM | POA: Diagnosis present

## 2020-07-13 DIAGNOSIS — G4700 Insomnia, unspecified: Secondary | ICD-10-CM | POA: Diagnosis present

## 2020-07-13 DIAGNOSIS — Z955 Presence of coronary angioplasty implant and graft: Secondary | ICD-10-CM

## 2020-07-13 LAB — CBC WITH DIFFERENTIAL/PLATELET
Abs Immature Granulocytes: 0.04 10*3/uL (ref 0.00–0.07)
Basophils Absolute: 0.1 10*3/uL (ref 0.0–0.1)
Basophils Relative: 1 %
Eosinophils Absolute: 0.5 10*3/uL (ref 0.0–0.5)
Eosinophils Relative: 5 %
HCT: 38.6 % — ABNORMAL LOW (ref 39.0–52.0)
Hemoglobin: 12.7 g/dL — ABNORMAL LOW (ref 13.0–17.0)
Immature Granulocytes: 0 %
Lymphocytes Relative: 39 %
Lymphs Abs: 3.8 10*3/uL (ref 0.7–4.0)
MCH: 29.2 pg (ref 26.0–34.0)
MCHC: 32.9 g/dL (ref 30.0–36.0)
MCV: 88.7 fL (ref 80.0–100.0)
Monocytes Absolute: 0.7 10*3/uL (ref 0.1–1.0)
Monocytes Relative: 7 %
Neutro Abs: 4.7 10*3/uL (ref 1.7–7.7)
Neutrophils Relative %: 48 %
Platelets: 270 10*3/uL (ref 150–400)
RBC: 4.35 MIL/uL (ref 4.22–5.81)
RDW: 12.7 % (ref 11.5–15.5)
WBC: 9.8 10*3/uL (ref 4.0–10.5)
nRBC: 0 % (ref 0.0–0.2)

## 2020-07-13 LAB — BASIC METABOLIC PANEL
Anion gap: 12 (ref 5–15)
BUN: 13 mg/dL (ref 6–20)
CO2: 25 mmol/L (ref 22–32)
Calcium: 9.2 mg/dL (ref 8.9–10.3)
Chloride: 104 mmol/L (ref 98–111)
Creatinine, Ser: 1.07 mg/dL (ref 0.61–1.24)
GFR, Estimated: >60 mL/min (ref >60)
Glucose, Bld: 86 mg/dL (ref 70–99)
Potassium: 3.1 mmol/L — ABNORMAL LOW (ref 3.5–5.1)
Sodium: 141 mmol/L (ref 135–145)

## 2020-07-13 LAB — RESPIRATORY PANEL BY RT PCR (FLU A&B, COVID)
Influenza A by PCR: NEGATIVE
Influenza B by PCR: NEGATIVE
SARS Coronavirus 2 by RT PCR: NEGATIVE

## 2020-07-13 LAB — TROPONIN I (HIGH SENSITIVITY)
Troponin I (High Sensitivity): 26 ng/L — ABNORMAL HIGH (ref <18)
Troponin I (High Sensitivity): 23 ng/L — ABNORMAL HIGH (ref <18)
Troponin I (High Sensitivity): 21 ng/L — ABNORMAL HIGH (ref <18)

## 2020-07-13 MED ORDER — RANOLAZINE ER 500 MG PO TB12
500.0000 mg | ORAL_TABLET | Freq: Two times a day (BID) | ORAL | Status: DC
  Administered 2020-07-13 – 2020-07-20 (×14): 500 mg via ORAL
  Filled 2020-07-13 (×14): qty 1

## 2020-07-13 MED ORDER — ASPIRIN EC 81 MG PO TBEC
81.0000 mg | DELAYED_RELEASE_TABLET | Freq: Every day | ORAL | Status: DC
  Administered 2020-07-14 – 2020-07-15 (×2): 81 mg via ORAL
  Filled 2020-07-13 (×2): qty 1

## 2020-07-13 MED ORDER — ASPIRIN 81 MG PO CHEW
324.0000 mg | CHEWABLE_TABLET | Freq: Once | ORAL | Status: DC
  Filled 2020-07-13: qty 4

## 2020-07-13 MED ORDER — NITROGLYCERIN 0.4 MG SL SUBL
0.4000 mg | SUBLINGUAL_TABLET | SUBLINGUAL | Status: DC | PRN
  Administered 2020-07-13 – 2020-07-17 (×2): 0.4 mg via SUBLINGUAL
  Filled 2020-07-13 (×2): qty 1

## 2020-07-13 MED ORDER — CARVEDILOL 12.5 MG PO TABS
12.5000 mg | ORAL_TABLET | Freq: Two times a day (BID) | ORAL | Status: DC
  Administered 2020-07-13 – 2020-07-20 (×14): 12.5 mg via ORAL
  Filled 2020-07-13 (×14): qty 1

## 2020-07-13 MED ORDER — GABAPENTIN 300 MG PO CAPS
300.0000 mg | ORAL_CAPSULE | Freq: Two times a day (BID) | ORAL | Status: DC
  Administered 2020-07-13 – 2020-07-20 (×14): 300 mg via ORAL
  Filled 2020-07-13 (×14): qty 1

## 2020-07-13 MED ORDER — AMLODIPINE BESYLATE 10 MG PO TABS
10.0000 mg | ORAL_TABLET | Freq: Every day | ORAL | Status: DC
  Administered 2020-07-13 – 2020-07-20 (×8): 10 mg via ORAL
  Filled 2020-07-13 (×8): qty 1

## 2020-07-13 MED ORDER — MAGNESIUM SULFATE 2 GM/50ML IV SOLN
2.0000 g | Freq: Once | INTRAVENOUS | Status: AC
  Administered 2020-07-13: 2 g via INTRAVENOUS
  Filled 2020-07-13: qty 50

## 2020-07-13 MED ORDER — ENOXAPARIN SODIUM 40 MG/0.4ML ~~LOC~~ SOLN
40.0000 mg | SUBCUTANEOUS | Status: DC
  Administered 2020-07-13 – 2020-07-19 (×7): 40 mg via SUBCUTANEOUS
  Filled 2020-07-13 (×7): qty 0.4

## 2020-07-13 MED ORDER — LORAZEPAM 2 MG/ML IJ SOLN
0.5000 mg | Freq: Once | INTRAMUSCULAR | Status: AC
  Administered 2020-07-13: 0.5 mg via INTRAVENOUS
  Filled 2020-07-13: qty 1

## 2020-07-13 MED ORDER — CLONIDINE HCL 0.1 MG PO TABS
0.1000 mg | ORAL_TABLET | Freq: Three times a day (TID) | ORAL | Status: DC
  Administered 2020-07-13 – 2020-07-20 (×20): 0.1 mg via ORAL
  Filled 2020-07-13 (×20): qty 1

## 2020-07-13 MED ORDER — HYDRALAZINE HCL 20 MG/ML IJ SOLN: 10.0000 mg | Freq: Four times a day (QID) | INTRAMUSCULAR | Status: DC | PRN

## 2020-07-13 MED ORDER — POTASSIUM CHLORIDE CRYS ER 20 MEQ PO TBCR
40.0000 meq | EXTENDED_RELEASE_TABLET | Freq: Once | ORAL | Status: AC
  Administered 2020-07-13: 40 meq via ORAL
  Filled 2020-07-13: qty 2

## 2020-07-13 MED ORDER — ATORVASTATIN CALCIUM 40 MG PO TABS
40.0000 mg | ORAL_TABLET | Freq: Every day | ORAL | Status: DC
  Administered 2020-07-13 – 2020-07-17 (×5): 40 mg via ORAL
  Filled 2020-07-13 (×5): qty 1

## 2020-07-13 MED ORDER — POTASSIUM CHLORIDE CRYS ER 20 MEQ PO TBCR
40.0000 meq | EXTENDED_RELEASE_TABLET | Freq: Two times a day (BID) | ORAL | Status: AC
  Administered 2020-07-13 – 2020-07-14 (×2): 40 meq via ORAL
  Filled 2020-07-13 (×2): qty 2

## 2020-07-13 MED ORDER — PANTOPRAZOLE SODIUM 40 MG PO TBEC
40.0000 mg | DELAYED_RELEASE_TABLET | Freq: Every day | ORAL | Status: DC
  Administered 2020-07-13 – 2020-07-14 (×2): 40 mg via ORAL
  Filled 2020-07-13 (×2): qty 1

## 2020-07-13 MED ORDER — EZETIMIBE 10 MG PO TABS
10.0000 mg | ORAL_TABLET | Freq: Every day | ORAL | Status: DC
  Administered 2020-07-13 – 2020-07-19 (×7): 10 mg via ORAL
  Filled 2020-07-13 (×8): qty 1

## 2020-07-13 NOTE — ED Notes (Signed)
Dinner Tray Ordered @ 1737-per Irving Burton, RN called by Marylene Land

## 2020-07-13 NOTE — ED Notes (Signed)
Pt says he needs to eat because he will get a lot sicker. States, "My mom used to say if I didn't eat, I would get sick and have to go to the hospital".

## 2020-07-13 NOTE — ED Notes (Addendum)
Once provider in the room, pt reports his CP has been going on for a month. Pt now reports his pain is sharp and not pressure as he previously endorsed to this RN. Now reporting it is hard to swallow. Now reporting having a fever after provider asked if he had any fevers. Pt story continues to change. Pt reports now he has PNA from the flu shot. Pt now reports a knot under his R wrist. Pt states, "Please whatever you do, when I was in [place], the nurse came and gave me something that was clear and she left out. It was a mistake, I had to hit the button. She came back and I looked like I came out of the pool"

## 2020-07-13 NOTE — ED Notes (Signed)
Now complaining of headache as well

## 2020-07-13 NOTE — ED Notes (Signed)
Transported to xray 

## 2020-07-13 NOTE — ED Notes (Addendum)
Attempted IV start and blood draw x 2

## 2020-07-13 NOTE — ED Triage Notes (Signed)
Pt presents to the ED via [facility] w/ complaints of CP, shob, numbness and sharp pain from R arm to chest. CP onset 3-5 days ago intermittent described as pressure. Upon EMS arrival pt was tachypniec. EMS reports pt took 1 nitro prior to their arrival and pt reports nitro didn't work. Pt appears very anxious upon arrival and has increased RR. Pt A&Ox4 in NAD.

## 2020-07-13 NOTE — ED Notes (Signed)
[place] MD at bedside. 

## 2020-07-13 NOTE — ED Provider Notes (Signed)
[facility] EMERGENCY DEPARTMENT Provider Note   CSN: [id number] Arrival date & time: 07/13/20  1131     History Chief Complaint  Patient presents with  . Chest Pain  . Shortness of Breath  . Anxiety    Adelbert Staebell is a 59 y.o. male.  HPI 59 year old male presents with chest pain and right hand cramping.  The hand pain started this morning just prior to EMS arrival.  He states he is also been having chest pain frequently ever since his heart catheterization at the end of June.  Chest pain feels like his typical heart type pain which is both pressure and sharp.  He has to breathe fast to help relieve some of the pain.  His hand started cramping and it felt like he could not bend it though this is slowly improving.  He took a nitroglycerin at home with no relief.  EMS did not give any meds.  He did feel like he was sweating when this originally started.  No leg swelling.   Past Medical History:  Diagnosis Date  . Cocaine abuse (HCC) 09/20/2012  . Coronary artery disease   . Dyslipidemia 09/20/2012  . GERD (gastroesophageal reflux disease)    has stomach ulcers a while back  . HTN (hypertension)   . PAD (peripheral artery disease) (HCC) 09/20/2012  . Smoker 09/20/2012    Patient Active Problem List   Diagnosis Date Noted  . Hypokalemia   . CAP (community acquired pneumonia) 03/31/2020  . Groin pain, chronic, right   . Leukocytosis   . PUD (peptic ulcer disease)   . Chest pain of uncertain etiology   . Positive cardiac stress test 03/29/2020  . Chest pain 09/24/2012    Class: Acute  . Retinal vein occlusion 09/24/2012  . Constipation 09/22/2012  . Visual field defect 09/21/2012  . HTN (hypertension) 09/20/2012  . Smoker 09/20/2012  . PAD (peripheral artery disease) (HCC) 09/20/2012  . Cocaine abuse (HCC) 09/20/2012  . Dyslipidemia 09/20/2012  . Coronary artery disease     Past Surgical History:  Procedure Laterality Date  . CORONARY STENT  PLACEMENT    . INTRAVASCULAR PRESSURE WIRE/FFR STUDY N/A 03/30/2020   Procedure: INTRAVASCULAR PRESSURE WIRE/FFR STUDY;  Surgeon: Kathleene Hazel, MD;  Location: [facility] INVASIVE CV LAB;  Service: Cardiovascular;  Laterality: N/A;  . LEFT HEART CATH AND CORONARY ANGIOGRAPHY N/A 03/30/2020   Procedure: LEFT HEART CATH AND CORONARY ANGIOGRAPHY;  Surgeon: Kathleene Hazel, MD;  Location: [facility] INVASIVE CV LAB;  Service: Cardiovascular;  Laterality: N/A;  . LEFT HEART CATHETERIZATION WITH CORONARY ANGIOGRAM N/A 09/24/2012   Procedure: LEFT HEART CATHETERIZATION WITH CORONARY ANGIOGRAM;  Surgeon: Lesleigh Noe, MD;  Location: [facility] CATH LAB;  Service: Cardiovascular;  Laterality: N/A;       Family History  Problem Relation Age of Onset  . Diabetes Father   . CAD Brother   . Hypertension Mother     Social History   Tobacco Use  . Smoking status: Current Every Day Smoker    Types: Cigarettes  . Smokeless tobacco: Never Used  Vaping Use  . Vaping Use: Never assessed  Substance Use Topics  . Alcohol use: Yes    Alcohol/week: 3.0 - 4.0 standard drinks    Types: 2 Cans of beer, 1 - 2 Shots of liquor per week  . Drug use: Yes    Types: Cocaine    Home Medications Prior to Admission medications   Medication Sig Start Date End  Date Taking? Authorizing Provider  amLODipine (NORVASC) 10 MG tablet Take 1 tablet (10 mg total) by mouth daily. 04/01/20   Rodolph Bong, MD  aspirin EC 81 MG tablet Take 1 tablet (81 mg total) by mouth daily. 04/01/20   Rodolph Bong, MD  atorvastatin (LIPITOR) 40 MG tablet Take 1 tablet (40 mg total) by mouth daily. 04/01/20   Rodolph Bong, MD  carvedilol (COREG) 12.5 MG tablet Take 1 tablet (12.5 mg total) by mouth 2 (two) times daily. 04/01/20   Rodolph Bong, MD  cloNIDine (CATAPRES) 0.1 MG tablet Take 1 tablet (0.1 mg total) by mouth 3 (three) times daily. 04/01/20   Rodolph Bong, MD  ezetimibe (ZETIA) 10 MG tablet Take 1 tablet  (10 mg total) by mouth daily. 04/11/20 07/10/20  Tobb, Kardie, DO  fluticasone (FLONASE) 50 MCG/ACT nasal spray Place 1 spray into both nostrils daily as needed for allergies or rhinitis. 04/01/20   Rodolph Bong, MD  Multiple Vitamins-Minerals (MULTIVITAMIN WITH MINERALS) tablet Take 1 tablet by mouth daily.    [provider]  nitroGLYCERIN (NITROSTAT) 0.4 MG SL tablet Place 1 tablet (0.4 mg total) under the tongue every 5 (five) minutes as needed for chest pain. 04/01/20   Rodolph Bong, MD  pantoprazole (PROTONIX) 40 MG tablet Take 1 tablet (40 mg total) by mouth daily. 04/01/20   Rodolph Bong, MD  ranolazine (RANEXA) 500 MG 12 hr tablet Take 500 mg by mouth 2 (two) times daily.    [provider]  simethicone (MYLICON) 80 MG chewable tablet Chew 2 tablets (160 mg total) by mouth 3 (three) times daily as needed for flatulence. 04/01/20   Rodolph Bong, MD    Allergies    Contrast media [iodinated diagnostic agents], Peanuts [peanut oil], and Plavix [clopidogrel]  Review of Systems   Review of Systems  Constitutional: Positive for diaphoresis.  Respiratory: Positive for shortness of breath.   Cardiovascular: Positive for chest pain.  Musculoskeletal: Positive for myalgias.  All other systems reviewed and are negative.   Physical Exam Updated Vital Signs BP (!) 162/96   Pulse 63   Temp 98.5 F (36.9 C) (Oral)   Resp (!) 22   Ht 5\' 6"  (1.676 m)   Wt 78.8 kg   SpO2 98%   BMI 28.04 kg/m   Physical Exam Vitals and nursing note reviewed.  Constitutional:      General: He is not in acute distress.    Appearance: He is well-developed. He is not ill-appearing or diaphoretic.  HENT:     Head: Normocephalic and atraumatic.     Right Ear: External ear normal.     Left Ear: External ear normal.     Nose: Nose normal.  Eyes:     General:        Right eye: No discharge.        Left eye: No discharge.  Cardiovascular:     Rate and Rhythm: Normal  rate and regular rhythm.     Pulses:          Radial pulses are 2+ on the right side and 2+ on the left side.     Heart sounds: Normal heart sounds.  Pulmonary:     Effort: Pulmonary effort is normal.     Breath sounds: Normal breath sounds.  Chest:     Chest wall: Tenderness (diffuse, anterior) present.  Abdominal:     Palpations: Abdomen is soft.  Tenderness: There is no abdominal tenderness.  Musculoskeletal:     Cervical back: Neck supple.     Right lower leg: No edema.     Left lower leg: No edema.     Comments: Right hand has normal color and warmth  Skin:    General: Skin is warm and dry.  Neurological:     Mental Status: He is alert.     Comments: Normal strength in both upper extremities  Psychiatric:        Mood and Affect: Mood is not anxious.     ED Results / Procedures / Treatments   Labs (all labs ordered are listed, but only abnormal results are displayed) Labs Reviewed  BASIC METABOLIC PANEL - Abnormal; Notable for the following components:      Result Value   Potassium 3.1 (*)    All other components within normal limits  CBC WITH DIFFERENTIAL/PLATELET - Abnormal; Notable for the following components:   Hemoglobin 12.7 (*)    HCT 38.6 (*)    All other components within normal limits  TROPONIN I (HIGH SENSITIVITY) - Abnormal; Notable for the following components:   Troponin I (High Sensitivity) 26 (*)    All other components within normal limits  RAPID URINE DRUG SCREEN, HOSP PERFORMED  TROPONIN I (HIGH SENSITIVITY)    EKG EKG Interpretation  Date/Time:  Friday July 13 2020 11:47:33 EDT Ventricular Rate:  60 PR Interval:    QRS Duration: 91 QT Interval:  435 QTC Calculation: 435 R Axis:   -10 Text Interpretation: Sinus rhythm Nonspecific T abnormalities, diffuse leads Confirmed by Pricilla Loveless [phone]) on 07/13/2020 11:48:48 AM   Radiology DG Chest 2 View  Result Date: 07/13/2020 CLINICAL DATA:  Chest pain. EXAM: CHEST - 2 VIEW  COMPARISON:  March 31, 2020. FINDINGS: The heart size and mediastinal contours are within normal limits. Both lungs are clear. No pneumothorax or pleural effusion is noted. The visualized skeletal structures are unremarkable. IMPRESSION: No active cardiopulmonary disease. Electronically Signed   By: Lupita Raider M.D.   On: 07/13/2020 12:53    Procedures Procedures (including critical care time)  Medications Ordered in ED Medications  aspirin chewable tablet 324 mg (324 mg Oral Not Given 07/13/20 1256)  nitroGLYCERIN (NITROSTAT) SL tablet 0.4 mg (0.4 mg Sublingual Given 07/13/20 1254)  potassium chloride SA (KLOR-CON) CR tablet 40 mEq (40 mEq Oral Given 07/13/20 1544)    ED Course  I have reviewed the triage vital signs and the nursing notes.  Pertinent labs & imaging results that were available during my care of the patient were reviewed by me and considered in my medical decision making (see chart for details).    MDM Rules/Calculators/A&P                          Patient's ECG shows nonspecific T waves but no acute ST elevations or depressions. He is concerned his right arm pain is a stroke but there is no numbness or weakness, and he moves it freely. Not consistent with stroke. Has strong radial pulse. Unclear why his hand is hurting. No swelling or acute infection. Troponin is mildly elevated at 26, similar to Dec 2020. Cardiology consulted to see patient.  Towards the end of my visit with the patient, he is now complaining of leg weakness and stating he cannot walk.  He tells me of his chronic right-sided sciatica but also now is having left-sided symptoms.  On exam  there is no actual weakness though he seems to be somewhat limited due to pain.  He has intact sensation.  Normal DP pulses.  My suspicion of acute CNS emergency is very low.  Care transferred to Dr. Silverio LayYao with cardiology consult and second troponin pending. Final Clinical Impression(s) / ED Diagnoses Final diagnoses:  None     Rx / DC Orders ED Discharge Orders    None       Pricilla LovelessGoldston, Quintan Saldivar, MD 07/13/20 [phone]

## 2020-07-13 NOTE — H&P (Addendum)
History and Physical  Merit Maybee TWS:[id number] DOB: 17-Jan-1961 DOA: 07/13/2020  Referring physician: Dr. Silverio Lay PCP: Ailene Ravel, MD  Outpatient Specialists: Cardiology Patient coming from: Home  Chief Complaint: Chest pain  HPI: Cody [name] is a 59 y.o. male with medical history significant for cocaine abuse, coronary artery disease status post stent placement in 2013 and June 2021, essential hypertension, hyperlipidemia, peripheral artery disease who presented to [facility] ED with complaints of chest pain intermittently x2 weeks and progressive, felt worse today.  Located in the center of his chest, non radiating, and similar to previous chest pain prior to his last cath in June 2021.  Associated with intermittent nausea, numbness in his lower extremities and hands.  Presented to the ED for further evaluation and management of his symptomatology.  Seen by cardiology who recommended hospitalist admission.  ED Course: T-max 99.1.  Lab studies remarkable for elevated troponin at 26 and trended down.  Potassium 3.1.  Chest x-ray no active cardio pulmonary disease.  EKG sinus rhythm with no specific ST-T changes.  Review of Systems: Review of systems as noted in the HPI. All other systems reviewed and are negative.   Past Medical History:  Diagnosis Date  . Cocaine abuse (HCC) 09/20/2012  . Coronary artery disease   . Dyslipidemia 09/20/2012  . GERD (gastroesophageal reflux disease)    has stomach ulcers a while back  . HTN (hypertension)   . PAD (peripheral artery disease) (HCC) 09/20/2012  . Smoker 09/20/2012   Past Surgical History:  Procedure Laterality Date  . CORONARY STENT PLACEMENT    . INTRAVASCULAR PRESSURE WIRE/FFR STUDY N/A 03/30/2020   Procedure: INTRAVASCULAR PRESSURE WIRE/FFR STUDY;  Surgeon: Kathleene Hazel, MD;  Location: [facility] INVASIVE CV LAB;  Service: Cardiovascular;  Laterality: N/A;  . LEFT HEART CATH AND CORONARY ANGIOGRAPHY N/A 03/30/2020   Procedure:  LEFT HEART CATH AND CORONARY ANGIOGRAPHY;  Surgeon: Kathleene Hazel, MD;  Location: [facility] INVASIVE CV LAB;  Service: Cardiovascular;  Laterality: N/A;  . LEFT HEART CATHETERIZATION WITH CORONARY ANGIOGRAM N/A 09/24/2012   Procedure: LEFT HEART CATHETERIZATION WITH CORONARY ANGIOGRAM;  Surgeon: Lesleigh Noe, MD;  Location: [facility] CATH LAB;  Service: Cardiovascular;  Laterality: N/A;    Social History:  reports that he has been smoking cigarettes. He has never used smokeless tobacco. He reports current alcohol use of about 3.0 - 4.0 standard drinks of alcohol per week. He reports current drug use. Drug: Cocaine.   Allergies  Allergen Reactions  . Contrast Media [Iodinated Diagnostic Agents] Nausea And Vomiting    Vomiting blood  . Peanuts [Peanut Oil] Hives  . Plavix [Clopidogrel] Other (See Comments)    Damaged left eye    Family History  Problem Relation Age of Onset  . Diabetes Father   . CAD Brother   . Hypertension Mother      Prior to Admission medications   Medication Sig Start Date End Date Taking? Authorizing Provider  amLODipine (NORVASC) 10 MG tablet Take 1 tablet (10 mg total) by mouth daily. 04/01/20   Rodolph Bong, MD  aspirin EC 81 MG tablet Take 1 tablet (81 mg total) by mouth daily. 04/01/20   Rodolph Bong, MD  atorvastatin (LIPITOR) 40 MG tablet Take 1 tablet (40 mg total) by mouth daily. 04/01/20   Rodolph Bong, MD  carvedilol (COREG) 12.5 MG tablet Take 1 tablet (12.5 mg total) by mouth 2 (two) times daily. 04/01/20   Rodolph Bong, MD  cloNIDine (CATAPRES)  0.1 MG tablet Take 1 tablet (0.1 mg total) by mouth 3 (three) times daily. 04/01/20   Rodolph Bong, MD  ezetimibe (ZETIA) 10 MG tablet Take 1 tablet (10 mg total) by mouth daily. 04/11/20 07/10/20  Tobb, Kardie, DO  fluticasone (FLONASE) 50 MCG/ACT nasal spray Place 1 spray into both nostrils daily as needed for allergies or rhinitis. 04/01/20   Rodolph Bong, MD  Multiple  Vitamins-Minerals (MULTIVITAMIN WITH MINERALS) tablet Take 1 tablet by mouth daily.    [provider]  nitroGLYCERIN (NITROSTAT) 0.4 MG SL tablet Place 1 tablet (0.4 mg total) under the tongue every 5 (five) minutes as needed for chest pain. 04/01/20   Rodolph Bong, MD  pantoprazole (PROTONIX) 40 MG tablet Take 1 tablet (40 mg total) by mouth daily. 04/01/20   Rodolph Bong, MD  ranolazine (RANEXA) 500 MG 12 hr tablet Take 500 mg by mouth 2 (two) times daily.    [provider]  simethicone (MYLICON) 80 MG chewable tablet Chew 2 tablets (160 mg total) by mouth 3 (three) times daily as needed for flatulence. 04/01/20   Rodolph Bong, MD    Physical Exam: BP (!) 165/96   Pulse 61   Temp 99.1 F (37.3 C) (Oral)   Resp (!) 21   Ht 5\' 6"  (1.676 m)   Wt 78.8 kg   SpO2 99%   BMI 28.04 kg/m   . General: 59 y.o. year-old male well developed well nourished in no acute distress.  Alert and oriented x3. . Cardiovascular: Regular rate and rhythm with no rubs or gallops.  No thyromegaly or JVD noted.  No lower extremity edema. 2/4 pulses in all 4 extremities. 58 Respiratory: Clear to auscultation with no wheezes or rales. Good inspiratory effort. . Abdomen: Soft nontender nondistended with normal bowel sounds x4 quadrants. . Muskuloskeletal: No cyanosis, clubbing or edema noted bilaterally . Neuro: CN II-XII intact, strength, sensation, reflexes . Skin: No ulcerative lesions noted or rashes . Psychiatry: Judgement and insight appear normal. Mood is appropriate for condition and setting          Labs on Admission:  Basic Metabolic Panel: Recent Labs  Lab 07/13/20 1359  NA 141  K 3.1*  CL 104  CO2 25  GLUCOSE 86  BUN 13  CREATININE 1.07  CALCIUM 9.2   Liver Function Tests: No results for input(s): AST, ALT, ALKPHOS, BILITOT, PROT, ALBUMIN in the last 168 hours. No results for input(s): LIPASE, AMYLASE in the last 168 hours. No results for input(s):  AMMONIA in the last 168 hours. CBC: Recent Labs  Lab 07/13/20 1359  WBC 9.8  NEUTROABS 4.7  HGB 12.7*  HCT 38.6*  MCV 88.7  PLT 270   Cardiac Enzymes: No results for input(s): CKTOTAL, CKMB, CKMBINDEX, TROPONINI in the last 168 hours.  BNP (last 3 results) No results for input(s): BNP in the last 8760 hours.  ProBNP (last 3 results) No results for input(s): PROBNP in the last 8760 hours.  CBG: No results for input(s): GLUCAP in the last 168 hours.  Radiological Exams on Admission: DG Chest 2 View  Result Date: 07/13/2020 CLINICAL DATA:  Chest pain. EXAM: CHEST - 2 VIEW COMPARISON:  March 31, 2020. FINDINGS: The heart size and mediastinal contours are within normal limits. Both lungs are clear. No pneumothorax or pleural effusion is noted. The visualized skeletal structures are unremarkable. IMPRESSION: No active cardiopulmonary disease. Electronically Signed   By: April 02, 2020.D.  On: 07/13/2020 12:53    EKG: I independently viewed the EKG done and my findings are as followed: Sinus rhythm rate of 60 no specific ST-T changes.  Assessment/Plan Present on Admission: . Chest pain  Active Problems:   Chest pain  Chest pain rule out ACS Known history of coronary artery disease post stent placement 2013 and 2021 Last cardiac cath was in June 2021. Troponin S peaked at 26 and trended down No evidence of acute ischemia on twelve-lead EKG Cycle troponin x2 Repeat twelve-lead EKG in the morning Seen by cardiology, appreciate recommendations Obtain 2D echo to r/o pericarditis  Uncontrolled hypertension Resume home antihypertensives IV hydralazine as needed with parameters  Hypokalemia Serum potassium 3.1 Repleted orally with KCl 40 mEq x 2 doses Repeat BMP and magnesium level in the morning  History of coronary artery disease status post PCI with stent placement Resume home aspirin, Lipitor, Ranexa  Hyperlipidemia Resume home Lipitor  GERD Resume home  PPI  Chronic diastolic CHF Last 2D echo done on 12/29/2019 showed normal LVEF with grade 1 diastolic dysfunction Strict I's and O's and daily weight  Polysubstance abuse Hx of cocaine abuse UDS pending  Polyneuropathy Resume home gabapentin   DVT prophylaxis: Subcu Lovenox daily  Code Status: Full code per the patient himself.  Family Communication: None at bedside  Disposition Plan: Admit to telemetry cardiac  Consults called: Cardiology following  Admission status: Observation status   Status is: Observation    Dispo: The patient is from: Home.              Anticipated d/c is to: Home.              Anticipated d/c date is: 07/14/2020              Patient currently not stable for discharge due to ongoing work-up for chest pain, 2D echo pending.       Darlin Drop MD [facility] Pager [phone]  If 7PM-7AM, please contact night-coverage [email or web address] Password [email or web address]  07/13/2020, 7:05 PM

## 2020-07-13 NOTE — ED Provider Notes (Signed)
  Physical Exam  BP (!) 169/96   Pulse 72   Temp 98.5 F (36.9 C) (Oral)   Resp 20   Ht 5\' 6"  (1.676 m)   Wt 78.8 kg   SpO2 100%   BMI 28.04 kg/m   Physical Exam  ED Course/Procedures     Procedures  MDM  Patient care assumed at 4 pm. Patient has chest pain. Has known CAD with stent. Troponins flat around 24. Sign out pending cardiology consult   5:48 PM Cardiology saw patient and recommend admission to hospitalist service to trend troponins and get echo. Cardiology will follow      , MD 07/13/20 [phone]

## 2020-07-13 NOTE — ED Notes (Addendum)
Pt now endorsing that he can't get up and can't stand. Pt said that he couldn't stand up or walk when he went to get on the stretcher w/ EMS. Pt now c/o leg pain, R hand pain and reports his chest still hurts the same. Notified provider

## 2020-07-13 NOTE — ED Notes (Signed)
Pt reports no change in CP after nitro

## 2020-07-13 NOTE — Consult Note (Addendum)
Cardiology consult note:   Patient ID: Cody [name]; MRN: [id number]; DOB: [birth year]   Admission date: 07/13/2020  Primary Care Provider: Ailene Ravel, MD Primary Cardiologist: Thomasene Ripple, DO  Primary Electrophysiologist: None    Chief Complaint:  Chest Pain  Patient Profile:   Cody [name] is a 59 y.o. male with a history of  CAD w/ RCA stent 2013, LAD stent, HTN, HLD, PAD, Tob use, GERD, cocaine abuse, CP admit 03/2020 w/ non-obs dz at cath.   He came to the emergency room with chest pain, cardiology asked to evaluate him by Dr Silverio Lay.   History of Present Illness:   [name] developed chest pain about 8:00 this morning.  It is in the middle of his chest.  It does not change significantly with position change.  It is a little bit better with deep inspiration.  It is the same pain he was having in June 2021 prior to his last cath.  It feels like someone is sitting on his chest.  He feels short of breath with this.  He has not eaten in over 24 hours so he is very hungry, and he is a little nauseated.  He also states he was having numbness in his legs when he woke up this morning.  He is having numbness in both hands.  He had severe pain in his arm and his hand would not move.  I suggested it was a cramp and he said he had never had anything like this before.  He is having trouble swallowing.  Feels very weak.   Past Medical History:  Diagnosis Date  . Cocaine abuse (HCC) 09/20/2012  . Coronary artery disease   . Dyslipidemia 09/20/2012  . GERD (gastroesophageal reflux disease)    has stomach ulcers a while back  . HTN (hypertension)   . PAD (peripheral artery disease) (HCC) 09/20/2012  . Smoker 09/20/2012    Past Surgical History:  Procedure Laterality Date  . CORONARY STENT PLACEMENT    . INTRAVASCULAR PRESSURE WIRE/FFR STUDY N/A 03/30/2020   Procedure: INTRAVASCULAR PRESSURE WIRE/FFR STUDY;  Surgeon: Kathleene Hazel, MD;  Location: [facility];  Service: Cardiovascular;  Laterality: N/A;  . LEFT HEART CATH AND CORONARY ANGIOGRAPHY N/A 03/30/2020   Procedure: LEFT HEART CATH AND CORONARY ANGIOGRAPHY;  Surgeon: Kathleene Hazel, MD;  Location: [facility];  Service: Cardiovascular;  Laterality: N/A;  . LEFT HEART CATHETERIZATION WITH CORONARY ANGIOGRAM N/A 09/24/2012   Procedure: LEFT HEART CATHETERIZATION WITH CORONARY ANGIOGRAM;  Surgeon: Lesleigh Noe, MD;  Location: [facility] CATH LAB;  Service: Cardiovascular;  Laterality: N/A;     Medications Prior to Admission: Prior to Admission medications   Medication Sig Start Date End Date Taking? Authorizing Provider  amLODipine (NORVASC) 10 MG tablet Take 1 tablet (10 mg total) by mouth daily. 04/01/20   Rodolph Bong, MD  aspirin EC 81 MG tablet Take 1 tablet (81 mg total) by mouth daily. 04/01/20   Rodolph Bong, MD  atorvastatin (LIPITOR) 40 MG tablet Take 1 tablet (40 mg total) by mouth daily. 04/01/20   Rodolph Bong, MD  carvedilol (COREG) 12.5 MG tablet Take 1 tablet (12.5 mg total) by mouth 2 (two) times daily. 04/01/20   Rodolph Bong, MD  cloNIDine (CATAPRES) 0.1 MG tablet Take 1 tablet (0.1 mg total) by mouth 3 (three) times daily. 04/01/20   Rodolph Bong, MD  ezetimibe (ZETIA) 10 MG tablet Take 1 tablet (10 mg  total) by mouth daily. 04/11/20 07/10/20  Tobb, Kardie, DO  fluticasone (FLONASE) 50 MCG/ACT nasal spray Place 1 spray into both nostrils daily as needed for allergies or rhinitis. 04/01/20   Rodolph Bong, MD  Multiple Vitamins-Minerals (MULTIVITAMIN WITH MINERALS) tablet Take 1 tablet by mouth daily.    [provider]  nitroGLYCERIN (NITROSTAT) 0.4 MG SL tablet Place 1 tablet (0.4 mg total) under the tongue every 5 (five) minutes as needed for chest pain. 04/01/20   Rodolph Bong, MD  pantoprazole (PROTONIX) 40 MG tablet Take 1 tablet (40 mg total) by mouth daily. 04/01/20   Rodolph Bong, MD  ranolazine (RANEXA) 500  MG 12 hr tablet Take 500 mg by mouth 2 (two) times daily.    [provider]  simethicone (MYLICON) 80 MG chewable tablet Chew 2 tablets (160 mg total) by mouth 3 (three) times daily as needed for flatulence. 04/01/20   Rodolph Bong, MD     Allergies:    Allergies  Allergen Reactions  . Contrast Media [Iodinated Diagnostic Agents] Nausea And Vomiting    Vomiting blood  . Peanuts [Peanut Oil] Hives  . Plavix [Clopidogrel] Other (See Comments)    Damaged left eye    Social History:   Social History   Socioeconomic History  . Marital status: Married    Spouse name: Not on file  . Number of children: Not on file  . Years of education: Not on file  . Highest education level: Not on file  Occupational History  . Not on file  Tobacco Use  . Smoking status: Current Every Day Smoker    Types: Cigarettes  . Smokeless tobacco: Never Used  Vaping Use  . Vaping Use: Never assessed  Substance and Sexual Activity  . Alcohol use: Yes    Alcohol/week: 3.0 - 4.0 standard drinks    Types: 2 Cans of beer, 1 - 2 Shots of liquor per week  . Drug use: Yes    Types: Cocaine  . Sexual activity: Not on file  Other Topics Concern  . Not on file  Social History Narrative  . Not on file   Social Determinants of Health   Financial Resource Strain:   . Difficulty of Paying Living Expenses: Not on file  Food Insecurity:   . Worried About Programme researcher, broadcasting/film/video in the Last Year: Not on file  . Ran Out of Food in the Last Year: Not on file  Transportation Needs:   . Lack of Transportation (Medical): Not on file  . Lack of Transportation (Non-Medical): Not on file  Physical Activity:   . Days of Exercise per Week: Not on file  . Minutes of Exercise per Session: Not on file  Stress:   . Feeling of Stress : Not on file  Social Connections:   . Frequency of Communication with Friends and Family: Not on file  . Frequency of Social Gatherings with Friends and Family: Not on file  .  Attends Religious Services: Not on file  . Active Member of Clubs or Organizations: Not on file  . Attends Banker Meetings: Not on file  . Marital Status: Not on file  Intimate Partner Violence:   . Fear of Current or Ex-Partner: Not on file  . Emotionally Abused: Not on file  . Physically Abused: Not on file  . Sexually Abused: Not on file    Family History:  The patient's family history includes CAD in his brother; Diabetes in  his father; Hypertension in his mother.   The patient He indicated that the status of his mother is unknown. He indicated that the status of his father is unknown. He indicated that the status of his brother is unknown.   ROS:  Please see the history of present illness.  All other ROS reviewed and negative.     Physical Exam/Data:   Vitals:   07/13/20 1330 07/13/20 1430 07/13/20 1500 07/13/20 1530  BP: (!) 142/88 (!) 148/86 (!) 163/92 (!) 162/96  Pulse: 64 65 62 63  Resp: (!) 31 18 (!) 29 (!) 22  Temp:      TempSrc:      SpO2: 99% 99% 98% 98%  Weight:      Height:       No intake or output data in the 24 hours ending 07/13/20 1625 Filed Weights   07/13/20 1151  Weight: 78.8 kg   Body mass index is 28.04 kg/m.  General:  Well nourished, well developed, male in acute distress HEENT: normal Lymph: no adenopathy Neck:  JVD not elevated Endocrine:  No thryomegaly Vascular: No carotid bruits; 4/4 extremity pulses 2+ bilaterally  Cardiac:  normal S1, S2; RRR; no murmur, no rub or gallop  Lungs:  clear to auscultation bilaterally, no wheezing, rhonchi or rales  Abd: soft, nontender, no hepatomegaly  Ext: no edema Musculoskeletal:  No deformities, BUE and BLE strength normal and equal Skin: warm and dry  Neuro:  CNs 2-12 intact, no focal abnormalities noted Psych:  Normal affect    EKG:  The ECG was personally reviewed: SR, HR 60, no acute ischemic changes, minor changes from 03/31/2020 Telemetry: SR, occ PVCs  Relevant CV  Studies:  ECHO: 12/29/2019 1. Left ventricular ejection fraction, by estimation, is 55 to 60%. The  left ventricle has normal function. The left ventricle has no regional  wall motion abnormalities. There is moderate concentric left ventricular  hypertrophy. Left ventricular  diastolic parameters are consistent with Grade I diastolic dysfunction  (impaired relaxation).  2. Right ventricular systolic function is normal. The right ventricular  size is normal.  3. The mitral valve is normal in structure. No evidence of mitral valve  regurgitation. No evidence of mitral stenosis.  4. The aortic valve is normal in structure. Aortic valve regurgitation is  not visualized. No aortic stenosis is present.  5. The inferior vena cava is normal in size with greater than 50%  respiratory variability, suggesting right atrial pressure of 3 mmHg.    Cardiac catheterization 03/30/2020:  Prox RCA-1 lesion is 30% stenosed.  Prox RCA-2 lesion is 10% stenosed.  Prox RCA to Mid RCA lesion is 30% stenosed.  Mid RCA lesion is 10% stenosed.  Dist RCA lesion is 30% stenosed.  RPAV lesion is 30% stenosed.  Mid Cx lesion is 60% stenosed.  Mid LAD-1 lesion is 50% stenosed.  Previously placed Mid LAD-2 stent (unknown type) is widely patent.  1. Patent stent mid LAD without restenosis. Moderate mid LAD stenosis prior to the old stent, unchanged from last cath.  2. Moderate mid Circumflex stenosis. DFR of this lesion was 0.99 suggesting the stenosis is not significant 3. The RCA has overlapping mid stents and a distal stent, all are patent without restenosis.   Recommendations: Continue medical management of CAD  Laboratory Data:  Chemistry Recent Labs  Lab 07/13/20 1359  NA 141  K 3.1*  CL 104  CO2 25  GLUCOSE 86  BUN 13  CREATININE 1.07  CALCIUM 9.2  GFRNONAA >60  ANIONGAP 12    No results for input(s): PROT, ALBUMIN, AST, ALT, ALKPHOS, BILITOT in the last 168  hours. Hematology Recent Labs  Lab 07/13/20 1359  WBC 9.8  RBC 4.35  HGB 12.7*  HCT 38.6*  MCV 88.7  MCH 29.2  MCHC 32.9  RDW 12.7  PLT 270   Cardiac Enzymes  High Sensitivity Troponin:  Recent Labs  Lab 07/13/20 1359 07/13/20 1550  TROPONINIHS 26* 23*      High Sensitivity Troponin:   Recent Labs  Lab 07/13/20 1359  TROPONINIHS 26*     BNPNo results for input(s): BNP, PROBNP in the last 168 hours.  DDimer No results for input(s): DDIMER in the last 168 hours. Lipids:  Lab Results  Component Value Date   CHOL 164 03/30/2020   HDL 46 03/30/2020   LDLCALC 112 (H) 03/30/2020   TRIG 31 03/30/2020   CHOLHDL 3.6 03/30/2020   INR:  Lab Results  Component Value Date   INR 1.09 09/20/2012   A1c: No results found for: HGBA1C Thyroid:  Lab Results  Component Value Date   TSH 1.484 09/20/2012   Drugs of Abuse     Component Value Date/Time   LABOPIA NONE DETECTED 10/02/2019 1144   COCAINSCRNUR POSITIVE (A) 10/02/2019 1144   LABBENZ NONE DETECTED 10/02/2019 1144   AMPHETMU NONE DETECTED 10/02/2019 1144   THCU NONE DETECTED 10/02/2019 1144   LABBARB NONE DETECTED 10/02/2019 1144     Radiology/Studies:  DG Chest 2 View  Result Date: 07/13/2020 CLINICAL DATA:  Chest pain. EXAM: CHEST - 2 VIEW COMPARISON:  March 31, 2020. FINDINGS: The heart size and mediastinal contours are within normal limits. Both lungs are clear. No pneumothorax or pleural effusion is noted. The visualized skeletal structures are unremarkable. IMPRESSION: No active cardiopulmonary disease. Electronically Signed   By: Lupita Raider M.D.   On: 07/13/2020 12:53    Assessment and Plan:   1. Chest pain - mild trop elevation w/ no sig trend - no ST elevation despite > 8 hr of pain - no chest wall tenderness, no change w/ position, slightly better w/ deep inspiration - recent cath w/ non-obs dz - need repeat trop, if no sig change, MD advise on further eval - mentioned tension to him, he  agrees the chest pain is making him tense. Will order a 1-time dose of Ativan - as most of his symptoms are not heart-related, will defer admission to IM.   For questions or updates, please contact [facility] Please consult [email or web address] for contact info under Cardiology/STEMI.  Personally seen and examined. Agree with above.   59 year old with chest pain since 8 AM this morning substernal may be slightly better with deep inspiration with occasional twinges of sharp right sided neck pain, right hand that became acutely painful and frozen a grip position, as well as bilateral leg weakness with difficulty standing/numbness in his legs, numbness in his hands.    Just had a cardiac catheterization in June which showed patent stents  Troponin minimally elevated 23, 25.  EKG with T wave inversion personally reviewed in V3 through V6 as well as inferior leads, essentially unchanged from prior.  On exam-anxious, regular rate and rhythm, no rubs no new murmurs, seems to have normal sensation in his legs and hands as well as strength.  Assessment and plan:  Chest discomfort with known coronary artery disease -Minimally elevated troponin, flat, does not appear to be acute coronary syndrome.  Explained to him  that if his stents were to acutely close, his troponins would be markedly elevated.  He just had a cardiac catheterization done in June.  Reassuring.  He still persisted to tell me that he had chest discomfort and then winced with right-sided neck pain.  Explained to him that at this time, does not look like he needs a heart catheterization.  He was fairly persistent, told me the story how he had a normal stress test before and told his prior cardiologist that he needed a heart catheterization and then ended up with 2 stents.  I then once again reassured him that those 2 stents were evaluated as well as the rest of his heart and they were widely patent just a few months ago.  He then was worried  about some of the small vessels that may have had blockages and why they were not stented.  Explained to him the rationale behind that. -Check an echocardiogram to ensure that he does not have a pericardial effusion or evidence of pericarditis. -He is currently getting Ativan for anxiety and muscle relaxation. -I do not anticipate that he will require cardiac catheterization.  It may be challenging for us to convince him of that.  Continue to treat blood pressure.  Discussed with ER team. We will follow along.  Donato SchultzMark Donovon Micheletti, MD

## 2020-07-13 NOTE — H&P (Addendum)
See consult note   Error  Donato Schultz, MD

## 2020-07-13 NOTE — ED Notes (Signed)
Pt received meal tray. 

## 2020-07-13 NOTE — Progress Notes (Signed)
Patient transferred from ED at 1842hrs.  Oriented to unit and plan of care for shift. Patient verbalized understanding.   Ordered dinner tray.

## 2020-07-13 NOTE — ED Notes (Signed)
Pt refused to let phlebotomy draw labs

## 2020-07-14 ENCOUNTER — Observation Stay (HOSPITAL_BASED_OUTPATIENT_CLINIC_OR_DEPARTMENT_OTHER): Payer: Medicare Other

## 2020-07-14 DIAGNOSIS — K279 Peptic ulcer, site unspecified, unspecified as acute or chronic, without hemorrhage or perforation: Secondary | ICD-10-CM | POA: Diagnosis present

## 2020-07-14 DIAGNOSIS — R262 Difficulty in walking, not elsewhere classified: Secondary | ICD-10-CM | POA: Diagnosis not present

## 2020-07-14 DIAGNOSIS — I2510 Atherosclerotic heart disease of native coronary artery without angina pectoris: Secondary | ICD-10-CM | POA: Diagnosis not present

## 2020-07-14 DIAGNOSIS — I25118 Atherosclerotic heart disease of native coronary artery with other forms of angina pectoris: Secondary | ICD-10-CM | POA: Diagnosis not present

## 2020-07-14 DIAGNOSIS — G629 Polyneuropathy, unspecified: Secondary | ICD-10-CM | POA: Diagnosis present

## 2020-07-14 DIAGNOSIS — F17210 Nicotine dependence, cigarettes, uncomplicated: Secondary | ICD-10-CM | POA: Diagnosis present

## 2020-07-14 DIAGNOSIS — Z955 Presence of coronary angioplasty implant and graft: Secondary | ICD-10-CM | POA: Diagnosis not present

## 2020-07-14 DIAGNOSIS — H5712 Ocular pain, left eye: Secondary | ICD-10-CM | POA: Diagnosis present

## 2020-07-14 DIAGNOSIS — Z20822 Contact with and (suspected) exposure to covid-19: Secondary | ICD-10-CM | POA: Diagnosis present

## 2020-07-14 DIAGNOSIS — R079 Chest pain, unspecified: Secondary | ICD-10-CM | POA: Diagnosis present

## 2020-07-14 DIAGNOSIS — K2970 Gastritis, unspecified, without bleeding: Secondary | ICD-10-CM | POA: Diagnosis not present

## 2020-07-14 DIAGNOSIS — Z23 Encounter for immunization: Secondary | ICD-10-CM | POA: Diagnosis present

## 2020-07-14 DIAGNOSIS — Z8249 Family history of ischemic heart disease and other diseases of the circulatory system: Secondary | ICD-10-CM | POA: Diagnosis not present

## 2020-07-14 DIAGNOSIS — K219 Gastro-esophageal reflux disease without esophagitis: Secondary | ICD-10-CM | POA: Diagnosis present

## 2020-07-14 DIAGNOSIS — Z8719 Personal history of other diseases of the digestive system: Secondary | ICD-10-CM | POA: Diagnosis not present

## 2020-07-14 DIAGNOSIS — Q399 Congenital malformation of esophagus, unspecified: Secondary | ICD-10-CM | POA: Diagnosis not present

## 2020-07-14 DIAGNOSIS — G4700 Insomnia, unspecified: Secondary | ICD-10-CM | POA: Diagnosis present

## 2020-07-14 DIAGNOSIS — I5032 Chronic diastolic (congestive) heart failure: Secondary | ICD-10-CM | POA: Diagnosis present

## 2020-07-14 DIAGNOSIS — I739 Peripheral vascular disease, unspecified: Secondary | ICD-10-CM

## 2020-07-14 DIAGNOSIS — R1013 Epigastric pain: Secondary | ICD-10-CM | POA: Diagnosis not present

## 2020-07-14 DIAGNOSIS — E785 Hyperlipidemia, unspecified: Secondary | ICD-10-CM | POA: Diagnosis present

## 2020-07-14 DIAGNOSIS — B3781 Candidal esophagitis: Secondary | ICD-10-CM | POA: Diagnosis present

## 2020-07-14 DIAGNOSIS — I25110 Atherosclerotic heart disease of native coronary artery with unstable angina pectoris: Secondary | ICD-10-CM | POA: Diagnosis present

## 2020-07-14 DIAGNOSIS — K222 Esophageal obstruction: Secondary | ICD-10-CM | POA: Diagnosis not present

## 2020-07-14 DIAGNOSIS — F1410 Cocaine abuse, uncomplicated: Secondary | ICD-10-CM | POA: Diagnosis present

## 2020-07-14 DIAGNOSIS — Z888 Allergy status to other drugs, medicaments and biological substances status: Secondary | ICD-10-CM | POA: Diagnosis not present

## 2020-07-14 DIAGNOSIS — Z833 Family history of diabetes mellitus: Secondary | ICD-10-CM | POA: Diagnosis not present

## 2020-07-14 DIAGNOSIS — Z91010 Allergy to peanuts: Secondary | ICD-10-CM | POA: Diagnosis not present

## 2020-07-14 DIAGNOSIS — H53149 Visual discomfort, unspecified: Secondary | ICD-10-CM | POA: Diagnosis present

## 2020-07-14 DIAGNOSIS — T405X1A Poisoning by cocaine, accidental (unintentional), initial encounter: Secondary | ICD-10-CM | POA: Diagnosis present

## 2020-07-14 DIAGNOSIS — I10 Essential (primary) hypertension: Secondary | ICD-10-CM | POA: Diagnosis not present

## 2020-07-14 DIAGNOSIS — I110 Hypertensive heart disease with heart failure: Secondary | ICD-10-CM | POA: Diagnosis present

## 2020-07-14 DIAGNOSIS — Y929 Unspecified place or not applicable: Secondary | ICD-10-CM | POA: Diagnosis not present

## 2020-07-14 DIAGNOSIS — Z91041 Radiographic dye allergy status: Secondary | ICD-10-CM | POA: Diagnosis not present

## 2020-07-14 LAB — CBC
HCT: 37.9 % — ABNORMAL LOW (ref 39.0–52.0)
Hemoglobin: 12.3 g/dL — ABNORMAL LOW (ref 13.0–17.0)
MCH: 28.9 pg (ref 26.0–34.0)
MCHC: 32.5 g/dL (ref 30.0–36.0)
MCV: 89.2 fL (ref 80.0–100.0)
Platelets: 268 10*3/uL (ref 150–400)
RBC: 4.25 MIL/uL (ref 4.22–5.81)
RDW: 12.8 % (ref 11.5–15.5)
WBC: 8.6 10*3/uL (ref 4.0–10.5)
nRBC: 0 % (ref 0.0–0.2)

## 2020-07-14 LAB — ECHOCARDIOGRAM LIMITED
Area-P 1/2: 3.76 cm2
Height: 66 in
S' Lateral: 3.6 cm
Weight: 2724.8 oz

## 2020-07-14 LAB — BASIC METABOLIC PANEL
Anion gap: 14 (ref 5–15)
BUN: 18 mg/dL (ref 6–20)
CO2: 24 mmol/L (ref 22–32)
Calcium: 8.9 mg/dL (ref 8.9–10.3)
Chloride: 102 mmol/L (ref 98–111)
Creatinine, Ser: 1.08 mg/dL (ref 0.61–1.24)
GFR, Estimated: >60 mL/min (ref >60)
Glucose, Bld: 72 mg/dL (ref 70–99)
Potassium: 3.9 mmol/L (ref 3.5–5.1)
Sodium: 140 mmol/L (ref 135–145)

## 2020-07-14 LAB — MAGNESIUM: Magnesium: 2.6 mg/dL — ABNORMAL HIGH (ref 1.7–2.4)

## 2020-07-14 MED ORDER — ACETAMINOPHEN 325 MG PO TABS
650.0000 mg | ORAL_TABLET | Freq: Four times a day (QID) | ORAL | Status: DC
  Administered 2020-07-14 – 2020-07-20 (×23): 650 mg via ORAL
  Filled 2020-07-14 (×23): qty 2

## 2020-07-14 MED ORDER — SUCRALFATE 1 GM/10ML PO SUSP
1.0000 g | Freq: Three times a day (TID) | ORAL | Status: DC
  Administered 2020-07-14 – 2020-07-20 (×19): 1 g via ORAL
  Filled 2020-07-14 (×20): qty 10

## 2020-07-14 MED ORDER — PANTOPRAZOLE SODIUM 40 MG IV SOLR
40.0000 mg | Freq: Two times a day (BID) | INTRAVENOUS | Status: DC
  Administered 2020-07-14 – 2020-07-18 (×8): 40 mg via INTRAVENOUS
  Filled 2020-07-14 (×9): qty 40

## 2020-07-14 NOTE — Progress Notes (Signed)
Progress Note  Patient Name: Cody [name] Date of Encounter: 07/14/2020  [facility] Cardiologist: Thomasene Ripple, DO    Subjective   59 year old gentleman with a history of coronary artery disease, status post stenting of the RCA in 2013, status post stenting of the LAD, hypertension, hyperlipidemia, peripheral arterial disease, cigarette smoking, cocaine abuse was admitted with chest pain.  Heart catheterization in June, 2021 revealed nonobstructive coronary artery disease. He complains of trouble swallowing. Labs reveal mild troponin elevation with no significant upward trend.  He has no acute ST or T wave changes.  ECG this morning is without acute changes.  He is convinced that he needs a heart catheterization.  He states that his symptoms that brought him in for this admission are exactly the same as his previous episodes of chest pain prior to his stenting. He has a moderate stenosis in his LCx .   FFR eval was 0.99 suggesting no significant obstruction    Inpatient Medications    Scheduled Meds: . amLODipine  10 mg Oral Daily  . aspirin  324 mg Oral Once  . aspirin EC  81 mg Oral Daily  . atorvastatin  40 mg Oral Daily  . carvedilol  12.5 mg Oral BID  . cloNIDine  0.1 mg Oral TID  . enoxaparin (LOVENOX) injection  40 mg Subcutaneous Q24H  . ezetimibe  10 mg Oral QHS  . gabapentin  300 mg Oral BID  . pantoprazole  40 mg Oral Daily  . potassium chloride  40 mEq Oral BID  . ranolazine  500 mg Oral BID   Continuous Infusions:  PRN Meds: hydrALAZINE, nitroGLYCERIN   Vital Signs    Vitals:   07/13/20 1842 07/13/20 2112 07/13/20 2342 07/14/20 0702  BP: (!) 154/84 (!) 146/85 130/69 135/87  Pulse: 72 64 69 70  Resp: 20 19 16    Temp: 99.1 F (37.3 C) 99.1 F (37.3 C) 98.8 F (37.1 C) 98 F (36.7 C)  TempSrc: Oral Oral Oral Oral  SpO2: 99% 90% 93% 97%  Weight:    77.2 kg  Height:        Intake/Output Summary (Last 24 hours) at 07/14/2020 0826 Last data  filed at 07/13/2020 2245 Gross per 24 hour  Intake 120 ml  Output --  Net 120 ml   Last 3 Weights 07/14/2020 07/13/2020 04/11/2020  Weight (lbs) 170 lb 4.8 oz 173 lb 11.6 oz 173 lb 9.6 oz  Weight (kg) 77.248 kg 78.8 kg 78.744 kg      Telemetry    nsr  - Personally Reviewed  ECG    nsr , no St changes - Personally Reviewed  Physical Exam   GEN: No acute distress.   Neck: No JVD Cardiac: RRR, no murmurs, rubs, or gallops.  Respiratory: Clear to auscultation bilaterally. GI: Soft, nontender, non-distended  MS: No edema; No deformity. Neuro:  Nonfocal  Psych: Normal affect   Labs    High Sensitivity Troponin:   Recent Labs  Lab 07/13/20 1359 07/13/20 1550 07/13/20 2134  TROPONINIHS 26* 23* 21*      Chemistry Recent Labs  Lab 07/13/20 1359  NA 141  K 3.1*  CL 104  CO2 25  GLUCOSE 86  BUN 13  CREATININE 1.07  CALCIUM 9.2  GFRNONAA >60  ANIONGAP 12     Hematology Recent Labs  Lab 07/13/20 1359  WBC 9.8  RBC 4.35  HGB 12.7*  HCT 38.6*  MCV 88.7  MCH 29.2  MCHC 32.9  RDW  12.7  PLT 270    BNPNo results for input(s): BNP, PROBNP in the last 168 hours.   DDimer No results for input(s): DDIMER in the last 168 hours.   Radiology    DG Chest 2 View  Result Date: 07/13/2020 CLINICAL DATA:  Chest pain. EXAM: CHEST - 2 VIEW COMPARISON:  March 31, 2020. FINDINGS: The heart size and mediastinal contours are within normal limits. Both lungs are clear. No pneumothorax or pleural effusion is noted. The visualized skeletal structures are unremarkable. IMPRESSION: No active cardiopulmonary disease. Electronically Signed   By: Lupita Raider M.D.   On: 07/13/2020 12:53    Cardiac Studies      Patient Profile     59 y.o. male with CAd.   Admitted with cp.   Assessment & Plan    1.  Chest discomfort: Patient was admitted with chest pain for 8 hours.  His troponin levels were minimally elevated but the trend was not consistent with ACS.  His troponins have  actually decreased through the admission despite having continued chest pain.  He does state that the pain is exactly like his pain prior to his stenting.  He is convinced that he needs a heart catheterization.  We will get an echocardiogram for further evaluation to make sure he does not have pericarditis/pericardial effusion.  Continue to follow and continue to look for other noncardiac causes of his chest pain.    2.  Hx of cocaine abuse.   Will check UDS    For questions or updates, please contact [facility] Please consult [email or web address] for contact info under        Signed, Kristeen Miss, MD  07/14/2020, 8:26 AM

## 2020-07-14 NOTE — Progress Notes (Addendum)
PROGRESS NOTE    Cody [name]  JJK:[id number] DOB: Mar 04, 1961 DOA: 07/13/2020 PCP: Cody Ravel, MD    Brief Narrative:  Cody [name] was admitted with a working diagnosis of chest pain, to rule out acute coronary syndrome.  59 year old male with past medical history for cocaine abuse, coronary disease status post angioplasty, 2013/2021, hypertension, dyslipidemia and peripheral vascular disease.  Patient reported precordial chest pain, intermittent for about 2 weeks, pain was associated with nausea and paresthesias lower extremities and hands.  On his initial physical examination blood pressure 165/96, heart rate 61, temperature 99.1, respiratory 21, oxygen saturation 99%.  His lungs were clear to auscultation bilaterally, heart S1-S2, present rhythm, soft abdomen, no lower extremity edema. Sodium 141, potassium 3.1, chloride 104, bicarb 25, glucose 86, BUN 13, creatinine 1.0, troponin I 26-23-21, white count 9.8, hemoglobin 12.7, hematocrit 38.6, platelets 270.  SARS COVID-19 negative.  Chest radiograph no infiltrates. EKG 60 bpm, normal axis, normal intervals, sinus rhythm, no ST segment changes, negative T waves lead II, III and aVF.  Assessment & Plan:   Principal Problem:   Chest pain Active Problems:   Coronary artery disease   HTN (hypertension)   PAD (peripheral artery disease) (HCC)   Cocaine abuse (HCC)   PUD (peptic ulcer disease)   1.  Chest pain/ CAD/ dyslipidemia. Patient continue to have chest pain, precordial and pressure in nature, no improving or worsening factors.  Troponin mild elevation not pattern for ACS. Echocardiogram with no wall motion abnormalities and no pericardial effusion.  Follow up EKG with normalization of inverted inferior T waves.   Continue medical therapy with aspirin and statin therapy with atorvastatin/ ezetimibe. On Ranolazine.   Add scheduled acetaminophen.   2. Peptic ulcer disease. Change pantoprazole to IV BID and will add  sucralfate for possible gastroesophageal source of pain.  No nausea or vomiting. Patient had upper GI ulcers in the past.   3. HTN. Continue blood pressure control with carvedilol, amlodipine and clonidine.   4. PAD. Follow up with urine drug screen.      Patient continue to be at high risk for persistent chest pain.   Status is: Observation  The patient will require care spanning > 2 midnights and should be moved to inpatient because: Ongoing active pain requiring inpatient pain management  Dispo:  Patient From: Home  Planned Disposition: Home  Expected discharge date: 07/15/20  Medically stable for discharge: No   DVT prophylaxis:  enoxaparin  Code Status:   full  Family Communication:  No family at the bedside       Consultants:   Cardiology     Subjective: Patient continue to have persistent chest pain, no nausea or vomiting, pain is 6.10 in intensity, pressure in nature with no improving or worsening factors.   Objective: Vitals:   07/13/20 2112 07/13/20 2342 07/14/20 0702 07/14/20 0900  BP: (!) 146/85 130/69 135/87   Pulse: 64 69 70 63  Resp: 19 16    Temp: 99.1 F (37.3 C) 98.8 F (37.1 C) 98 F (36.7 C)   TempSrc: Oral Oral Oral   SpO2: 90% 93% 97% 99%  Weight:   77.2 kg   Height:        Intake/Output Summary (Last 24 hours) at 07/14/2020 1519 Last data filed at 07/14/2020 0800 Gross per 24 hour  Intake 360 ml  Output --  Net 360 ml   Filed Weights   07/13/20 1151 07/14/20 0702  Weight: 78.8 kg 77.2 kg  Examination:   General: Not in pain or dyspnea  Neurology: Awake and alert, non focal  E ENT: no pallor, no icterus, oral mucosa moist Cardiovascular: No JVD. S1-S2 present, rhythmic, no gallops, rubs, or murmurs. No lower extremity edema. Pulmonary:  Positive breath sounds bilaterally, adequate air movement, no wheezing, rhonchi or rales. Gastrointestinal. Abdomen soft and non tender Skin. No rashes Musculoskeletal: no joint  deformities     Data Reviewed: I have personally reviewed following labs and imaging studies  CBC: Recent Labs  Lab 07/13/20 1359 07/14/20 0209  WBC 9.8 8.6  NEUTROABS 4.7  --   HGB 12.7* 12.3*  HCT 38.6* 37.9*  MCV 88.7 89.2  PLT 270 268   Basic Metabolic Panel: Recent Labs  Lab 07/13/20 1359 07/14/20 0209  NA 141 140  K 3.1* 3.9  CL 104 102  CO2 25 24  GLUCOSE 86 72  BUN 13 18  CREATININE 1.07 1.08  CALCIUM 9.2 8.9  MG  --  2.6*   GFR: Estimated Creatinine Clearance: 73.9 mL/min (by C-G formula based on SCr of 1.08 mg/dL). Liver Function Tests: No results for input(s): AST, ALT, ALKPHOS, BILITOT, PROT, ALBUMIN in the last 168 hours. No results for input(s): LIPASE, AMYLASE in the last 168 hours. No results for input(s): AMMONIA in the last 168 hours. Coagulation Profile: No results for input(s): INR, PROTIME in the last 168 hours. Cardiac Enzymes: No results for input(s): CKTOTAL, CKMB, CKMBINDEX, TROPONINI in the last 168 hours. BNP (last 3 results) No results for input(s): PROBNP in the last 8760 hours. HbA1C: No results for input(s): HGBA1C in the last 72 hours. CBG: No results for input(s): GLUCAP in the last 168 hours. Lipid Profile: No results for input(s): CHOL, HDL, LDLCALC, TRIG, CHOLHDL, LDLDIRECT in the last 72 hours. Thyroid Function Tests: No results for input(s): TSH, T4TOTAL, FREET4, T3FREE, THYROIDAB in the last 72 hours. Anemia Panel: No results for input(s): VITAMINB12, FOLATE, FERRITIN, TIBC, IRON, RETICCTPCT in the last 72 hours.    Radiology Studies: I have reviewed all of the imaging during this hospital visit personally     Scheduled Meds: . amLODipine  10 mg Oral Daily  . aspirin  324 mg Oral Once  . aspirin EC  81 mg Oral Daily  . atorvastatin  40 mg Oral Daily  . carvedilol  12.5 mg Oral BID  . cloNIDine  0.1 mg Oral TID  . enoxaparin (LOVENOX) injection  40 mg Subcutaneous Q24H  . ezetimibe  10 mg Oral QHS  .  gabapentin  300 mg Oral BID  . pantoprazole  40 mg Oral Daily  . ranolazine  500 mg Oral BID   Continuous Infusions:   LOS: 0 days        Cody Branan Annett Gula, MD

## 2020-07-14 NOTE — Progress Notes (Signed)
Echocardiogram 2D Echocardiogram limited with 3D has been performed.  Leta Jungling M 07/14/2020, 9:33 AM

## 2020-07-14 NOTE — Care Management Obs Status (Signed)
MEDICARE OBSERVATION STATUS NOTIFICATION   Patient Details  Name: Xayvion Shirah MRN: [id number] Date of Birth: [birth year]   Medicare Observation Status Notification Given:  Yes    Lawerance Sabal, RN 07/14/2020, 3:53 PM

## 2020-07-15 ENCOUNTER — Encounter (HOSPITAL_COMMUNITY): Payer: Self-pay | Admitting: Internal Medicine

## 2020-07-15 DIAGNOSIS — F1410 Cocaine abuse, uncomplicated: Secondary | ICD-10-CM | POA: Diagnosis not present

## 2020-07-15 DIAGNOSIS — I10 Essential (primary) hypertension: Secondary | ICD-10-CM | POA: Diagnosis not present

## 2020-07-15 DIAGNOSIS — I2510 Atherosclerotic heart disease of native coronary artery without angina pectoris: Secondary | ICD-10-CM | POA: Diagnosis not present

## 2020-07-15 DIAGNOSIS — R079 Chest pain, unspecified: Secondary | ICD-10-CM | POA: Diagnosis not present

## 2020-07-15 LAB — RAPID URINE DRUG SCREEN, HOSP PERFORMED
Amphetamines: NOT DETECTED
Barbiturates: NOT DETECTED
Benzodiazepines: NOT DETECTED
Cocaine: POSITIVE — AB
Opiates: NOT DETECTED
Tetrahydrocannabinol: NOT DETECTED

## 2020-07-15 MED ORDER — SODIUM CHLORIDE 0.9% FLUSH: 3.0000 mL | INTRAVENOUS | Status: DC | PRN

## 2020-07-15 MED ORDER — PNEUMOCOCCAL VAC POLYVALENT 25 MCG/0.5ML IJ INJ
0.5000 mL | INJECTION | INTRAMUSCULAR | Status: AC
  Administered 2020-07-17: 0.5 mL via INTRAMUSCULAR
  Filled 2020-07-15: qty 0.5

## 2020-07-15 MED ORDER — SODIUM CHLORIDE 0.9% FLUSH
3.0000 mL | Freq: Two times a day (BID) | INTRAVENOUS | Status: DC
  Administered 2020-07-15 – 2020-07-17 (×4): 3 mL via INTRAVENOUS

## 2020-07-15 MED ORDER — SODIUM CHLORIDE 0.9 % WEIGHT BASED INFUSION
1.0000 mL/kg/h | INTRAVENOUS | Status: DC
  Administered 2020-07-16 – 2020-07-17 (×2): 1 mL/kg/h via INTRAVENOUS

## 2020-07-15 MED ORDER — ASPIRIN 81 MG PO CHEW
81.0000 mg | CHEWABLE_TABLET | ORAL | Status: AC
  Administered 2020-07-16: 81 mg via ORAL
  Filled 2020-07-15: qty 1

## 2020-07-15 MED ORDER — TRAZODONE HCL 50 MG PO TABS
50.0000 mg | ORAL_TABLET | Freq: Every day | ORAL | Status: DC
  Administered 2020-07-15 – 2020-07-19 (×5): 50 mg via ORAL
  Filled 2020-07-15 (×5): qty 1

## 2020-07-15 MED ORDER — SODIUM CHLORIDE 0.9 % IV SOLN: 250.0000 mL | INTRAVENOUS | Status: DC | PRN

## 2020-07-15 MED ORDER — ASPIRIN EC 81 MG PO TBEC
81.0000 mg | DELAYED_RELEASE_TABLET | Freq: Every day | ORAL | Status: DC
  Administered 2020-07-17 – 2020-07-20 (×4): 81 mg via ORAL
  Filled 2020-07-15 (×5): qty 1

## 2020-07-15 MED ORDER — SODIUM CHLORIDE 0.9 % WEIGHT BASED INFUSION
3.0000 mL/kg/h | INTRAVENOUS | Status: AC
  Administered 2020-07-16: 3 mL/kg/h via INTRAVENOUS

## 2020-07-15 NOTE — Progress Notes (Addendum)
PROGRESS NOTE    Cody [name]  MEQ:[id number] DOB: 07/11/1961 DOA: 07/13/2020 PCP: Cody Ravel, MD    Brief Narrative:  Cody [name] was admitted with a working diagnosis of chest pain, which has ruled out for ute coronary syndrome. Persistent chest pain.   59 year old male with past medical history for cocaine abuse, coronary disease status post angioplasty (RCA and LAD) in 2013, and non obstructive coronary disease in 2021, hypertension, dyslipidemia and peripheral vascular disease.  Patient reported precordial chest pain, intermittent for about 2 weeks, pain was associated with nausea and paresthesias lower extremities and hands.  On his initial physical examination blood pressure 165/96, heart rate 61, temperature 99.1, respiratory 21, oxygen saturation 99%.  His lungs were clear to auscultation bilaterally, heart S1-S2, present rhythm, soft abdomen, no lower extremity edema. Sodium 141, potassium 3.1, chloride 104, bicarb 25, glucose 86, BUN 13, creatinine 1.0, troponin I 26-23-21, white count 9.8, hemoglobin 12.7, hematocrit 38.6, platelets 270.  SARS COVID-19 negative.  Chest radiograph no infiltrates. EKG 60 bpm, normal axis, normal intervals, sinus rhythm, no ST segment changes, negative T waves lead II, III and aVF.  Patient has ruled out for acute coronary syndrome, but continue to complain of chest pain and dyspepsia.  Placed on antiacid therapy regimen.   Assessment & Plan:   Principal Problem:   Chest pain Active Problems:   Coronary artery disease   HTN (hypertension)   PAD (peripheral artery disease) (HCC)   Cocaine abuse (HCC)   PUD (peptic ulcer disease)    1.  Chest pain/ CAD/ dyslipidemia.   Troponin mild elevation not pattern for ACS. Echocardiogram with no wall motion abnormalities and no pericardial effusion. Follow up EKG with normalization of inverted inferior T waves.  Coronary angiography this year with non obstructive coronary disease.   Patient  with no frank angina, symptoms seem to be more gastrointestinal in origin, he mentions possible cocaine consumption as outpatient recently. Follow on UDS.   Continue with aspirin, atorvastatin/ ezetimibe. Ptaient on Ranolazine.   Add scheduled acetaminophen.   2. Peptic ulcer disease. Continue with pantoprazole to IV BID and sucralfate for antiacid therapy. Patient with no melena or hematochezia. His Hgb is stable at 12,3 with Hct at 268.   Complains of difficulty swallowing, will consult speech therapy.   3. HTN.  On carvedilol, amlodipine and clonidine for blood pressure control.   4. PAD. Follow up with urine drug screen. Patient mentions possible recent cocaine exposure  5. Insomnia. Will add trazodone tonight,   Status is: Inpatient  Remains inpatient appropriate because:Ongoing active pain requiring inpatient pain management   Dispo:  Patient From: Home  Planned Disposition: Home  Expected discharge date: 07/16/20  Medically stable for discharge: No  Follow on response to antiacid therapy, for possible dc home in am, plan for further outpatient workup. He has multiple somatic complains.      DVT prophylaxis: Enoxaparin   Code Status:   full  Family Communication:  No family at the bedside      Consultants:   Cardiology    Subjective: Patient with multiple complains: chest pressure, chest pain, dyspepsia, swallow dysfunction, left hand paresthesias and left leg pain. Requesting invasive testing cardiac catheterization, CT scans and endoscopies.   Objective: Vitals:   07/14/20 2154 07/14/20 2155 07/15/20 0500 07/15/20 0759  BP:  132/84 123/83 132/82  Pulse: 72  60 (!) 59  Resp:   16 12  Temp:   98.2 F (36.8 C) 98.3 F (36.8  C)  TempSrc:   Oral Oral  SpO2:   96% 99%  Weight:   79.6 kg   Height:        Intake/Output Summary (Last 24 hours) at 07/15/2020 0947 Last data filed at 07/15/2020 1540 Gross per 24 hour  Intake 360 ml  Output --  Net  360 ml   Filed Weights   07/13/20 1151 07/14/20 0702 07/15/20 0500  Weight: 78.8 kg 77.2 kg 79.6 kg    Examination:   General: Not in pain or dyspnea  Neurology: Awake and alert, non focal  E ENT: no pallor, no icterus, oral mucosa moist Cardiovascular: No JVD. S1-S2 present, rhythmic, no gallops, rubs, or murmurs. No lower extremity edema. Pulmonary: vesicular breath sounds bilaterally, adequate air movement, no wheezing, rhonchi or rales. Gastrointestinal. Abdomen soft and non tender Skin. No rashes Musculoskeletal: no joint deformities     Data Reviewed: I have personally reviewed following labs and imaging studies  CBC: Recent Labs  Lab 07/13/20 1359 07/14/20 0209  WBC 9.8 8.6  NEUTROABS 4.7  --   HGB 12.7* 12.3*  HCT 38.6* 37.9*  MCV 88.7 89.2  PLT 270 268   Basic Metabolic Panel: Recent Labs  Lab 07/13/20 1359 07/14/20 0209  NA 141 140  K 3.1* 3.9  CL 104 102  CO2 25 24  GLUCOSE 86 72  BUN 13 18  CREATININE 1.07 1.08  CALCIUM 9.2 8.9  MG  --  2.6*   GFR: Estimated Creatinine Clearance: 74.8 mL/min (by C-G formula based on SCr of 1.08 mg/dL). Liver Function Tests: No results for input(s): AST, ALT, ALKPHOS, BILITOT, PROT, ALBUMIN in the last 168 hours. No results for input(s): LIPASE, AMYLASE in the last 168 hours. No results for input(s): AMMONIA in the last 168 hours. Coagulation Profile: No results for input(s): INR, PROTIME in the last 168 hours. Cardiac Enzymes: No results for input(s): CKTOTAL, CKMB, CKMBINDEX, TROPONINI in the last 168 hours. BNP (last 3 results) No results for input(s): PROBNP in the last 8760 hours. HbA1C: No results for input(s): HGBA1C in the last 72 hours. CBG: No results for input(s): GLUCAP in the last 168 hours. Lipid Profile: No results for input(s): CHOL, HDL, LDLCALC, TRIG, CHOLHDL, LDLDIRECT in the last 72 hours. Thyroid Function Tests: No results for input(s): TSH, T4TOTAL, FREET4, T3FREE, THYROIDAB in the  last 72 hours. Anemia Panel: No results for input(s): VITAMINB12, FOLATE, FERRITIN, TIBC, IRON, RETICCTPCT in the last 72 hours.    Radiology Studies: I have reviewed all of the imaging during this hospital visit personally     Scheduled Meds: . acetaminophen  650 mg Oral Q6H  . amLODipine  10 mg Oral Daily  . aspirin  324 mg Oral Once  . aspirin EC  81 mg Oral Daily  . atorvastatin  40 mg Oral Daily  . carvedilol  12.5 mg Oral BID  . cloNIDine  0.1 mg Oral TID  . enoxaparin (LOVENOX) injection  40 mg Subcutaneous Q24H  . ezetimibe  10 mg Oral QHS  . gabapentin  300 mg Oral BID  . pantoprazole (PROTONIX) IV  40 mg Intravenous Q12H  . ranolazine  500 mg Oral BID  . sucralfate  1 g Oral TID WC & HS   Continuous Infusions:   LOS: 1 day        Jamarea Selner Annett Gula, MD

## 2020-07-15 NOTE — Evaluation (Signed)
Clinical/Bedside Swallow Evaluation Patient Details  Name: Cody [name] MRN: [id number] Date of Birth: 05/06/1961  Today's Date: 07/15/2020 Time: SLP Start Time (ACUTE ONLY): 1225 SLP Stop Time (ACUTE ONLY): 1255 SLP Time Calculation (min) (ACUTE ONLY): 30 min  Past Medical History:  Past Medical History:  Diagnosis Date  . Cocaine abuse (HCC) 09/20/2012  . Coronary artery disease   . Dyslipidemia 09/20/2012  . GERD (gastroesophageal reflux disease)    has stomach ulcers a while back  . HTN (hypertension)   . PAD (peripheral artery disease) (HCC) 09/20/2012  . Smoker 09/20/2012   Past Surgical History:  Past Surgical History:  Procedure Laterality Date  . CORONARY STENT PLACEMENT    . INTRAVASCULAR PRESSURE WIRE/FFR STUDY N/A 03/30/2020   Procedure: INTRAVASCULAR PRESSURE WIRE/FFR STUDY;  Surgeon: Kathleene Hazel, MD;  Location: [facility] INVASIVE CV LAB;  Service: Cardiovascular;  Laterality: N/A;  . LEFT HEART CATH AND CORONARY ANGIOGRAPHY N/A 03/30/2020   Procedure: LEFT HEART CATH AND CORONARY ANGIOGRAPHY;  Surgeon: Kathleene Hazel, MD;  Location: [facility] INVASIVE CV LAB;  Service: Cardiovascular;  Laterality: N/A;  . LEFT HEART CATHETERIZATION WITH CORONARY ANGIOGRAM N/A 09/24/2012   Procedure: LEFT HEART CATHETERIZATION WITH CORONARY ANGIOGRAM;  Surgeon: Lesleigh Noe, MD;  Location: [facility] CATH LAB;  Service: Cardiovascular;  Laterality: N/A;   HPI:  Patient is a 59 y.o. male with PMH: CAD s/p stenting, of RCA in 2013, s/p stenting of the LAD, HTN, HDL, PAD peptic ulcer disease, tobacco use and cocaine abuse, who was admitted to hospital with c/o chest pain. He also complained of difficulty swallowing. CXR was negative.   Assessment / Plan / Recommendation Clinical Impression  Patient presents with an oropharyngeal swallow that appears [facility] without any overt s/s of aspiration, penetration. Patient did c/o some difficulty with regular solids which was that it felt like was  transiting slower at pharyngeal phase. Patient had c/o of speech difficulties as well that he said were intermittent and consisted of him not able to get words out and stuttering. He denies any prior speech therapy intervention. While he was talking with SLP about his speech deficits, he did exhibit a mild dysfluency of phoneme prolongation, phoneme and word repetition. Other times he was completly fluent. MRI not current and so unable to fully r/o neurological cause of symptoms. Patient states he has a cardiologist but no PCP. SLP is not recommending any f/u testing of speech or swallow function currently, but if patient continued to have symptoms or if they worsened, would recommend f/u with an MD and may benefit from an outpatient Neurology consult.  SLP Visit Diagnosis: Dysphagia, unspecified (R13.10)    Aspiration Risk  No limitations    Diet Recommendation Regular;Thin liquid   Liquid Administration via: Cup;Straw Medication Administration: Whole meds with liquid Supervision: Patient able to self feed Postural Changes: Seated upright at 90 degrees    Other  Recommendations Oral Care Recommendations: Oral care BID   Follow up Recommendations None      Frequency and Duration   N/A         Prognosis  N/A      Swallow Study   General Date of Onset: 07/15/20 HPI: Patient is a 59 y.o. male with PMH: CAD s/p stenting, of RCA in 2013, s/p stenting of the LAD, HTN, HDL, PAD peptic ulcer disease, tobacco use and cocaine abuse, who was admitted to hospital with c/o chest pain. He also complained of difficulty swallowing. CXR was negative. Type of  Study: Bedside Swallow Evaluation Previous Swallow Assessment: None Diet Prior to this Study: Regular;Thin liquids Temperature Spikes Noted: No Respiratory Status: Room air History of Recent Intubation: No Behavior/Cognition: Alert;Cooperative;Pleasant mood Oral Cavity Assessment: Within Functional Limits Oral Care Completed by SLP:  No Oral Cavity - Dentition: Adequate natural dentition Vision: Functional for self-feeding Self-Feeding Abilities: Able to feed self Patient Positioning: Upright in bed Baseline Vocal Quality: Normal Volitional Cough: Strong Volitional Swallow: Able to elicit    Oral/Motor/Sensory Function Overall Oral Motor/Sensory Function: Within functional limits   Ice Chips     Thin Liquid Thin Liquid: Within functional limits    Nectar Thick     Honey Thick     Puree Puree: Within functional limits   Solid     Solid: Within functional limits Other Comments: Patient without overt s/s but did report his swallowing seemed "slow" and that he sometimes will get food hung up in throat      Angela Nevin, MA, CCC-SLP Speech Therapy [facility]

## 2020-07-15 NOTE — Progress Notes (Signed)
Progress Note  Patient Name: Cody [name] Date of Encounter: 07/15/2020  [facility] Cardiologist: Thomasene Ripple, DO    Subjective   59 year old gentleman with a history of coronary artery disease, status post stenting of the RCA in 2013, status post stenting of the LAD, hypertension, hyperlipidemia, peripheral arterial disease, cigarette smoking, cocaine abuse was admitted with chest pain.  Heart catheterization in June, 2021 revealed nonobstructive coronary artery disease. He complains of trouble swallowing. Labs reveal mild troponin elevation with no significant upward trend.  He has no acute ST or T wave changes.   He is convinced that he needs a heart catheterization.  He states that his symptoms that brought him in for this admission are exactly the same as his previous episodes of chest pain prior to his stenting. He has a moderate stenosis in his LCx .   FFR eval was 0.99 suggesting no significant obstruction   Echo from July 14, 2020 reveals normal left ventricular systolic function with an EF of 55 to 60%.  He has moderate concentric LVH.  He has grade 2 diastolic dysfunction.  He is convinced that he has a narrowing and that his symptoms of CP are the same as prior to his stenting .   Inpatient Medications    Scheduled Meds: . acetaminophen  650 mg Oral Q6H  . amLODipine  10 mg Oral Daily  . aspirin EC  81 mg Oral Daily  . atorvastatin  40 mg Oral Daily  . carvedilol  12.5 mg Oral BID  . cloNIDine  0.1 mg Oral TID  . enoxaparin (LOVENOX) injection  40 mg Subcutaneous Q24H  . ezetimibe  10 mg Oral QHS  . gabapentin  300 mg Oral BID  . pantoprazole (PROTONIX) IV  40 mg Intravenous Q12H  . ranolazine  500 mg Oral BID  . sucralfate  1 g Oral TID WC & HS  . traZODone  50 mg Oral QHS   Continuous Infusions:  PRN Meds: nitroGLYCERIN   Vital Signs    Vitals:   07/14/20 2154 07/14/20 2155 07/15/20 0500 07/15/20 0759  BP:  132/84 123/83 132/82  Pulse: 72   60 (!) 59  Resp:   16 12  Temp:   98.2 F (36.8 C) 98.3 F (36.8 C)  TempSrc:   Oral Oral  SpO2:   96% 99%  Weight:   79.6 kg   Height:        Intake/Output Summary (Last 24 hours) at 07/15/2020 1012 Last data filed at 07/15/2020 0625 Gross per 24 hour  Intake 360 ml  Output --  Net 360 ml   Last 3 Weights 07/15/2020 07/14/2020 07/13/2020  Weight (lbs) 175 lb 6.4 oz 170 lb 4.8 oz 173 lb 11.6 oz  Weight (kg) 79.561 kg 77.248 kg 78.8 kg      Telemetry    nsr  - Personally Reviewed  ECG    nsr , no St changes - Personally Reviewed  Physical Exam   GEN: No acute distress.   Neck: No JVD Cardiac: RRR, no murmurs, rubs, or gallops.  Respiratory: Clear to auscultation bilaterally. GI: Soft, nontender, non-distended  MS: No edema; No deformity. Neuro:  Nonfocal  Psych: Normal affect   Labs    High Sensitivity Troponin:   Recent Labs  Lab 07/13/20 1359 07/13/20 1550 07/13/20 2134  TROPONINIHS 26* 23* 21*      Chemistry Recent Labs  Lab 07/13/20 1359 07/14/20 0209  NA 141 140  K 3.1* 3.9  CL 104 102  CO2 25 24  GLUCOSE 86 72  BUN 13 18  CREATININE 1.07 1.08  CALCIUM 9.2 8.9  GFRNONAA >60 >60  ANIONGAP 12 14     Hematology Recent Labs  Lab 07/13/20 1359 07/14/20 0209  WBC 9.8 8.6  RBC 4.35 4.25  HGB 12.7* 12.3*  HCT 38.6* 37.9*  MCV 88.7 89.2  MCH 29.2 28.9  MCHC 32.9 32.5  RDW 12.7 12.8  PLT 270 268    BNPNo results for input(s): BNP, PROBNP in the last 168 hours.   DDimer No results for input(s): DDIMER in the last 168 hours.   Radiology    DG Chest 2 View  Result Date: 07/13/2020 CLINICAL DATA:  Chest pain. EXAM: CHEST - 2 VIEW COMPARISON:  March 31, 2020. FINDINGS: The heart size and mediastinal contours are within normal limits. Both lungs are clear. No pneumothorax or pleural effusion is noted. The visualized skeletal structures are unremarkable. IMPRESSION: No active cardiopulmonary disease. Electronically Signed   By: Lupita Raider M.D.   On: 07/13/2020 12:53   ECHOCARDIOGRAM LIMITED  Result Date: 07/14/2020    ECHOCARDIOGRAM LIMITED REPORT   Patient Name:   Cody [name] Date of Exam: 07/14/2020 Medical Rec #:  [id number]      Height:       66.0 in Accession #:    [id number]     Weight:       170.3 lb Date of Birth:  [birth year]       BSA:          1.868 m Patient Age:    59 years       BP:           135/87 mmHg Patient Gender: M              HR:           68 bpm. Exam Location:  Inpatient Procedure: Limited Echo, Limited Color Doppler, Cardiac Doppler, Strain Analysis            and 3D Echo Indications:    Chest Pain 786.50 / R07.9  History:        Patient has prior history of Echocardiogram examinations, most                 recent 12/29/2019. CAD, PAD; Risk Factors:Hypertension,                 Dyslipidemia and Current Smoker. Substance abuse. Chest pain                 increases with holding breath.  Sonographer:    Leta Jungling RDCS Referring Phys: [phone] Juliet Vasbinder J Daniele Dillow IMPRESSIONS  1. Left ventricular ejection fraction, by estimation, is 55 to 60%. The left ventricle has normal function. The left ventricle has no regional wall motion abnormalities. There is moderate concentric left ventricular hypertrophy. Left ventricular diastolic parameters are consistent with Grade II diastolic dysfunction (pseudonormalization). The average left ventricular global longitudinal strain is -17.3 %. The global longitudinal strain is normal.  2. Right ventricular systolic function is normal. The right ventricular size is normal.  3. The mitral valve is normal in structure. No evidence of mitral valve regurgitation. No evidence of mitral stenosis.  4. The aortic valve is tricuspid. Aortic valve regurgitation is not visualized. No aortic stenosis is present.  5. The inferior vena cava is normal in size with greater than 50% respiratory variability, suggesting right atrial pressure of 3 mmHg. FINDINGS  Left Ventricle: Left ventricular ejection  fraction, by estimation, is 55 to 60%. The left ventricle has normal function. The left ventricle has no regional wall motion abnormalities. The average left ventricular global longitudinal strain is -17.3 %. The global longitudinal strain is normal. The left ventricular internal cavity size was normal in size. There is moderate concentric left ventricular hypertrophy. Left ventricular diastolic parameters are consistent with Grade II diastolic dysfunction (pseudonormalization). Indeterminate filling pressures. Right Ventricle: The right ventricular size is normal. No increase in right ventricular wall thickness. Right ventricular systolic function is normal. Left Atrium: Left atrial size was normal in size. Right Atrium: Right atrial size was normal in size. Pericardium: There is no evidence of pericardial effusion. Mitral Valve: The mitral valve is normal in structure. No evidence of mitral valve stenosis. Tricuspid Valve: The tricuspid valve is normal in structure. Tricuspid valve regurgitation is not demonstrated. No evidence of tricuspid stenosis. Aortic Valve: The aortic valve is tricuspid. Aortic valve regurgitation is not visualized. No aortic stenosis is present. Pulmonic Valve: The pulmonic valve was normal in structure. Pulmonic valve regurgitation is trivial. No evidence of pulmonic stenosis. Aorta: The aortic root is normal in size and structure. Venous: The inferior vena cava is normal in size with greater than 50% respiratory variability, suggesting right atrial pressure of 3 mmHg. IAS/Shunts: No atrial level shunt detected by color flow Doppler. LEFT VENTRICLE PLAX 2D LVIDd:         4.70 cm Diastology LVIDs:         3.60 cm LV e' medial:    5.18 cm/s LV PW:         1.30 cm LV E/e' medial:  13.7 LV IVS:        1.40 cm LV e' lateral:   7.20 cm/s                        LV E/e' lateral: 9.9                         2D Longitudinal Strain                        2D Strain GLS (A2C):   -19.5 %                         2D Strain GLS (A3C):   -16.6 %                        2D Strain GLS (A4C):   -15.8 %                        2D Strain GLS Avg:     -17.3 %                         3D Volume EF:                        3D EF:        49 %                        LV EDV:       154 ml  LV ESV:       79 ml                        LV SV:        75 ml LEFT ATRIUM         Index LA diam:    3.90 cm 2.09 cm/m  AORTIC VALVE LVOT Vmax:   85.30 cm/s LVOT Vmean:  59.200 cm/s LVOT VTI:    0.187 m MITRAL VALVE MV Area (PHT): 3.76 cm    SHUNTS MV Decel Time: 202 msec    Systemic VTI: 0.19 m MV E velocity: 71.00 cm/s MV A velocity: 48.70 cm/s MV E/A ratio:  1.46 Chilton Si MD Electronically signed by Chilton Si MD Signature Date/Time: 07/14/2020/1:16:02 PM    Final     Cardiac Studies      Patient Profile     59 y.o. male with CAd.   Admitted with cp.   Assessment & Plan    1.  Chest discomfort: Patient was admitted with chest pain for 8 hours.  His troponin levels were minimally elevated but the trend was not consistent with ACS.  His troponins have actually decreased through the admission despite having continued chest pain.  He does state that the pain is exactly like his pain prior to his stenting.  He is convinced that he needs a heart catheterization.  We discussed the risks, benefits, options.  He understands and agrees to proceed.    Echo shows normal LV function.     2.  Hx of cocaine abuse.   Will check UDS ( not performed yet)    For questions or updates, please contact [facility] Please consult [email or web address] for contact info under        Signed, Kristeen Miss, MD  07/15/2020, 10:12 AM

## 2020-07-16 ENCOUNTER — Inpatient Hospital Stay (HOSPITAL_COMMUNITY): Payer: Medicare Other

## 2020-07-16 DIAGNOSIS — F1410 Cocaine abuse, uncomplicated: Secondary | ICD-10-CM

## 2020-07-16 DIAGNOSIS — I2510 Atherosclerotic heart disease of native coronary artery without angina pectoris: Secondary | ICD-10-CM | POA: Diagnosis not present

## 2020-07-16 DIAGNOSIS — I10 Essential (primary) hypertension: Secondary | ICD-10-CM

## 2020-07-16 DIAGNOSIS — R079 Chest pain, unspecified: Secondary | ICD-10-CM | POA: Diagnosis not present

## 2020-07-16 LAB — LIPID PANEL
Cholesterol: 117 mg/dL (ref 0–200)
HDL: 28 mg/dL — ABNORMAL LOW (ref >40)
LDL Cholesterol: 73 mg/dL (ref 0–99)
Total CHOL/HDL Ratio: 4.2 RATIO
Triglycerides: 79 mg/dL (ref <150)
VLDL: 16 mg/dL (ref 0–40)

## 2020-07-16 LAB — BASIC METABOLIC PANEL
Anion gap: 7 (ref 5–15)
BUN: 7 mg/dL (ref 6–20)
CO2: 27 mmol/L (ref 22–32)
Calcium: 9.2 mg/dL (ref 8.9–10.3)
Chloride: 108 mmol/L (ref 98–111)
Creatinine, Ser: 0.99 mg/dL (ref 0.61–1.24)
GFR, Estimated: >60 mL/min (ref >60)
Glucose, Bld: 84 mg/dL (ref 70–99)
Potassium: 4 mmol/L (ref 3.5–5.1)
Sodium: 142 mmol/L (ref 135–145)

## 2020-07-16 MED ORDER — METHYLPREDNISOLONE SODIUM SUCC 125 MG IJ SOLR
125.0000 mg | Freq: Once | INTRAMUSCULAR | Status: AC
  Administered 2020-07-17: 125 mg via INTRAVENOUS
  Filled 2020-07-16: qty 2

## 2020-07-16 MED ORDER — DIPHENHYDRAMINE HCL 50 MG/ML IJ SOLN
25.0000 mg | Freq: Once | INTRAMUSCULAR | Status: AC
  Administered 2020-07-17: 25 mg via INTRAVENOUS
  Filled 2020-07-16 (×2): qty 1

## 2020-07-16 NOTE — Progress Notes (Signed)
Progress Note  Patient Name: Cody [name] Date of Encounter: 07/16/2020  Primary Cardiologist: Thomasene Ripple, DO   Subjective   Chest pain free this morning. Has complaints about attempts to place an IV this morning. Also reports c/f possible stroke - has intermittent weakness and fuzzy headed sensation.   Inpatient Medications    Scheduled Meds: . acetaminophen  650 mg Oral Q6H  . amLODipine  10 mg Oral Daily  . [START ON 07/17/2020] aspirin EC  81 mg Oral Daily  . atorvastatin  40 mg Oral Daily  . carvedilol  12.5 mg Oral BID  . cloNIDine  0.1 mg Oral TID  . diphenhydrAMINE  25 mg Intravenous Once  . enoxaparin (LOVENOX) injection  40 mg Subcutaneous Q24H  . ezetimibe  10 mg Oral QHS  . gabapentin  300 mg Oral BID  . methylPREDNISolone sodium succinate  125 mg Intravenous Once  . pantoprazole (PROTONIX) IV  40 mg Intravenous Q12H  . pneumococcal 23 valent vaccine  0.5 mL Intramuscular Tomorrow-1000  . ranolazine  500 mg Oral BID  . sodium chloride flush  3 mL Intravenous Q12H  . sucralfate  1 g Oral TID WC & HS  . traZODone  50 mg Oral QHS   Continuous Infusions: . sodium chloride    . sodium chloride 1 mL/kg/hr (07/16/20 0524)   PRN Meds: sodium chloride, nitroGLYCERIN, sodium chloride flush   Vital Signs    Vitals:   07/15/20 1546 07/15/20 2228 07/15/20 2230 07/16/20 0338  BP: 114/69 (!) 141/81  132/77  Pulse: 62   62  Resp: 14   18  Temp: 98 F (36.7 C)  98.2 F (36.8 C) 98.7 F (37.1 C)  TempSrc: Oral  Oral Oral  SpO2: 99%     Weight:    79.8 kg  Height:        Intake/Output Summary (Last 24 hours) at 07/16/2020 1108 Last data filed at 07/16/2020 1000 Gross per 24 hour  Intake 1040.8 ml  Output 350 ml  Net 690.8 ml   Filed Weights   07/14/20 0702 07/15/20 0500 07/16/20 0338  Weight: 77.2 kg 79.6 kg 79.8 kg    Telemetry    Sinus rhythm/sinus bradycardia - Personally Reviewed  ECG    No new tracings - Personally Reviewed  Physical  Exam   GEN: No acute distress.   Neck: No JVD, no carotid bruits Cardiac: RRR, no murmurs, rubs, or gallops.  Respiratory: Clear to auscultation bilaterally, no wheezes/ rales/ rhonchi GI: NABS, Soft, nontender, non-distended  MS: No edema; No deformity. Neuro:  Nonfocal, moving all extremities spontaneously Psych: tangential  Labs    Chemistry Recent Labs  Lab 07/13/20 1359 07/14/20 0209  NA 141 140  K 3.1* 3.9  CL 104 102  CO2 25 24  GLUCOSE 86 72  BUN 13 18  CREATININE 1.07 1.08  CALCIUM 9.2 8.9  GFRNONAA >60 >60  ANIONGAP 12 14     Hematology Recent Labs  Lab 07/13/20 1359 07/14/20 0209  WBC 9.8 8.6  RBC 4.35 4.25  HGB 12.7* 12.3*  HCT 38.6* 37.9*  MCV 88.7 89.2  MCH 29.2 28.9  MCHC 32.9 32.5  RDW 12.7 12.8  PLT 270 268    Cardiac EnzymesNo results for input(s): TROPONINI in the last 168 hours. No results for input(s): TROPIPOC in the last 168 hours.   BNPNo results for input(s): BNP, PROBNP in the last 168 hours.   DDimer No results for input(s): DDIMER in the last  168 hours.   Radiology    No results found.  Cardiac Studies   Echocardiogram 07/14/20: 1. Left ventricular ejection fraction, by estimation, is 55 to 60%. The  left ventricle has normal function. The left ventricle has no regional  wall motion abnormalities. There is moderate concentric left ventricular  hypertrophy. Left ventricular  diastolic parameters are consistent with Grade II diastolic dysfunction  (pseudonormalization). The average left ventricular global longitudinal  strain is -17.3 %. The global longitudinal strain is normal.  2. Right ventricular systolic function is normal. The right ventricular  size is normal.  3. The mitral valve is normal in structure. No evidence of mitral valve  regurgitation. No evidence of mitral stenosis.  4. The aortic valve is tricuspid. Aortic valve regurgitation is not  visualized. No aortic stenosis is present.  5. The inferior  vena cava is normal in size with greater than 50%  respiratory variability, suggesting right atrial pressure of 3 mmHg.   Patient Profile     59 y.o. male with a PMH of CAD s/p prior stenting or RCA and LAD, HTN, HLD, PAD, GERD, Tobacco abuse, and cocaine abuse who is being followed by cardiology for the evaluation of chest pain.   Assessment & Plan    1. Chest pain in patient with known CAD s/p stenting to RCA and LAD: patent stents without restenosis on last [facility] 03/2020 with non-obstructive moderate disease in MLCx and mLAD (prior to old stent) and mild disease in RCA surrounding prior stents. He presents again with chest pain c/w prior symptoms leading up to PCI. HsTrop with low flat trend not c/w ACS. EKG is non-ischemic. Utox +cocaine. Limited echo with EF 55-60%, G2DD, no RWMA, and no significant valvular abnormalities. He is adamant that he needs another heart catheterization.  - Await LHC today - Continue aspirin, statin, and zetia - Continue ranexa  - Continue amlodipine and carvedilol  2. HTN: BP stable - Continue amlodipine, carvedilol, and clonidine  3. HLD: LDL 112 03/2020 - Will add on FLP to AM labs - if not at goal of LDL <70, can uptitrate atorvastatin - Continue atorvastatin and zetia  4. Substance abuse: ongoing tobacco and cocaine abuse (+ on Utox this admission). Cocaine use could be contributing to #1 - Continue to encrourage cessation  5. Intermittent weakness: patient describes vague intermittent weakness and fuzzy sensation in his head. He is concerned for a possible stroke - Will check a CT head wo contrast - could consider referral to neurology outpatient if unrevealing - will defer further management to primary team.   For questions or updates, please contact [facility] Please consult [email or web address] for contact info under Cardiology/STEMI.      Signed, Beatriz Stallion, PA-C  07/16/2020, 11:08 AM   [phone]

## 2020-07-16 NOTE — Progress Notes (Signed)
PROGRESS NOTE    Cody [name]  EML:[id number] DOB: 1960/10/21 DOA: 07/13/2020 PCP: Ailene Ravel, MD    Brief Narrative:  Cody [name] was admitted with a working diagnosis of chest pain, which has ruled out for ute coronary syndrome. Persistent chest pain.   59 year old male with past medical history for cocaine abuse, coronary disease status post angioplasty (RCA and LAD) in 2013, and non obstructive coronary disease in 2021, hypertension, dyslipidemia and peripheral vascular disease. Patient reported precordial chest pain, intermittent for about 2 weeks, painwas associated with nausea and paresthesias lower extremities and hands. On his initial physical examination blood pressure 165/96, heart rate 61, temperature 99.1, respiratory 21, oxygen saturation 99%. His lungs were clear to auscultation bilaterally, heart S1-S2, present rhythm, soft abdomen, no lower extremity edema. Sodium 141, potassium 3.1, chloride 104, bicarb 25, glucose 86, BUN 13, creatinine 1.0, troponin I 26-23-21, white count 9.8, hemoglobin 12.7, hematocrit 38.6, platelets 270. SARS COVID-19 negative. Chest radiograph no infiltrates. EKG 60 bpm, normal axis, normal intervals, sinus rhythm, no ST segment changes, negative T waves lead II, III and aVF.  Patient has ruled out for acute coronary syndrome, but continue to complain of chest pain and dyspepsia.  Placed on antiacid therapy regimen. Patient with no frank angina, symptoms seem to be more gastrointestinal in origin.   Continue to have multiple complains, with photophobia and left eye pain. Has worked with speech therapy and physical therapy.    Assessment & Plan:   Principal Problem:   Chest pain Active Problems:   Coronary artery disease   HTN (hypertension)   PAD (peripheral artery disease) (HCC)   Cocaine abuse (HCC)   PUD (peptic ulcer disease)   1. Chest pain/ CAD/ dyslipidemia/ cocaine use.    Troponin mild elevation not pattern for  ACS. Echocardiogram with no wall motion abnormalities and no pericardial effusion. Follow up EKG with normalization of inverted inferior T waves.  Coronary angiography this year with non obstructive coronary disease.   Urine drug scree is positive for cocaine.  Continue with aspirin, atorvastatin/ ezetimibe. Ptaient on Ranolazine.   Plan cardiac catheterization today.   2. Peptic ulcer disease.  Today he does not report further dyspepsia and his main complain seems to be his left eye.  I assumed antiacid therapy is improving his chest pain, will continue with pantoprazole and sucralfate. No clinical signs of bleeding ulcer.   3. HTN.  Continue blood pressure control with carvedilol, amlodipine and clonidine for blood pressure control.  Advised to avoid cocaine.   4. PAD. continue aspirin and statin.   5. Insomnia/paresthesias. Continue with trazodone tonight. Follow up with head CT report.   6. Substance use. Urine drug screen positive for cocaine.    Status is: Inpatient  Remains inpatient appropriate because:Inpatient level of care appropriate due to severity of illness   Dispo:  Patient From: Home  Planned Disposition: Home  Expected discharge date: 07/17/20  Medically stable for discharge: No Plan for dc home in am, if neurologic and cardiac work up negative, patient agrees with this plan. He will follow up as outpatient with ophthalmology .         DVT prophylaxis: Enoxaparin  Code Status:    full  Family Communication:  No family at the bedside       Consultants:   Cardiology     Subjective: Patient complains of left eye pain, and paresthesias, no mention of chest pain or dyspnea. No nausea or vomiting.   Objective: Vitals:  07/15/20 2228 07/15/20 2230 07/16/20 0338 07/16/20 1100  BP: (!) 141/81  132/77 (!) 143/94  Pulse:   62 68  Resp:   18 18  Temp:  98.2 F (36.8 C) 98.7 F (37.1 C) 98.1 F (36.7 C)  TempSrc:  Oral Oral Oral  SpO2:     96%  Weight:   79.8 kg   Height:        Intake/Output Summary (Last 24 hours) at 07/16/2020 1354 Last data filed at 07/16/2020 1300 Gross per 24 hour  Intake 800.8 ml  Output 750 ml  Net 50.8 ml   Filed Weights   07/14/20 0702 07/15/20 0500 07/16/20 0338  Weight: 77.2 kg 79.6 kg 79.8 kg    Examination:   General: Not in pain or dyspnea, Neurology: Awake and alert, non focal  E ENT: no pallor, no icterus, oral mucosa moist Cardiovascular: No JVD. S1-S2 present, rhythmic, no gallops, rubs, or murmurs. No lower extremity edema. Pulmonary: positive breath sounds bilaterally, adequate air movement, no wheezing, rhonchi or rales. Gastrointestinal. Abdomen soft and non tender Skin. No rashes Musculoskeletal: no joint deformities     Data Reviewed: I have personally reviewed following labs and imaging studies  CBC: Recent Labs  Lab 07/13/20 1359 07/14/20 0209  WBC 9.8 8.6  NEUTROABS 4.7  --   HGB 12.7* 12.3*  HCT 38.6* 37.9*  MCV 88.7 89.2  PLT 270 268   Basic Metabolic Panel: Recent Labs  Lab 07/13/20 1359 07/14/20 0209  NA 141 140  K 3.1* 3.9  CL 104 102  CO2 25 24  GLUCOSE 86 72  BUN 13 18  CREATININE 1.07 1.08  CALCIUM 9.2 8.9  MG  --  2.6*   GFR: Estimated Creatinine Clearance: 74.9 mL/min (by C-G formula based on SCr of 1.08 mg/dL). Liver Function Tests: No results for input(s): AST, ALT, ALKPHOS, BILITOT, PROT, ALBUMIN in the last 168 hours. No results for input(s): LIPASE, AMYLASE in the last 168 hours. No results for input(s): AMMONIA in the last 168 hours. Coagulation Profile: No results for input(s): INR, PROTIME in the last 168 hours. Cardiac Enzymes: No results for input(s): CKTOTAL, CKMB, CKMBINDEX, TROPONINI in the last 168 hours. BNP (last 3 results) No results for input(s): PROBNP in the last 8760 hours. HbA1C: No results for input(s): HGBA1C in the last 72 hours. CBG: No results for input(s): GLUCAP in the last 168 hours. Lipid  Profile: Recent Labs    07/16/20 1200  CHOL 117  HDL 28*  LDLCALC 73  TRIG 79  CHOLHDL 4.2   Thyroid Function Tests: No results for input(s): TSH, T4TOTAL, FREET4, T3FREE, THYROIDAB in the last 72 hours. Anemia Panel: No results for input(s): VITAMINB12, FOLATE, FERRITIN, TIBC, IRON, RETICCTPCT in the last 72 hours.    Radiology Studies: I have reviewed all of the imaging during this hospital visit personally     Scheduled Meds: . acetaminophen  650 mg Oral Q6H  . amLODipine  10 mg Oral Daily  . [START ON 07/17/2020] aspirin EC  81 mg Oral Daily  . atorvastatin  40 mg Oral Daily  . carvedilol  12.5 mg Oral BID  . cloNIDine  0.1 mg Oral TID  . diphenhydrAMINE  25 mg Intravenous Once  . enoxaparin (LOVENOX) injection  40 mg Subcutaneous Q24H  . ezetimibe  10 mg Oral QHS  . gabapentin  300 mg Oral BID  . methylPREDNISolone sodium succinate  125 mg Intravenous Once  . pantoprazole (PROTONIX) IV  40 mg  Intravenous Q12H  . pneumococcal 23 valent vaccine  0.5 mL Intramuscular Tomorrow-1000  . ranolazine  500 mg Oral BID  . sodium chloride flush  3 mL Intravenous Q12H  . sucralfate  1 g Oral TID WC & HS  . traZODone  50 mg Oral QHS   Continuous Infusions: . sodium chloride    . sodium chloride 1 mL/kg/hr (07/16/20 0524)     LOS: 2 days        Latrease Kunde Annett Gula, MD

## 2020-07-16 NOTE — Progress Notes (Signed)
IV team consult note:  Explained to patient about IV consult by RN,patient stated the current IV to left arm was placed via USN; explained procedure .PIV attempt failed x1,patient pulled arm back resulting for IV needle to come out.Patient stated In a loud tone of voice, " Whats your name again,youre not listening to me,Im going to report you." I stated that he needs an IV for procedure and can have this IV by USN if he prefers." Patient stated " I told you, I know my arm." Patient did not request an USN initially but informed this nurse that current IV was placed by USN. I left the room and in the process of bringing  side rail up and bed down for safety,patient pulled hand up to my face and yelled." Get out of here." " You just made the wrong move." Left patient with Side rails in up position and bed down. Informed nurse Raynelle Fanning and Press photographer. 2nd IV nurse to place IV by USN.

## 2020-07-16 NOTE — Evaluation (Signed)
Physical Therapy Evaluation °Patient Details °Name: Cody [name] °MRN: [id number] °DOB: 03/07/1961 °Today's Date: 07/16/2020 ° ° °History of Present Illness ° 59yo male c/o chest pain for the past 2 weeks associated with nausea and parasthesias in BLEs and hands. ACS ruled out. Covid negative. Admitted for chest pain w/u. PMH PAD, HTN, cocaine abuse, hx of cardiac cath  °Clinical Impression °  °Patient received in bed, pleasant with therapy but frustrated in general, cooperated with PT but very curious as to "why they're having me do therapy before surgery". At first able to mobility on an independent basis with no device, however when we got to the hallway he suddenly staggered backwards and then needed MinA and IV pole to safely return to sitting at EOB. Reported dizziness, however all vitals stable and WNL on room air with BP slightly elevated at 135/93. Complained of BLE pain, B hand parasthesia during session however per chart review this is not acute; also demonstrated some stuttering sitting at EOB at EOS but he reports this is also not new and he has seen speech therapy in the past for this problem. Returned to supine with all needs met, bed alarm active this morning, RN aware of patient status and events of session. Would likely benefit from RW and HHPT follow-up moving forward.  °   °Follow Up Recommendations Home health PT ° °  °Equipment Recommendations ° Rolling walker with 5" wheels  °  °Recommendations for Other Services    ° °  °Precautions / Restrictions Precautions °Precautions: Fall;Other (comment) °Precaution Comments: L eye visual impairment °Restrictions °Weight Bearing Restrictions: No  ° °  ° °Mobility ° Bed Mobility °Overal bed mobility: Needs Assistance °Bed Mobility: Supine to Sit;Sit to Supine °  °  °Supine to sit: Independent;HOB elevated °Sit to supine: Independent;HOB elevated °  °  ° °Transfers °Overall transfer level: Needs assistance °Equipment used: None °Transfers: Sit to/from  Stand °Sit to Stand: Supervision °  °  °  °  °General transfer comment: S for safety, line management ° °Ambulation/Gait °Ambulation/Gait assistance: Min assist °Gait Distance (Feet): 20 Feet °Assistive device: IV Pole °Gait Pattern/deviations: Step-through pattern;Staggering right;Staggering left;Trunk flexed;Wide base of support °Gait velocity: decreased °  °General Gait Details: first half of gait pattern WNL, step through pattern however when we got to the hallway he suddenly staggered backward and needed MinA for balance/use of IV pole to return to bed ° °Stairs °  °  °  °  °  ° °Wheelchair Mobility °  ° °Modified Rankin (Stroke Patients Only) °  ° °  ° °Balance Overall balance assessment: Needs assistance °Sitting-balance support: Feet supported °Sitting balance-Leahy Scale: Normal °  °  °Standing balance support: Bilateral upper extremity supported;During functional activity °Standing balance-Leahy Scale: Poor °Standing balance comment: suddenly lost balance backwards when out in hallway, needed MinA/IV pole to return to room safely °  °  °  °  °  °  °  °  °  °  °  °   ° ° ° °Pertinent Vitals/Pain Pain Assessment: Faces °Faces Pain Scale: Hurts little more °Pain Location: pain in hands and BLEs °Pain Descriptors / Indicators: Sharp;Stabbing °Pain Intervention(s): Limited activity within patient's tolerance;Monitored during session;Patient requesting pain meds-RN notified  ° ° °Home Living Family/patient expects to be discharged to:: Private residence °Living Arrangements: Spouse/significant other °Available Help at Discharge: Family;Available 24 hours/day °Type of Home: House °Home Access: Stairs to enter °Entrance Stairs-Rails:  (deck railing, no rails on steps themselves) °  Entrance Stairs-Number of Steps: 4 up the deck- can hold onto the deck rails °Home Layout: One level °Home Equipment: None °Additional Comments: wife has a walker but only uses it once in awhile  °  °Prior Function Level of Independence:  Independent  ° °  ° °  ° °   ° ° ° °Hand Dominance  °   ° °  °Extremity/Trunk Assessment  ° Upper Extremity Assessment °Upper Extremity Assessment: Defer to OT evaluation °  ° °Lower Extremity Assessment °Lower Extremity Assessment: Generalized weakness °  ° °Cervical / Trunk Assessment °Cervical / Trunk Assessment: Normal  °Communication  ° Communication: No difficulties  °Cognition Arousal/Alertness: Awake/alert °Behavior During Therapy: WFL for tasks assessed/performed °Overall Cognitive Status: Within Functional Limits for tasks assessed °  °  °  °  °  °  °  °  °  °  °  °  °  °  °  °  °General Comments: pleasant with therapy, just very talkative and tangential; poor insight into safety and deficits °  ° °  °General Comments General comments (skin integrity, edema, etc.): VSS on RA- was dizzy after gait but HR and O2 WNL, BP 135/93 ° °  °Exercises    ° °Assessment/Plan  °  °PT Assessment Patient needs continued PT services  °PT Problem List Decreased strength;Decreased knowledge of use of DME;Decreased activity tolerance;Decreased safety awareness;Decreased balance;Decreased mobility;Decreased coordination ° °   °  °PT Treatment Interventions DME instruction;Balance training;Gait training;Stair training;Functional mobility training;Patient/family education;Therapeutic activities;Therapeutic exercise   ° °PT Goals (Current goals can be found in the Care Plan section)  °Acute Rehab PT Goals °Patient Stated Goal: get surgery today °PT Goal Formulation: With patient °Time For Goal Achievement: 07/30/20 °Potential to Achieve Goals: Fair ° °  °Frequency Min 3X/week °  °Barriers to discharge   °   ° ° °Co-evaluation   °  °  °  °  ° ° °  °AM-PAC PT "6 Clicks" Mobility  °Outcome Measure Help needed turning from your back to your side while in a flat bed without using bedrails?: None °Help needed moving from lying on your back to sitting on the side of a flat bed without using bedrails?: None °Help needed moving to and  from a bed to a chair (including a wheelchair)?: None °Help needed standing up from a chair using your arms (e.g., wheelchair or bedside chair)?: None °Help needed to walk in hospital room?: A Little °Help needed climbing 3-5 steps with a railing? : A Little °6 Click Score: 22 ° °  °End of Session   °Activity Tolerance: Other (comment) (limited by dizziness) °Patient left: in bed;with call bell/phone within reach;with bed alarm set °Nurse Communication: Mobility status;Other (comment) (dizziness during session/unsteadiness during session, increasing pain in BLEs and hands, increased stuttering at EOS) °PT Visit Diagnosis: Unsteadiness on feet (R26.81);Muscle weakness (generalized) (M62.81);Difficulty in walking, not elsewhere classified (R26.2) °  ° °Time: 0904-0937 °PT Time Calculation (min) (ACUTE ONLY): 33 min ° ° °Charges:   PT Evaluation °$PT Eval Moderate Complexity: 1 Mod °PT Treatments °$Gait Training: 8-22 mins °  °   ° ° °Kristen U PT, DPT, PN1  ° °Supplemental Physical Therapist °[place]  ° ° °Pager [phone] °Acute Rehab Office [phone] ° ° ° °

## 2020-07-16 NOTE — H&P (View-Only) (Signed)
Progress Note  Patient Name: Cody [name] Date of Encounter: 07/16/2020  Primary Cardiologist: Thomasene Ripple, DO   Subjective   Chest pain free this morning. Has complaints about attempts to place an IV this morning. Also reports c/f possible stroke - has intermittent weakness and fuzzy headed sensation.   Inpatient Medications    Scheduled Meds: . acetaminophen  650 mg Oral Q6H  . amLODipine  10 mg Oral Daily  . [START ON 07/17/2020] aspirin EC  81 mg Oral Daily  . atorvastatin  40 mg Oral Daily  . carvedilol  12.5 mg Oral BID  . cloNIDine  0.1 mg Oral TID  . diphenhydrAMINE  25 mg Intravenous Once  . enoxaparin (LOVENOX) injection  40 mg Subcutaneous Q24H  . ezetimibe  10 mg Oral QHS  . gabapentin  300 mg Oral BID  . methylPREDNISolone sodium succinate  125 mg Intravenous Once  . pantoprazole (PROTONIX) IV  40 mg Intravenous Q12H  . pneumococcal 23 valent vaccine  0.5 mL Intramuscular Tomorrow-1000  . ranolazine  500 mg Oral BID  . sodium chloride flush  3 mL Intravenous Q12H  . sucralfate  1 g Oral TID WC & HS  . traZODone  50 mg Oral QHS   Continuous Infusions: . sodium chloride    . sodium chloride 1 mL/kg/hr (07/16/20 0524)   PRN Meds: sodium chloride, nitroGLYCERIN, sodium chloride flush   Vital Signs    Vitals:   07/15/20 1546 07/15/20 2228 07/15/20 2230 07/16/20 0338  BP: 114/69 (!) 141/81  132/77  Pulse: 62   62  Resp: 14   18  Temp: 98 F (36.7 C)  98.2 F (36.8 C) 98.7 F (37.1 C)  TempSrc: Oral  Oral Oral  SpO2: 99%     Weight:    79.8 kg  Height:        Intake/Output Summary (Last 24 hours) at 07/16/2020 1108 Last data filed at 07/16/2020 1000 Gross per 24 hour  Intake 1040.8 ml  Output 350 ml  Net 690.8 ml   Filed Weights   07/14/20 0702 07/15/20 0500 07/16/20 0338  Weight: 77.2 kg 79.6 kg 79.8 kg    Telemetry    Sinus rhythm/sinus bradycardia - Personally Reviewed  ECG    No new tracings - Personally Reviewed  Physical  Exam   GEN: No acute distress.   Neck: No JVD, no carotid bruits Cardiac: RRR, no murmurs, rubs, or gallops.  Respiratory: Clear to auscultation bilaterally, no wheezes/ rales/ rhonchi GI: NABS, Soft, nontender, non-distended  MS: No edema; No deformity. Neuro:  Nonfocal, moving all extremities spontaneously Psych: tangential  Labs    Chemistry Recent Labs  Lab 07/13/20 1359 07/14/20 0209  NA 141 140  K 3.1* 3.9  CL 104 102  CO2 25 24  GLUCOSE 86 72  BUN 13 18  CREATININE 1.07 1.08  CALCIUM 9.2 8.9  GFRNONAA >60 >60  ANIONGAP 12 14     Hematology Recent Labs  Lab 07/13/20 1359 07/14/20 0209  WBC 9.8 8.6  RBC 4.35 4.25  HGB 12.7* 12.3*  HCT 38.6* 37.9*  MCV 88.7 89.2  MCH 29.2 28.9  MCHC 32.9 32.5  RDW 12.7 12.8  PLT 270 268    Cardiac EnzymesNo results for input(s): TROPONINI in the last 168 hours. No results for input(s): TROPIPOC in the last 168 hours.   BNPNo results for input(s): BNP, PROBNP in the last 168 hours.   DDimer No results for input(s): DDIMER in the last  168 hours.   Radiology    No results found.  Cardiac Studies   Echocardiogram 07/14/20: 1. Left ventricular ejection fraction, by estimation, is 55 to 60%. The  left ventricle has normal function. The left ventricle has no regional  wall motion abnormalities. There is moderate concentric left ventricular  hypertrophy. Left ventricular  diastolic parameters are consistent with Grade II diastolic dysfunction  (pseudonormalization). The average left ventricular global longitudinal  strain is -17.3 %. The global longitudinal strain is normal.  2. Right ventricular systolic function is normal. The right ventricular  size is normal.  3. The mitral valve is normal in structure. No evidence of mitral valve  regurgitation. No evidence of mitral stenosis.  4. The aortic valve is tricuspid. Aortic valve regurgitation is not  visualized. No aortic stenosis is present.  5. The inferior  vena cava is normal in size with greater than 50%  respiratory variability, suggesting right atrial pressure of 3 mmHg.   Patient Profile     59 y.o. male with a PMH of CAD s/p prior stenting or RCA and LAD, HTN, HLD, PAD, GERD, Tobacco abuse, and cocaine abuse who is being followed by cardiology for the evaluation of chest pain.   Assessment & Plan    1. Chest pain in patient with known CAD s/p stenting to RCA and LAD: patent stents without restenosis on last LHC 03/2020 with non-obstructive moderate disease in MLCx and mLAD (prior to old stent) and mild disease in RCA surrounding prior stents. He presents again with chest pain c/w prior symptoms leading up to PCI. HsTrop with low flat trend not c/w ACS. EKG is non-ischemic. Utox +cocaine. Limited echo with EF 55-60%, G2DD, no RWMA, and no significant valvular abnormalities. He is adamant that he needs another heart catheterization.  - Await LHC today - Continue aspirin, statin, and zetia - Continue ranexa  - Continue amlodipine and carvedilol  2. HTN: BP stable - Continue amlodipine, carvedilol, and clonidine  3. HLD: LDL 112 03/2020 - Will add on FLP to AM labs - if not at goal of LDL <70, can uptitrate atorvastatin - Continue atorvastatin and zetia  4. Substance abuse: ongoing tobacco and cocaine abuse (+ on Utox this admission). Cocaine use could be contributing to #1 - Continue to encrourage cessation  5. Intermittent weakness: patient describes vague intermittent weakness and fuzzy sensation in his head. He is concerned for a possible stroke - Will check a CT head wo contrast - could consider referral to neurology outpatient if unrevealing - will defer further management to primary team.   For questions or updates, please contact [facility] Please consult [email or web address] for contact info under Cardiology/STEMI.      Signed, Latravia Southgate M. Logan Vegh, PA-C  07/16/2020, 11:08 AM   [phone]  

## 2020-07-16 NOTE — Progress Notes (Signed)
Pt reminded multiple times to save urine sample for UDS whole day. Notified Charge Nurse too for not getting sample.Pt behavior been boisterous, rude while my bedside report exchange and while providing him bedside care.
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
## 2020-07-17 ENCOUNTER — Encounter (HOSPITAL_COMMUNITY): Payer: Self-pay | Admitting: Cardiology

## 2020-07-17 ENCOUNTER — Encounter (HOSPITAL_COMMUNITY)
Admission: EM | Disposition: A | Discharge status: Home / Self Care | Payer: Self-pay | Source: Home / Self Care | Attending: Internal Medicine

## 2020-07-17 DIAGNOSIS — I25110 Atherosclerotic heart disease of native coronary artery with unstable angina pectoris: Secondary | ICD-10-CM

## 2020-07-17 DIAGNOSIS — I2510 Atherosclerotic heart disease of native coronary artery without angina pectoris: Secondary | ICD-10-CM | POA: Diagnosis not present

## 2020-07-17 DIAGNOSIS — R262 Difficulty in walking, not elsewhere classified: Secondary | ICD-10-CM

## 2020-07-17 DIAGNOSIS — F1410 Cocaine abuse, uncomplicated: Secondary | ICD-10-CM | POA: Diagnosis not present

## 2020-07-17 DIAGNOSIS — I10 Essential (primary) hypertension: Secondary | ICD-10-CM | POA: Diagnosis not present

## 2020-07-17 DIAGNOSIS — R079 Chest pain, unspecified: Secondary | ICD-10-CM | POA: Diagnosis not present

## 2020-07-17 HISTORY — DX: Difficulty in walking, not elsewhere classified: R26.2

## 2020-07-17 HISTORY — PX: INTRAVASCULAR PRESSURE WIRE/FFR STUDY: CATH118243

## 2020-07-17 HISTORY — PX: LEFT HEART CATH AND CORONARY ANGIOGRAPHY: CATH118249

## 2020-07-17 LAB — POCT ACTIVATED CLOTTING TIME
Activated Clotting Time: 263 seconds
Activated Clotting Time: 307 seconds

## 2020-07-17 LAB — D-DIMER, QUANTITATIVE: D-Dimer, Quant: 0.36 ug/mL-FEU (ref 0.00–0.50)

## 2020-07-17 SURGERY — LEFT HEART CATH AND CORONARY ANGIOGRAPHY
Anesthesia: LOCAL

## 2020-07-17 MED ORDER — LIDOCAINE HCL (PF) 1 % IJ SOLN
INTRAMUSCULAR | Status: AC
  Filled 2020-07-17: qty 30

## 2020-07-17 MED ORDER — MIDAZOLAM HCL 2 MG/2ML IJ SOLN
INTRAMUSCULAR | Status: DC | PRN
  Administered 2020-07-17 (×2): 1 mg via INTRAVENOUS

## 2020-07-17 MED ORDER — LABETALOL HCL 5 MG/ML IV SOLN: 10.0000 mg | INTRAVENOUS | Status: AC | PRN

## 2020-07-17 MED ORDER — ATORVASTATIN CALCIUM 80 MG PO TABS: 80.0000 mg | ORAL_TABLET | Freq: Every day | ORAL | 0 refills | Status: DC

## 2020-07-17 MED ORDER — HYDRALAZINE HCL 20 MG/ML IJ SOLN: 10.0000 mg | INTRAMUSCULAR | Status: AC | PRN

## 2020-07-17 MED ORDER — HEPARIN (PORCINE) IN NACL 1000-0.9 UT/500ML-% IV SOLN
INTRAVENOUS | Status: AC
  Filled 2020-07-17: qty 1000

## 2020-07-17 MED ORDER — ONDANSETRON HCL 4 MG/2ML IJ SOLN
4.0000 mg | Freq: Four times a day (QID) | INTRAMUSCULAR | Status: DC | PRN
  Administered 2020-07-20: 4 mg via INTRAVENOUS
  Filled 2020-07-17: qty 2

## 2020-07-17 MED ORDER — FENTANYL CITRATE (PF) 100 MCG/2ML IJ SOLN
INTRAMUSCULAR | Status: AC
  Filled 2020-07-17: qty 2

## 2020-07-17 MED ORDER — VERAPAMIL HCL 2.5 MG/ML IV SOLN
INTRAVENOUS | Status: AC
  Filled 2020-07-17: qty 2

## 2020-07-17 MED ORDER — MELOXICAM 15 MG PO TABS: 15.0000 mg | ORAL_TABLET | Freq: Every day | ORAL | 0 refills | Status: AC

## 2020-07-17 MED ORDER — SODIUM CHLORIDE 0.9% FLUSH
3.0000 mL | Freq: Two times a day (BID) | INTRAVENOUS | Status: DC
  Administered 2020-07-18 – 2020-07-19 (×4): 3 mL via INTRAVENOUS

## 2020-07-17 MED ORDER — HEPARIN SODIUM (PORCINE) 1000 UNIT/ML IJ SOLN
INTRAMUSCULAR | Status: DC | PRN
  Administered 2020-07-17: 4000 [IU] via INTRAVENOUS
  Administered 2020-07-17: 3000 [IU] via INTRAVENOUS
  Administered 2020-07-17: 5000 [IU] via INTRAVENOUS

## 2020-07-17 MED ORDER — IOHEXOL 350 MG/ML SOLN
INTRAVENOUS | Status: DC | PRN
  Administered 2020-07-17: 95 mL

## 2020-07-17 MED ORDER — ADENOSINE (DIAGNOSTIC) 140MCG/KG/MIN
INTRAVENOUS | Status: DC | PRN
  Administered 2020-07-17 (×2): 140 ug/kg/min via INTRAVENOUS

## 2020-07-17 MED ORDER — ATORVASTATIN CALCIUM 80 MG PO TABS
80.0000 mg | ORAL_TABLET | Freq: Every day | ORAL | Status: DC
  Administered 2020-07-18 – 2020-07-20 (×3): 80 mg via ORAL
  Filled 2020-07-17 (×3): qty 1

## 2020-07-17 MED ORDER — FENTANYL CITRATE (PF) 100 MCG/2ML IJ SOLN
INTRAMUSCULAR | Status: DC | PRN
Start: 2020-07-17 — End: 2020-07-17
  Administered 2020-07-17 (×2): 25 ug via INTRAVENOUS

## 2020-07-17 MED ORDER — SODIUM CHLORIDE 0.9 % IV SOLN: INTRAVENOUS | Status: AC

## 2020-07-17 MED ORDER — ADENOSINE 6 MG/2ML IV SOLN
INTRAVENOUS | Status: AC
  Filled 2020-07-17: qty 8

## 2020-07-17 MED ORDER — HEPARIN (PORCINE) IN NACL 1000-0.9 UT/500ML-% IV SOLN
INTRAVENOUS | Status: DC | PRN
  Administered 2020-07-17: 500 mL

## 2020-07-17 MED ORDER — ACETAMINOPHEN 325 MG PO TABS: 650.0000 mg | ORAL_TABLET | ORAL | Status: DC | PRN

## 2020-07-17 MED ORDER — HEPARIN SODIUM (PORCINE) 1000 UNIT/ML IJ SOLN
INTRAMUSCULAR | Status: AC
  Filled 2020-07-17: qty 1

## 2020-07-17 MED ORDER — SODIUM CHLORIDE 0.9% FLUSH: 3.0000 mL | INTRAVENOUS | Status: DC | PRN

## 2020-07-17 MED ORDER — VERAPAMIL HCL 2.5 MG/ML IV SOLN
INTRAVENOUS | Status: DC | PRN
  Administered 2020-07-17: 10 mL via INTRA_ARTERIAL

## 2020-07-17 MED ORDER — GABAPENTIN 300 MG PO CAPS: 300.0000 mg | ORAL_CAPSULE | Freq: Two times a day (BID) | ORAL | 0 refills | Status: DC

## 2020-07-17 MED ORDER — SODIUM CHLORIDE 0.9 % IV SOLN: 250.0000 mL | INTRAVENOUS | Status: DC | PRN

## 2020-07-17 MED ORDER — MIDAZOLAM HCL 2 MG/2ML IJ SOLN
INTRAMUSCULAR | Status: AC
  Filled 2020-07-17: qty 2

## 2020-07-17 MED ORDER — ZOLPIDEM TARTRATE 5 MG PO TABS
5.0000 mg | ORAL_TABLET | Freq: Once | ORAL | Status: AC
  Administered 2020-07-17: 5 mg via ORAL
  Filled 2020-07-17: qty 1

## 2020-07-17 MED ORDER — LIDOCAINE HCL (PF) 1 % IJ SOLN
INTRAMUSCULAR | Status: DC | PRN
  Administered 2020-07-17: 4 mL

## 2020-07-17 SURGICAL SUPPLY — 13 items
CATH OPTITORQUE TIG 4.0 5F (CATHETERS) ×1 IMPLANT
CATH VISTA GUIDE 6FR XB3.5 (CATHETERS) ×1 IMPLANT
DEVICE RAD COMP TR BAND LRG (VASCULAR PRODUCTS) ×1 IMPLANT
GLIDESHEATH SLEND SS 6F .021 (SHEATH) ×1 IMPLANT
GUIDEWIRE INQWIRE 1.5J.035X260 (WIRE) IMPLANT
GUIDEWIRE PRESSURE COMET II (WIRE) ×1 IMPLANT
INQWIRE 1.5J .035X260CM (WIRE) ×2
KIT ENCORE 26 ADVANTAGE (KITS) ×1 IMPLANT
KIT HEART LEFT (KITS) ×2 IMPLANT
PACK CARDIAC CATHETERIZATION (CUSTOM PROCEDURE TRAY) ×2 IMPLANT
SHEATH PROBE COVER 6X72 (BAG) ×1 IMPLANT
TRANSDUCER W/STOPCOCK (MISCELLANEOUS) ×2 IMPLANT
TUBING CIL FLEX 10 FLL-RA (TUBING) ×2 IMPLANT

## 2020-07-17 NOTE — Care Management Important Message (Signed)
Important Message  Patient Details  Name: Cody [name] MRN: [id number] Date of Birth: [birth year]   Medicare Important Message Given:  Yes     Renie Ora 07/17/2020, 4:32 PM

## 2020-07-17 NOTE — Progress Notes (Signed)
Physical Therapy Treatment Patient Details Name: Cody [name] MRN: [id number] DOB: 1960/10/07 Today's Date: 07/17/2020    History of Present Illness 59yo male c/o chest pain for the past 2 weeks associated with nausea and parasthesias in BLEs and hands. ACS ruled out. Covid negative. Admitted for chest pain w/u. PMH PAD, HTN, cocaine abuse, hx of cardiac cath. CT: mild brain atrophy with progression. Diffuse bilateral while matter hypodensity with progression. (-) for CVA    PT Comments    Patient received in bed, RN present in room. Patient is agreeable to PT session. He reports he would like to talk with the "higher ups" about the care he has received and the way the MD just spoke to him. Rn reports patient just recently back from heart cath. He is independent with bed mobility. Transfers with min assist for boosting up to standing. Cues needed for hand placement for transfer. He is able to ambulate 20 feet with RW. Legs shaky with gait to the point of almost buckling and patient reports he is weak. No dizziness reported or lob. Patient will continue to benefit from skilled PT while here to improve strength and functional independence for return home.        Follow Up Recommendations  Outpatient PT- per his preference     Equipment Recommendations  Rolling walker with 5" wheels;Other (comment) (prefers rollator which should be fine)    Recommendations for Other Services       Precautions / Restrictions Precautions Precautions: Fall Restrictions Weight Bearing Restrictions: No    Mobility  Bed Mobility Overal bed mobility: Independent Bed Mobility: Supine to Sit;Sit to Supine     Supine to sit: Independent;HOB elevated Sit to supine: Independent   General bed mobility comments: no physical assist needed, assist for line management only  Transfers Overall transfer level: Needs assistance Equipment used: Rolling walker (2 wheeled) Transfers: Sit to/from Stand Sit to  Stand: Min assist         General transfer comment: patient trying to pull up from walker, cued to push up from bed. With one hand on bed, one on walker required min assist to come to standing  Ambulation/Gait Ambulation/Gait assistance: Min guard;Min assist Gait Distance (Feet): 20 Feet Assistive device: Rolling walker (2 wheeled) Gait Pattern/deviations: Step-to pattern;Decreased stride length;Decreased step length - right;Decreased step length - left Gait velocity: decreased   General Gait Details: Patient pushing walker too far out in front of him. Legs shaky with ambulation, amost buckling . Reports his legs are weak. Cues needed to keep RW close and use arms to assist with supporting LEs. Informed him that if he needed this much support with RW he would not do well with rollator ( which is what he wants). he claims he has used his wifes rollator and did fine at home.  Also was breathing very heavily upon return to sitting on side of bed. O2 sats at 100%.   Stairs             Wheelchair Mobility    Modified Rankin (Stroke Patients Only)       Balance Overall balance assessment: Needs assistance Sitting-balance support: Feet supported Sitting balance-Leahy Scale: Normal     Standing balance support: Bilateral upper extremity supported;During functional activity Standing balance-Leahy Scale: Fair Standing balance comment: legs shaky with ambulation to to the point of almost buckling with use of RW. Requires min assist for safety  Cognition Arousal/Alertness: Awake/alert Behavior During Therapy: WFL for tasks assessed/performed Overall Cognitive Status: Within Functional Limits for tasks assessed                                 General Comments: patient with multiple concerns regarding care. Verbalizing all of them.      Exercises      General Comments        Pertinent Vitals/Pain Pain Assessment:  Faces Faces Pain Scale: Hurts a little bit Pain Location: B hamstrings with activity Pain Descriptors / Indicators: Aching Pain Intervention(s): Limited activity within patient's tolerance;Monitored during session    Home Living                      Prior Function            PT Goals (current goals can now be found in the care plan section) Acute Rehab PT Goals Patient Stated Goal: to go home today, but he also wants other procedure to check his stomach while he is here. PT Goal Formulation: With patient Time For Goal Achievement: 07/30/20 Potential to Achieve Goals: Fair Progress towards PT goals: Progressing toward goals    Frequency    Min 3X/week      PT Plan Current plan remains appropriate    Co-evaluation              AM-PAC PT "6 Clicks" Mobility   Outcome Measure  Help needed turning from your back to your side while in a flat bed without using bedrails?: None Help needed moving from lying on your back to sitting on the side of a flat bed without using bedrails?: None Help needed moving to and from a bed to a chair (including a wheelchair)?: A Little Help needed standing up from a chair using your arms (e.g., wheelchair or bedside chair)?: A Little Help needed to walk in hospital room?: A Little Help needed climbing 3-5 steps with a railing? : A Little 6 Click Score: 20    End of Session Equipment Utilized During Treatment: Gait belt Activity Tolerance: Patient limited by fatigue Patient left: in bed;with call bell/phone within reach Nurse Communication: Mobility status;Other (comment) PT Visit Diagnosis: Unsteadiness on feet (R26.81);Muscle weakness (generalized) (M62.81);Difficulty in walking, not elsewhere classified (R26.2)     Time: 5462-7035 PT Time Calculation (min) (ACUTE ONLY): 37 min  Charges:  $Gait Training: 23-37 mins                     [employer], PT, GCS 07/17/20,2:09 PM

## 2020-07-17 NOTE — Progress Notes (Signed)
Reviewed chart, pt n/a for CR services today. Ethelda Chick CES, ACSM 12:46 PM 07/17/2020

## 2020-07-17 NOTE — Progress Notes (Signed)
PT Cancellation Note  Patient Details Name: Cody [name] MRN: [id number] DOB: 08/29/1961   Cancelled Treatment:    Reason Eval/Treat Not Completed: Patient at procedure or test/unavailable. Will re-attempt later today.    Jobe Mutch 07/17/2020, 9:08 AM

## 2020-07-17 NOTE — Interval H&P Note (Signed)
History and Physical Interval Note:  07/17/2020 8:54 AM  Cody [name]  has presented today for surgery, with the diagnosis of unstable angina and known CAD.  The various methods of treatment have been discussed with the patient and family. After consideration of risks, benefits and other options for treatment, the patient has consented to  Procedure(s): LEFT HEART CATH AND CORONARY ANGIOGRAPHY (N/A)  PERCUTANEOUS CORONARY INTERVENTION  as a surgical intervention.  The patient's history has been reviewed, patient examined, no change in status, stable for surgery.  I have reviewed the patient's chart and labs.  Questions were answered to the patient's satisfaction.    Cath Lab Visit (complete for each Cath Lab visit)  Clinical Evaluation Leading to the Procedure:   ACS: Yes.    Non-ACS:    Anginal Classification: CCS III  Anti-ischemic medical therapy: Maximal Therapy (2 or more classes of medications)  Non-Invasive Test Results: No non-invasive testing performed  Prior CABG: No previous CABG   Bryan Lemma

## 2020-07-17 NOTE — TOC Transition Note (Signed)
Transition of Care [facility]) - CM/SW Discharge Note   Patient Details  Name: Mina Carlisi MRN: [id number] Date of Birth: May 29, 1961  [facility] CM/SW Contact:  Bess Kinds, RN Phone Number: [phone] 07/17/2020, 3:05 PM   Clinical Narrative:     Spoke with patient at the bedside to discuss plans to transition home. Patient preference for therapy was for outpatient rehab in [place] at [facility] Location. Patient was aware of the [facility] in [place] location, but stated that he preferred to come to [place]. Patient requesting rollator. DME order placed and referral sent to [employer] who will have walker shipped to his home. Patient states that he has transportation home. Patient requesting phone number to call and talk to someone about his hospital experience. Provided contact number for office of patient experience. No further TOC needs identified.   Final next level of care: OP Rehab Barriers to Discharge: No Barriers Identified   Patient Goals and CMS Choice Patient states their goals for this hospitalization and ongoing recovery are:: home with outpatient therapy per his preference CMS Medicare.gov Compare Post Acute Care list provided to:: Patient Choice offered to / list presented to : Patient  Discharge Placement                       Discharge Plan and Services   Discharge Planning Services: CM Consult Post Acute Care Choice: Durable Medical Equipment          DME Arranged: Walker rolling with seat DME Agency: [employer] Date DME Agency Contacted: 07/17/20 Time DME Agency Contacted: [id number] Representative spoke with at DME Agency: Velna Hatchet HH Arranged: Patient Refused HH HH Agency: NA        Social Determinants of Health (SDOH) Interventions     Readmission Risk Interventions No flowsheet data found.

## 2020-07-17 NOTE — Progress Notes (Signed)
I was asked to speak with patient by the team related to several concerns he has with his care. Cody [name] shared many concerns regarding communication and unresolved clinical issues from his perspective.  I have offered support and explained that at this time he is deemed clinically ready to discharge.  He then shares that he is Cody [name], has weakness in his legs, and wants to have his bleeding ulcers evaluated tomorrow and then go home.    Myself and team have made contact with Dr. Ella Jubilee & Hortencia Conradi RN. Dr. Ella Jubilee reports he had a good conversation with the patient this morning and walked through many of these concern with a plan to discharge today and at this time did not have anything else to add medically.  Hortencia Conradi RN case manager is connecting with patient to explain his rights at this time.    Jacqulyn Cane RN, BSN, CCRN

## 2020-07-17 NOTE — Discharge Summary (Signed)
Physician Discharge Summary  Ezrah Panning EHU:[id number] DOB: [birth year] DOA: 07/13/2020  PCP: Ailene Ravel, MD  Admit date: 07/13/2020 Discharge date: 07/17/2020  Admitted From: Home  Disposition:  Home   Recommendations for Outpatient Follow-up and new medication changes:  1. Follow up with Dr. Nathanial Rancher in 7 days.  2. Follow up as outpatient with ophthalmology 3. Follow with Neurology as outpatient.   4. Increased dose of atorvastatin to 80 mg  Home Health: no (he prefer ambulatory PT)  Equipment/Devices: walker and bedside commode  Discharge Condition: stable  CODE STATUS: full  Diet recommendation: heart healthy   Brief/Interim Summary: Mr. Cobern was admitted with a working diagnosis of chest pain,which hasruledout forute coronary syndrome.Persistent chest pain.  59 year old male with past medical history for cocaine abuse, coronary disease status post angioplasty(RCA and LAD) in2013, and non obstructive coronary disease in2021, hypertension, dyslipidemia and peripheral vascular disease. Patient reported precordial chest pain, intermittent for about 2 weeks, painwas associated with nausea and paresthesias lower extremities and hands. On his initial physical examination blood pressure 165/96, heart rate 61, temperature 99.1, respiratory 21, oxygen saturation 99%. His lungs were clear to auscultation bilaterally, heart S1-S2, present rhythm, soft abdomen, no lower extremity edema. Sodium 141, potassium 3.1, chloride 104, bicarb 25, glucose 86, BUN 13, creatinine 1.0, troponin I 26-23-21, white count 9.8, hemoglobin 12.7, hematocrit 38.6, platelets 270. SARS COVID-19 negative. Chest radiograph no infiltrates. EKG 60 bpm, normal axis, normal intervals, sinus rhythm, no ST segment changes, negative T waves lead II, III and aVF.  Patient has ruled out for acute coronary syndrome, but continue to complain of chest pain and dyspepsia.  Placed on antiacid therapy  regimen.Patient with no frank angina, symptoms seem to be more gastrointestinal in origin.   Continue to have multiple complains, with photophobia and left eye pain. Has worked with speech therapy and physical therapy.    1.  Chest pain, coronary artery disease, dyslipidemia, cocaine use.  Rule out for acute coronary syndrome, further work-up with echocardiography showed a preserved LV systolic function, no wall motion normalities, no pericardial effusion.  His initial in verted T waves resolved.  Coronary angiography with stable three-vessel coronary disease, patent RCA stents and minimal diffuse disease in the RCA, stable patent LAD stent.  His urine drug screen was positive for cocaine. He continue medical therapy with aspirin, atorvastatin (incrased dose to 80 mg)/ ezetimibe and ranolazine.   2.  Peptic ulcer disease, patient was placed on aggressive antiacid therapy with pantoprazole and sucralfate.  3.  Hypertension.  Continue blood pressure control with carvedilol, amlodipine and clonidine.  4.  Peripheral artery disease.  Continue aspirin and statin.  5.  Insomnia/paresthesias/difficulty speaking.  Patient received trazodone during his hospitalization.  Physical/occupational and speech therapy evaluated patient.  Further work-up with head CT no acute changes, progressive chronic microvascular ischemic disease.  Patient will have a rolling walker, bedside commode and outpatient referral for ambulatory physical therapy.  He declined home health. No swallow dysfunction.  No further testing recommended.  Patient will follow up with neurology as an outpatient.  6.  Cocaine use.  Patient was advised about avoiding illegal substances.   Discharge Diagnoses:  Principal Problem:   Chest pain Active Problems:   Coronary artery disease   HTN (hypertension)   PAD (peripheral artery disease) (HCC)   Cocaine abuse (HCC)   PUD (peptic ulcer disease)    Discharge  Instructions   Allergies as of 07/17/2020      Reactions  Plavix [clopidogrel] Other (See Comments)   Damaged left eye completely   Iodinated Diagnostic Agents Nausea And Vomiting, Other (See Comments)   Caused the patient to vomit blood- Pt reports that dye caused him to spit up blood   Other Other (See Comments)   Unnamed med for high B/P- Caused profuse sweating and affected the mind   Peanuts [peanut Oil] Hives      Medication List    TAKE these medications   amLODipine 10 MG tablet Commonly known as: NORVASC Take 1 tablet (10 mg total) by mouth daily. What changed: when to take this   aspirin EC 81 MG tablet Take 1 tablet (81 mg total) by mouth daily. What changed: when to take this   atorvastatin 80 MG tablet Commonly known as: LIPITOR Take 1 tablet (80 mg total) by mouth daily. Start taking on: July 18, 2020 What changed:   medication strength  how much to take   carvedilol 12.5 MG tablet Commonly known as: COREG Take 1 tablet (12.5 mg total) by mouth 2 (two) times daily.   cloNIDine 0.1 MG tablet Commonly known as: CATAPRES Take 1 tablet (0.1 mg total) by mouth 3 (three) times daily.   ezetimibe 10 MG tablet Commonly known as: ZETIA Take 1 tablet (10 mg total) by mouth daily. What changed: when to take this   fluticasone 50 MCG/ACT nasal spray Commonly known as: FLONASE Place 1 spray into both nostrils daily as needed for allergies or rhinitis.   gabapentin 300 MG capsule Commonly known as: NEURONTIN Take 1 capsule (300 mg total) by mouth 2 (two) times daily.   meloxicam 15 MG tablet Commonly known as: MOBIC Take 1 tablet (15 mg total) by mouth daily.   multivitamin with minerals tablet Take 1 tablet by mouth See admin instructions. [employer] ABC Plus Senior Multivitamin- Take 1 tablet by mouth once a day   nitroGLYCERIN 0.4 MG SL tablet Commonly known as: NITROSTAT Place 1 tablet (0.4 mg total) under the tongue every 5 (five)  minutes as needed for chest pain.   pantoprazole 40 MG tablet Commonly known as: PROTONIX Take 1 tablet (40 mg total) by mouth daily. What changed: when to take this   ranolazine 500 MG 12 hr tablet Commonly known as: RANEXA Take 500 mg by mouth 2 (two) times daily.   simethicone 80 MG chewable tablet Commonly known as: MYLICON Chew 2 tablets (160 mg total) by mouth 3 (three) times daily as needed for flatulence.            Durable Medical Equipment  (From admission, onward)         Start     Ordered   07/17/20 0000  For home use only DME Walker       Question:  Patient needs a walker to treat with the following condition  Answer:  Ambulatory dysfunction   07/17/20 1217   07/17/20 0000  DME Bedside commode       Comments: 3:1 bedside commode (to use as shower chair)  Question:  Patient needs a bedside commode to treat with the following condition  Answer:  Ambulatory dysfunction   07/17/20 1217          Allergies  Allergen Reactions  . Plavix [Clopidogrel] Other (See Comments)    Damaged left eye completely  . Iodinated Diagnostic Agents Nausea And Vomiting and Other (See Comments)    Caused the patient to vomit blood- Pt reports that dye caused him to spit up  blood  . Other Other (See Comments)    Unnamed med for high B/P- Caused profuse sweating and affected the mind  . Peanuts [Peanut Oil] Hives    Consultations:  Cardiology    Procedures/Studies: DG Chest 2 View  Result Date: 07/13/2020 CLINICAL DATA:  Chest pain. EXAM: CHEST - 2 VIEW COMPARISON:  March 31, 2020. FINDINGS: The heart size and mediastinal contours are within normal limits. Both lungs are clear. No pneumothorax or pleural effusion is noted. The visualized skeletal structures are unremarkable. IMPRESSION: No active cardiopulmonary disease. Electronically Signed   By: Lupita Raider M.D.   On: 07/13/2020 12:53   CT HEAD WO CONTRAST  Result Date: 07/16/2020 CLINICAL DATA:  Dizziness EXAM:  CT HEAD WITHOUT CONTRAST TECHNIQUE: Contiguous axial images were obtained from the base of the skull through the vertex without intravenous contrast. COMPARISON:  CT head 03/10/2018 FINDINGS: Brain: Mild atrophy with progression. Diffuse bilateral white matter hypodensity also with progression. Negative for acute infarct, hemorrhage, mass Vascular: Negative for hyperdense vessel Skull: Negative Sinuses/Orbits: Paranasal sinuses clear.  Negative orbit. Other: None IMPRESSION: Progressive chronic microvascular ischemic change which is now extensive. Mild atrophy also with progression No acute abnormality. Electronically Signed   By: Marlan Palau M.D.   On: 07/16/2020 13:54   ECHOCARDIOGRAM LIMITED  Result Date: 07/14/2020    ECHOCARDIOGRAM LIMITED REPORT   Patient Name:   CARLITO BOGERT Date of Exam: 07/14/2020 Medical Rec #:  [id number]      Height:       66.0 in Accession #:    [id number]     Weight:       170.3 lb Date of Birth:  March 10, 1961       BSA:          1.868 m Patient Age:    59 years       BP:           135/87 mmHg Patient Gender: M              HR:           68 bpm. Exam Location:  Inpatient Procedure: Limited Echo, Limited Color Doppler, Cardiac Doppler, Strain Analysis            and 3D Echo Indications:    Chest Pain 786.50 / R07.9  History:        Patient has prior history of Echocardiogram examinations, most                 recent 12/29/2019. CAD, PAD; Risk Factors:Hypertension,                 Dyslipidemia and Current Smoker. Substance abuse. Chest pain                 increases with holding breath.  Sonographer:    Leta Jungling RDCS Referring Phys: [phone] PHILIP J NAHSER IMPRESSIONS  1. Left ventricular ejection fraction, by estimation, is 55 to 60%. The left ventricle has normal function. The left ventricle has no regional wall motion abnormalities. There is moderate concentric left ventricular hypertrophy. Left ventricular diastolic parameters are consistent with Grade II diastolic dysfunction  (pseudonormalization). The average left ventricular global longitudinal strain is -17.3 %. The global longitudinal strain is normal.  2. Right ventricular systolic function is normal. The right ventricular size is normal.  3. The mitral valve is normal in structure. No evidence of mitral valve regurgitation. No evidence of mitral stenosis.  4. The aortic valve is  tricuspid. Aortic valve regurgitation is not visualized. No aortic stenosis is present.  5. The inferior vena cava is normal in size with greater than 50% respiratory variability, suggesting right atrial pressure of 3 mmHg. FINDINGS  Left Ventricle: Left ventricular ejection fraction, by estimation, is 55 to 60%. The left ventricle has normal function. The left ventricle has no regional wall motion abnormalities. The average left ventricular global longitudinal strain is -17.3 %. The global longitudinal strain is normal. The left ventricular internal cavity size was normal in size. There is moderate concentric left ventricular hypertrophy. Left ventricular diastolic parameters are consistent with Grade II diastolic dysfunction (pseudonormalization). Indeterminate filling pressures. Right Ventricle: The right ventricular size is normal. No increase in right ventricular wall thickness. Right ventricular systolic function is normal. Left Atrium: Left atrial size was normal in size. Right Atrium: Right atrial size was normal in size. Pericardium: There is no evidence of pericardial effusion. Mitral Valve: The mitral valve is normal in structure. No evidence of mitral valve stenosis. Tricuspid Valve: The tricuspid valve is normal in structure. Tricuspid valve regurgitation is not demonstrated. No evidence of tricuspid stenosis. Aortic Valve: The aortic valve is tricuspid. Aortic valve regurgitation is not visualized. No aortic stenosis is present. Pulmonic Valve: The pulmonic valve was normal in structure. Pulmonic valve regurgitation is trivial. No evidence of  pulmonic stenosis. Aorta: The aortic root is normal in size and structure. Venous: The inferior vena cava is normal in size with greater than 50% respiratory variability, suggesting right atrial pressure of 3 mmHg. IAS/Shunts: No atrial level shunt detected by color flow Doppler. LEFT VENTRICLE PLAX 2D LVIDd:         4.70 cm Diastology LVIDs:         3.60 cm LV e' medial:    5.18 cm/s LV PW:         1.30 cm LV E/e' medial:  13.7 LV IVS:        1.40 cm LV e' lateral:   7.20 cm/s                        LV E/e' lateral: 9.9                         2D Longitudinal Strain                        2D Strain GLS (A2C):   -19.5 %                        2D Strain GLS (A3C):   -16.6 %                        2D Strain GLS (A4C):   -15.8 %                        2D Strain GLS Avg:     -17.3 %                         3D Volume EF:                        3D EF:        49 %  LV EDV:       154 ml                        LV ESV:       79 ml                        LV SV:        75 ml LEFT ATRIUM         Index LA diam:    3.90 cm 2.09 cm/m  AORTIC VALVE LVOT Vmax:   85.30 cm/s LVOT Vmean:  59.200 cm/s LVOT VTI:    0.187 m MITRAL VALVE MV Area (PHT): 3.76 cm    SHUNTS MV Decel Time: 202 msec    Systemic VTI: 0.19 m MV E velocity: 71.00 cm/s MV A velocity: 48.70 cm/s MV E/A ratio:  1.46 Chilton Si MD Electronically signed by Chilton Si MD Signature Date/Time: 07/14/2020/1:16:02 PM    Final       Procedures: cardiac cath  Subjective: Patient with no further chest pain, no dyspnea, no nausea or vomiting, no eye pain.  Discharge Exam: Vitals:   07/16/20 2019 07/16/20 2126  BP: (!) 135/94 (!) 135/93  Pulse: 62   Resp: 16   Temp: 98.6 F (37 C)   SpO2: 100%    Vitals:   07/16/20 1100 07/16/20 1356 07/16/20 2019 07/16/20 2126  BP: (!) 143/94 130/88 (!) 135/94 (!) 135/93  Pulse: 68 (!) 59 62   Resp: Temp: 98.1 F (36.7 C) 98.2 F (36.8 C) 98.6 F (37 C)   TempSrc: Oral  Oral Oral   SpO2: 96% 97% 100%   Weight:      Height:        General: Not in pain or dyspnea.  Neurology: Awake and alert, non focal  E ENT: no pallor, no icterus, oral mucosa moist Cardiovascular: No JVD. S1-S2 present, rhythmic, no gallops, rubs, or murmurs. No lower extremity edema. Pulmonary: vesicular breath sounds bilaterally, no wheezing, rhonchi or rales. Gastrointestinal. Abdomen soft and non tender Skin. No rashes Musculoskeletal: no joint deformities   The results of significant diagnostics from this hospitalization (including imaging, microbiology, ancillary and laboratory) are listed below for reference.     Microbiology: Recent Results (from the past 240 hour(s))  Respiratory Panel by RT PCR (Flu A&B, Covid) - Nasopharyngeal Swab     Status: None   Collection Time: 07/13/20  5:37 PM   Specimen: Nasopharyngeal Swab  Result Value Ref Range Status   SARS Coronavirus 2 by RT PCR NEGATIVE NEGATIVE Final    Comment: (NOTE) SARS-CoV-2 target nucleic acids are NOT DETECTED.  The SARS-CoV-2 RNA is generally detectable in upper respiratoy specimens during the acute phase of infection. The lowest concentration of SARS-CoV-2 viral copies this assay can detect is 131 copies/mL. A negative result does not preclude SARS-Cov-2 infection and should not be used as the sole basis for treatment or other patient management decisions. A negative result may occur with  improper specimen collection/handling, submission of specimen other than nasopharyngeal swab, presence of viral mutation(s) within the areas targeted by this assay, and inadequate number of viral copies (<131 copies/mL). A negative result must be combined with clinical observations, patient history, and epidemiological information. The expected result is Negative.  Fact Sheet for Patients:  https://www.moore.com/  Fact Sheet for Healthcare Providers:   https://www.young.biz/  This test is no t yet approved  or cleared by the [place] and  has been authorized for detection and/or diagnosis of SARS-CoV-2 by FDA under an Emergency Use Authorization (EUA). This EUA will remain  in effect (meaning this test can be used) for the duration of the COVID-19 declaration under Section 564(b)(1) of the Act, 21 U.S.C. section 360bbb-3(b)(1), unless the authorization is terminated or revoked sooner.     Influenza A by PCR NEGATIVE NEGATIVE Final   Influenza B by PCR NEGATIVE NEGATIVE Final    Comment: (NOTE) The Xpert Xpress SARS-CoV-2/FLU/RSV assay is intended as an aid in  the diagnosis of influenza from Nasopharyngeal swab specimens and  should not be used as a sole basis for treatment. Nasal washings and  aspirates are unacceptable for Xpert Xpress SARS-CoV-2/FLU/RSV  testing.  Fact Sheet for Patients: https://www.moore.com/  Fact Sheet for Healthcare Providers: https://www.young.biz/  This test is not yet approved or cleared by the [place] FDA and  has been authorized for detection and/or diagnosis of SARS-CoV-2 by  FDA under an Emergency Use Authorization (EUA). This EUA will remain  in effect (meaning this test can be used) for the duration of the  Covid-19 declaration under Section 564(b)(1) of the Act, 21  U.S.C. section 360bbb-3(b)(1), unless the authorization is  terminated or revoked. Performed at [facility], [address], [place], [place] [address]      Labs: BNP (last 3 results) No results for input(s): BNP in the last 8760 hours. Basic Metabolic Panel: Recent Labs  Lab 07/13/20 1359 07/14/20 0209 07/16/20 1558  NA 141 140 142  K 3.1* 3.9 4.0  CL 104 102 108  CO2 25 24 27   GLUCOSE 86 72 84  BUN 13 18 7   CREATININE 1.07 1.08 0.99  CALCIUM 9.2 8.9 9.2  MG  --  2.6*  --    Liver Function Tests: No results for input(s): AST, ALT,  ALKPHOS, BILITOT, PROT, ALBUMIN in the last 168 hours. No results for input(s): LIPASE, AMYLASE in the last 168 hours. No results for input(s): AMMONIA in the last 168 hours. CBC: Recent Labs  Lab 07/13/20 1359 07/14/20 0209  WBC 9.8 8.6  NEUTROABS 4.7  --   HGB 12.7* 12.3*  HCT 38.6* 37.9*  MCV 88.7 89.2  PLT 270 268   Cardiac Enzymes: No results for input(s): CKTOTAL, CKMB, CKMBINDEX, TROPONINI in the last 168 hours. BNP: Invalid input(s): POCBNP CBG: No results for input(s): GLUCAP in the last 168 hours. D-Dimer No results for input(s): DDIMER in the last 72 hours. Hgb A1c No results for input(s): HGBA1C in the last 72 hours. Lipid Profile Recent Labs    07/16/20 1200  CHOL 117  HDL 28*  LDLCALC 73  TRIG 79  CHOLHDL 4.2   Thyroid function studies No results for input(s): TSH, T4TOTAL, T3FREE, THYROIDAB in the last 72 hours.  Invalid input(s): FREET3 Anemia work up No results for input(s): VITAMINB12, FOLATE, FERRITIN, TIBC, IRON, RETICCTPCT in the last 72 hours. Urinalysis    Component Value Date/Time   COLORURINE YELLOW 03/31/2020 0925   APPEARANCEUR CLEAR 03/31/2020 0925   LABSPEC 1.020 03/31/2020 0925   PHURINE 6.0 03/31/2020 0925   GLUCOSEU 50 (A) 03/31/2020 0925   HGBUR NEGATIVE 03/31/2020 0925   BILIRUBINUR NEGATIVE 03/31/2020 0925   KETONESUR NEGATIVE 03/31/2020 0925   PROTEINUR NEGATIVE 03/31/2020 0925   UROBILINOGEN 1.0 09/20/2012 1838   NITRITE NEGATIVE 03/31/2020 0925   LEUKOCYTESUR NEGATIVE 03/31/2020 0925   Sepsis Labs Invalid input(s): PROCALCITONIN,  WBC,  LACTICIDVEN Microbiology Recent Results (from the past 240 hour(s))  Respiratory Panel by RT PCR (Flu A&B, Covid) - Nasopharyngeal Swab     Status: None   Collection Time: 07/13/20  5:37 PM   Specimen: Nasopharyngeal Swab  Result Value Ref Range Status   SARS Coronavirus 2 by RT PCR NEGATIVE NEGATIVE Final    Comment: (NOTE) SARS-CoV-2 target nucleic acids are NOT DETECTED.  The  SARS-CoV-2 RNA is generally detectable in upper respiratoy specimens during the acute phase of infection. The lowest concentration of SARS-CoV-2 viral copies this assay can detect is 131 copies/mL. A negative result does not preclude SARS-Cov-2 infection and should not be used as the sole basis for treatment or other patient management decisions. A negative result may occur with  improper specimen collection/handling, submission of specimen other than nasopharyngeal swab, presence of viral mutation(s) within the areas targeted by this assay, and inadequate number of viral copies (<131 copies/mL). A negative result must be combined with clinical observations, patient history, and epidemiological information. The expected result is Negative.  Fact Sheet for Patients:  https://www.moore.com/https://www.fda.gov/media/142436/download  Fact Sheet for Healthcare Providers:  https://www.young.biz/https://www.fda.gov/media/142435/download  This test is no t yet approved or cleared by the [place] FDA and  has been authorized for detection and/or diagnosis of SARS-CoV-2 by FDA under an Emergency Use Authorization (EUA). This EUA will remain  in effect (meaning this test can be used) for the duration of the COVID-19 declaration under Section 564(b)(1) of the Act, 21 U.S.C. section 360bbb-3(b)(1), unless the authorization is terminated or revoked sooner.     Influenza A by PCR NEGATIVE NEGATIVE Final   Influenza B by PCR NEGATIVE NEGATIVE Final    Comment: (NOTE) The Xpert Xpress SARS-CoV-2/FLU/RSV assay is intended as an aid in  the diagnosis of influenza from Nasopharyngeal swab specimens and  should not be used as a sole basis for treatment. Nasal washings and  aspirates are unacceptable for Xpert Xpress SARS-CoV-2/FLU/RSV  testing.  Fact Sheet for Patients: https://www.moore.com/https://www.fda.gov/media/142436/download  Fact Sheet for Healthcare Providers: https://www.young.biz/https://www.fda.gov/media/142435/download  This test is not yet approved or cleared  by the [place] FDA and  has been authorized for detection and/or diagnosis of SARS-CoV-2 by  FDA under an Emergency Use Authorization (EUA). This EUA will remain  in effect (meaning this test can be used) for the duration of the  Covid-19 declaration under Section 564(b)(1) of the Act, 21  U.S.C. section 360bbb-3(b)(1), unless the authorization is  terminated or revoked. Performed at [facility], [address] [place], [place] [address]      Time coordinating discharge: 45 minutes  SIGNED:   Coralie KeensMauricio Daniel Shaquera Ansley, MD  [facility] 07/17/2020, 8:21 AM

## 2020-07-17 NOTE — Progress Notes (Signed)
Patient c/o 8/10 left side chest pain and also stated cannot sleep and want med to help him sleep. NTG ` sl 1 tab given, DR Joyce Gross notified ,orderd  ambien for sleep and to continue SL ntg prn for CP . Vital signs monitored,  Patient resting and calm   when left in room. Continue to monitor patient.

## 2020-07-17 NOTE — Progress Notes (Signed)
Progress Note  Patient Name: Cody [name] Date of Encounter: 07/17/2020  Primary Cardiologist: Thomasene Ripple, DO   Subjective   Transport at bedside about to take him to the cath lab. Reported more chest pain around 1am this morning. Frustrated that his cath did not occur yesterday. Asked about his Head CT results - we briefly reviewed them. He asked why he is still having trouble walking.   Inpatient Medications    Scheduled Meds: . acetaminophen  650 mg Oral Q6H  . amLODipine  10 mg Oral Daily  . aspirin EC  81 mg Oral Daily  . atorvastatin  40 mg Oral Daily  . carvedilol  12.5 mg Oral BID  . cloNIDine  0.1 mg Oral TID  . enoxaparin (LOVENOX) injection  40 mg Subcutaneous Q24H  . ezetimibe  10 mg Oral QHS  . gabapentin  300 mg Oral BID  . pantoprazole (PROTONIX) IV  40 mg Intravenous Q12H  . pneumococcal 23 valent vaccine  0.5 mL Intramuscular Tomorrow-1000  . ranolazine  500 mg Oral BID  . sodium chloride flush  3 mL Intravenous Q12H  . sucralfate  1 g Oral TID WC & HS  . traZODone  50 mg Oral QHS   Continuous Infusions: . sodium chloride    . sodium chloride 1 mL/kg/hr (07/17/20 0420)   PRN Meds: sodium chloride, nitroGLYCERIN, sodium chloride flush   Vital Signs    Vitals:   07/16/20 1356 07/16/20 2019 07/16/20 2126 07/17/20 0815  BP: 130/88 (!) 135/94 (!) 135/93 127/83  Pulse: (!) 59 62  61  Resp: 18 16  16   Temp: 98.2 F (36.8 C) 98.6 F (37 C)  98.1 F (36.7 C)  TempSrc: Oral Oral  Oral  SpO2: 97% 100%  99%  Weight:      Height:        Intake/Output Summary (Last 24 hours) at 07/17/2020 0842 Last data filed at 07/17/2020 0800 Gross per 24 hour  Intake 767.73 ml  Output 3100 ml  Net -2332.27 ml   Filed Weights   07/14/20 0702 07/15/20 0500 07/16/20 0338  Weight: 77.2 kg 79.6 kg 79.8 kg    Telemetry    Sinus rhythm/sinus bradycardia - Personally Reviewed  ECG    No new tracings - Personally Reviewed  Physical Exam   GEN: No acute  distress.   Neck: No JVD, no carotid bruits Cardiac: RRR, no murmurs, rubs, or gallops.  Respiratory: Clear to auscultation bilaterally, no wheezes/ rales/ rhonchi GI: NABS, Soft, nontender, non-distended  MS: No edema; No deformity. Neuro:  Nonfocal, moving all extremities spontaneously Psych: Normal affect   Labs    Chemistry Recent Labs  Lab 07/13/20 1359 07/14/20 0209 07/16/20 1558  NA 141 140 142  K 3.1* 3.9 4.0  CL 104 102 108  CO2 25 24 27   GLUCOSE 86 72 84  BUN 13 18 7   CREATININE 1.07 1.08 0.99  CALCIUM 9.2 8.9 9.2  GFRNONAA >60 >60 >60  ANIONGAP 12 14 7      Hematology Recent Labs  Lab 07/13/20 1359 07/14/20 0209  WBC 9.8 8.6  RBC 4.35 4.25  HGB 12.7* 12.3*  HCT 38.6* 37.9*  MCV 88.7 89.2  MCH 29.2 28.9  MCHC 32.9 32.5  RDW 12.7 12.8  PLT 270 268    Cardiac EnzymesNo results for input(s): TROPONINI in the last 168 hours. No results for input(s): TROPIPOC in the last 168 hours.   BNPNo results for input(s): BNP, PROBNP in the last  168 hours.   DDimer No results for input(s): DDIMER in the last 168 hours.   Radiology    CT HEAD WO CONTRAST  Result Date: 07/16/2020 CLINICAL DATA:  Dizziness EXAM: CT HEAD WITHOUT CONTRAST TECHNIQUE: Contiguous axial images were obtained from the base of the skull through the vertex without intravenous contrast. COMPARISON:  CT head 03/10/2018 FINDINGS: Brain: Mild atrophy with progression. Diffuse bilateral white matter hypodensity also with progression. Negative for acute infarct, hemorrhage, mass Vascular: Negative for hyperdense vessel Skull: Negative Sinuses/Orbits: Paranasal sinuses clear.  Negative orbit. Other: None IMPRESSION: Progressive chronic microvascular ischemic change which is now extensive. Mild atrophy also with progression No acute abnormality. Electronically Signed   By: Marlan Palau M.D.   On: 07/16/2020 13:54    Cardiac Studies   Echocardiogram 07/14/20: 1. Left ventricular ejection fraction,  by estimation, is 55 to 60%. The  left ventricle has normal function. The left ventricle has no regional  wall motion abnormalities. There is moderate concentric left ventricular  hypertrophy. Left ventricular  diastolic parameters are consistent with Grade II diastolic dysfunction  (pseudonormalization). The average left ventricular global longitudinal  strain is -17.3 %. The global longitudinal strain is normal.  2. Right ventricular systolic function is normal. The right ventricular  size is normal.  3. The mitral valve is normal in structure. No evidence of mitral valve  regurgitation. No evidence of mitral stenosis.  4. The aortic valve is tricuspid. Aortic valve regurgitation is not  visualized. No aortic stenosis is present.  5. The inferior vena cava is normal in size with greater than 50%  respiratory variability, suggesting right atrial pressure of 3 mmHg.   Patient Profile     59 y.o. male with a PMH of CAD s/p prior stenting or RCA and LAD, HTN, HLD, PAD, GERD, Tobacco abuse, and cocaine abuse who is being followed by cardiology for the evaluation of chest pain.   Assessment & Plan    1. Chest pain in patient with known CAD s/p stenting to RCA and LAD: patent stents without restenosis on last [facility] 03/2020 with non-obstructive moderate disease in MLCx and mLAD (prior to old stent) and mild disease in RCA surrounding prior stents. He presents again with chest pain c/w prior symptoms leading up to PCI. HsTrop with low flat trend not c/w ACS. EKG is non-ischemic. Utox +cocaine. Limited echo with EF 55-60%, G2DD, no RWMA, and no significant valvular abnormalities. He is adamant that he needs another heart catheterization. Unfortunately got bumped from the schedule yesterday. - Await [facility] today - Continue aspirin, statin, and zetia - Continue ranexa  - Continue amlodipine and carvedilol  2. HTN: BP stable - Continue amlodipine, carvedilol, and clonidine  3. HLD: LDL 73 this  admission. - Will increase atorvastatin to 80mg  daily in an effort reach goal LDL<70 - Continue zetia  4. Substance abuse: ongoing tobacco and cocaine abuse (+ on Utox this admission). Cocaine use could be contributing to #1 - Continue to encrourage cessation  5. Intermittent weakness: patient describes vague intermittent weakness and fuzzy sensation in his head. He is concerned for a possible stroke. CT head with progressive chronic microvascular ischemic changes with mild atrophy, though no acute findings.  - Will defer further work-up to primary team.  - Continue aspirin and statin  For questions or updates, please contact [facility] Please consult [email or web address] for contact info under Cardiology/STEMI.      Signed, , PA-C  07/17/2020, 8:42 AM   [phone]

## 2020-07-17 NOTE — Care Management Important Message (Signed)
Important Message  Patient Details  Name: Criag Wicklund MRN: [id number] Date of Birth: August 04, 1961   Medicare Important Message Given:  Yes     Bess Kinds, RN 07/17/2020, 5:11 PM

## 2020-07-17 NOTE — Progress Notes (Signed)
OT Cancellation Note  Patient Details Name: Yuma Pacella MRN: [id number] DOB: [birth year]   Cancelled Treatment:    Reason Eval/Treat Not Completed: (P) Patient at procedure or test/ unavailable (cath). Will follow up another time.  Antonetta Clanton,HILLARY 07/17/2020, 8:52 AM  Luisa Dago, OT/L   Acute OT Clinical Specialist Acute Rehabilitation Services Pager [phone] Office [phone]

## 2020-07-18 ENCOUNTER — Encounter (HOSPITAL_COMMUNITY): Payer: Self-pay | Admitting: Internal Medicine

## 2020-07-18 DIAGNOSIS — R1013 Epigastric pain: Secondary | ICD-10-CM

## 2020-07-18 DIAGNOSIS — Z8719 Personal history of other diseases of the digestive system: Secondary | ICD-10-CM | POA: Diagnosis not present

## 2020-07-18 LAB — BASIC METABOLIC PANEL
Anion gap: 7 (ref 5–15)
BUN: 10 mg/dL (ref 6–20)
CO2: 27 mmol/L (ref 22–32)
Calcium: 9 mg/dL (ref 8.9–10.3)
Chloride: 106 mmol/L (ref 98–111)
Creatinine, Ser: 1.09 mg/dL (ref 0.61–1.24)
GFR, Estimated: >60 mL/min (ref >60)
Glucose, Bld: 161 mg/dL — ABNORMAL HIGH (ref 70–99)
Potassium: 3.9 mmol/L (ref 3.5–5.1)
Sodium: 140 mmol/L (ref 135–145)

## 2020-07-18 LAB — CBC
HCT: 34.2 % — ABNORMAL LOW (ref 39.0–52.0)
Hemoglobin: 11.6 g/dL — ABNORMAL LOW (ref 13.0–17.0)
MCH: 30.2 pg (ref 26.0–34.0)
MCHC: 33.9 g/dL (ref 30.0–36.0)
MCV: 89.1 fL (ref 80.0–100.0)
Platelets: 247 10*3/uL (ref 150–400)
RBC: 3.84 MIL/uL — ABNORMAL LOW (ref 4.22–5.81)
RDW: 12.7 % (ref 11.5–15.5)
WBC: 16.3 10*3/uL — ABNORMAL HIGH (ref 4.0–10.5)
nRBC: 0 % (ref 0.0–0.2)

## 2020-07-18 MED ORDER — FLUTICASONE PROPIONATE 50 MCG/ACT NA SUSP
1.0000 | Freq: Every day | NASAL | Status: DC | PRN
  Administered 2020-07-18: 1 via NASAL
  Filled 2020-07-18: qty 16

## 2020-07-18 MED ORDER — PANTOPRAZOLE SODIUM 40 MG PO TBEC
40.0000 mg | DELAYED_RELEASE_TABLET | Freq: Every day | ORAL | Status: DC
  Administered 2020-07-19 – 2020-07-20 (×2): 40 mg via ORAL
  Filled 2020-07-18 (×2): qty 1

## 2020-07-18 NOTE — Care Management (Signed)
Late Entry: 0930 07-18-20 Patient appealed the discharge on 07-17-20 by notifying [facility]. [facility] faxed information to the hospital regarding the appeal. Detailed Notice of Discharge and HINN 12 provided to the patient this am. Clinical information sent to [facility]. Awaiting to hear back from [place] [facility] with a determination. Case Manager also spoke with patient this am regarding transition of care needs. Patient will need a rollator for the home. Case Manager called [employer] and the rollator has been drop shipped to the patients home. [name]

## 2020-07-18 NOTE — Progress Notes (Signed)
Cath site C/D/I. Pt has follow up in [place] with Dr. Servando Salina. See Dr. Leonides Sake note yesterday.  OK to discharge from cardiology perspective. Cardiology has signed off.   Marcelino Duster, PA-C 07/18/2020, 8:09 AM [phone] [facility] [address] [place], [place] [address]

## 2020-07-18 NOTE — Anesthesia Preprocedure Evaluation (Addendum)
Anesthesia Evaluation  Patient identified by MRN, date of birth, ID band Patient awake    Reviewed: Allergy & Precautions, NPO status , Patient's Chart, lab work & pertinent test results  Airway Mallampati: II  TM Distance: >3 FB Neck ROM: Full    Dental no notable dental hx. (+) Teeth Intact, Dental Advisory Given   Pulmonary Current Smoker and Patient abstained from smoking.,    Pulmonary exam normal breath sounds clear to auscultation       Cardiovascular Exercise Tolerance: Good hypertension, + CAD and + Peripheral Vascular Disease  Normal cardiovascular exam Rhythm:Regular Rate:Normal  LHC 07/17/20:  The left ventricular systolic function is normal. The left ventricular ejection fraction is 45-50% by visual estimate.  LV end diastolic pressure is normal.  Prox RCA-1 lesion is 30% stenosed.  Prox RCA-2 STENT is 10% stenosed.  Prox RCA to Mid RCA lesion is 30% stenosed.  Mid RCA to Dist RCA STENT is 20% stenosed.  Prox LAD lesion is 50% stenosed. - FFR 0.84 (Not significant)  Prox LAD to Mid LAD STENT is 5% stenosed.  Prox Cx lesion is 70% stenosed with 30% stenosed side branch in 1st Mrg. - DFR 0.97 (Negative)   SUMMARY  Stable three-vessel CAD with patent RCA stents and minimal diffuse disease in the RCA, stable patent LAD stent with roughly 50% (FFR negative) lesion just proximal to the stent, stable roughly 70% mid LCx (DFR negative) lesion.  Poor imaging on LV gram.  EF appears to be somewhat down, but this is not consistent with echocardiogram findings.  No obvious culprit lesion to explain resting chest pain.  Recommend evaluated nonischemic/noncardiac etiology.    Neuro/Psych negative neurological ROS  negative psych ROS   GI/Hepatic PUD, GERD  ,(+)     substance abuse  cocaine use,   Endo/Other  negative endocrine ROS  Renal/GU negative Renal ROS  negative genitourinary    Musculoskeletal negative musculoskeletal ROS (+)   Abdominal Normal abdominal exam  (+)   Peds negative pediatric ROS (+)  Hematology  (+) anemia ,   Anesthesia Other Findings   Reproductive/Obstetrics negative OB ROS                           Anesthesia Physical Anesthesia Plan  ASA: III  Anesthesia Plan: MAC   Post-op Pain Management:    Induction: Intravenous  PONV Risk Score and Plan: Propofol infusion, TIVA and Treatment may vary due to age or medical condition  Airway Management Planned: Natural Airway and Nasal Cannula  Additional Equipment:   Intra-op Plan:   Post-operative Plan:   Informed Consent: I have reviewed the patients History and Physical, chart, labs and discussed the procedure including the risks, benefits and alternatives for the proposed anesthesia with the patient or authorized representative who has indicated his/her understanding and acceptance.     Dental advisory given  Plan Discussed with: Anesthesiologist and CRNA  Anesthesia Plan Comments:        Anesthesia Quick Evaluation

## 2020-07-18 NOTE — Progress Notes (Addendum)
Mr. Kulpa has been medically stable for discharge, he has ruled out for acute coronary syndrome or any acute neurologic deficit. He does not have any clinical signs of active bleeding peptic ulcer disease.  He has been getting pantoprazole and sucralfate.  He insists and having a endoscopy.  I have explained that there are no clinical signs of he needing this procedure at this time.  The plan is to have him follow-up with his primary care provider within 7 days, he also has a neurology and cardiology follow-up.  He has appealed his discharge.  I have talked to him and explain him in detail my medical rationale and all questions have been addressed.  I will grant his wishes to call gastroenterology for an inpatient opinion.

## 2020-07-18 NOTE — Consult Note (Addendum)
[facility] Consult: 11:57 AM 07/18/2020  LOS: 4 days     Referring Provider: Dr Ella Jubilee Primary Care Physician:  Ailene Ravel, MD Primary Gastroenterologist:  Gentry Fitz.       Reason for Consultation:  Pt insistence that he needs EGD   HPI: Cody [name] is a 59 y.o. male.  medical history significant for CAD, cardiac stent placement in 2013, 2015, June 2021, htn, hld, PAD  Remote EGD which patient says showed ulcers but this was possibly 20 years ago.  The patient is a rambling historian, lots of interjecting as to what the problem is and how he wants it fixed.  Feels no one is listening to him.    Admission 5 d ago w non-exertional burning chest pain.  Intermittent, progressive x 2 weeks.  Intermittent nausea and numbness in fingers.     Ruled out for ACS, neurologic issues.   Echo: LVEF 55%.  Grade 2 DD.   Cardiac cath 10/12: Stable 3 v dz. Patent stents.  No culprit lesions to explain CP.  Rec eval for non cardiac causes of pain.    Cocaine positive tox screen.   Hgb 11.6, was same in June.   WBCs to 16.3.  No LFTs.    Pt C/O burning pain in the epigastric upper abdominal area.  No nausea.  Food or lack thereof does not affect the pain.  Relieved by Tums temporarily.  Although the admission pharmacy med rec says that he is taking Protonix 40 mg at at bedtime, patient did not confirm this for me..  Denies NSAIDs or any type of pain relieving medication.  No dysphagia.  No anorexia.  No melena, no blood per rectum.  Rare alcohol.  The cocaine positive tox screen is a source of agitation for him, pt says he was at a party that had some white granulated/powdery material around a punch bowl he thought it was sugar and he thinks that it may have gotten into his bloodstream from drinking this punch.  He  also tested + for cocaine in 09/2019.  Smokes a pack of cigarettes over a period of 7 days.  Pain is not better after several d of Carafate and Protonix.  Pt insisting on EGD for eval.    Lives between [place] and [address], [place] where his wife lives.  Sees MDs in [place] and [place].      Past Medical History:  Diagnosis Date  . Cocaine abuse (HCC) 09/20/2012  . Coronary artery disease   . Dyslipidemia 09/20/2012  . GERD (gastroesophageal reflux disease)    has stomach ulcers a while back  . HTN (hypertension)   . PAD (peripheral artery disease) (HCC) 09/20/2012  . Smoker 09/20/2012    Past Surgical History:  Procedure Laterality Date  . CORONARY STENT PLACEMENT    . INTRAVASCULAR PRESSURE WIRE/FFR STUDY N/A 03/30/2020   Procedure: INTRAVASCULAR PRESSURE WIRE/FFR STUDY;  Surgeon: Kathleene Hazel, MD;  Location: [facility] INVASIVE CV LAB;  Service: Cardiovascular;  Laterality: N/A;  . INTRAVASCULAR PRESSURE WIRE/FFR STUDY N/A 07/17/2020  Procedure: INTRAVASCULAR PRESSURE WIRE/FFR STUDY;  Surgeon: Marykay Lex, MD;  Location: [facility] INVASIVE CV LAB;  Service: Cardiovascular;  Laterality: N/A;  . LEFT HEART CATH AND CORONARY ANGIOGRAPHY N/A 03/30/2020   Procedure: LEFT HEART CATH AND CORONARY ANGIOGRAPHY;  Surgeon: Kathleene Hazel, MD;  Location: [facility] INVASIVE CV LAB;  Service: Cardiovascular;  Laterality: N/A;  . LEFT HEART CATH AND CORONARY ANGIOGRAPHY N/A 07/17/2020   Procedure: LEFT HEART CATH AND CORONARY ANGIOGRAPHY;  Surgeon: Marykay Lex, MD;  Location: [facility] INVASIVE CV LAB;  Service: Cardiovascular;  Laterality: N/A;  . LEFT HEART CATHETERIZATION WITH CORONARY ANGIOGRAM N/A 09/24/2012   Procedure: LEFT HEART CATHETERIZATION WITH CORONARY ANGIOGRAM;  Surgeon: Lesleigh Noe, MD;  Location: [facility] CATH LAB;  Service: Cardiovascular;  Laterality: N/A;    Prior to Admission medications   Medication Sig Start Date End Date Taking? Authorizing Provider    amLODipine (NORVASC) 10 MG tablet Take 1 tablet (10 mg total) by mouth daily. Patient taking differently: Take 10 mg by mouth at bedtime.  04/01/20  Yes Rodolph Bong, MD  aspirin EC 81 MG tablet Take 1 tablet (81 mg total) by mouth daily. Patient taking differently: Take 81 mg by mouth at bedtime.  04/01/20  Yes Rodolph Bong, MD  atorvastatin (LIPITOR) 40 MG tablet Take 1 tablet (40 mg total) by mouth daily. Patient taking differently: Take 40 mg by mouth at bedtime.  04/01/20  Yes Rodolph Bong, MD  carvedilol (COREG) 12.5 MG tablet Take 1 tablet (12.5 mg total) by mouth 2 (two) times daily. 04/01/20  Yes Rodolph Bong, MD  cloNIDine (CATAPRES) 0.1 MG tablet Take 1 tablet (0.1 mg total) by mouth 3 (three) times daily. 04/01/20  Yes Rodolph Bong, MD  ezetimibe (ZETIA) 10 MG tablet Take 1 tablet (10 mg total) by mouth daily. Patient taking differently: Take 10 mg by mouth at bedtime.  04/11/20 07/13/20 Yes Tobb, Kardie, DO  fluticasone (FLONASE) 50 MCG/ACT nasal spray Place 1 spray into both nostrils daily as needed for allergies or rhinitis. 04/01/20  Yes Rodolph Bong, MD  Multiple Vitamins-Minerals (MULTIVITAMIN WITH MINERALS) tablet Take 1 tablet by mouth See admin instructions. [employer] ABC Plus Senior Multivitamin- Take 1 tablet by mouth once a day   Yes [provider]  nitroGLYCERIN (NITROSTAT) 0.4 MG SL tablet Place 1 tablet (0.4 mg total) under the tongue every 5 (five) minutes as needed for chest pain. 04/01/20  Yes Rodolph Bong, MD  pantoprazole (PROTONIX) 40 MG tablet Take 1 tablet (40 mg total) by mouth daily. Patient taking differently: Take 40 mg by mouth at bedtime.  04/01/20  Yes Rodolph Bong, MD  ranolazine (RANEXA) 500 MG 12 hr tablet Take 500 mg by mouth 2 (two) times daily.   Yes [provider]  simethicone (MYLICON) 80 MG chewable tablet Chew 2 tablets (160 mg total) by mouth 3 (three) times daily as needed for  flatulence. 04/01/20  Yes Rodolph Bong, MD  atorvastatin (LIPITOR) 80 MG tablet Take 1 tablet (80 mg total) by mouth daily. 07/18/20 08/17/20  Arrien, York Ram, MD  gabapentin (NEURONTIN) 300 MG capsule Take 1 capsule (300 mg total) by mouth 2 (two) times daily. 07/17/20 08/16/20  Arrien, York Ram, MD  meloxicam (MOBIC) 15 MG tablet Take 1 tablet (15 mg total) by mouth daily. 07/17/20 08/16/20  Arrien, York Ram, MD    Scheduled Meds: . acetaminophen  650 mg Oral Q6H  . amLODipine  10 mg Oral Daily  . aspirin EC  81 mg Oral Daily  . atorvastatin  80 mg Oral Daily  . carvedilol  12.5 mg Oral BID  . cloNIDine  0.1 mg Oral TID  . enoxaparin (LOVENOX) injection  40 mg Subcutaneous Q24H  . ezetimibe  10 mg Oral QHS  . gabapentin  300 mg Oral BID  . pantoprazole  40 mg Oral Daily  . ranolazine  500 mg Oral BID  . sodium chloride flush  3 mL Intravenous Q12H  . sucralfate  1 g Oral TID WC & HS  . traZODone  50 mg Oral QHS   Infusions: . sodium chloride     PRN Meds: sodium chloride, acetaminophen, fluticasone, nitroGLYCERIN, ondansetron (ZOFRAN) IV, sodium chloride flush   Allergies as of 07/13/2020 - Review Complete 07/13/2020  Allergen Reaction Noted  . Plavix [clopidogrel] Other (See Comments) 06/04/2014  . Iodinated diagnostic agents Nausea And Vomiting and Other (See Comments) 09/20/2012  . Other Other (See Comments) 07/13/2020  . Peanuts [peanut oil] Hives 09/20/2012    Family History  Problem Relation Age of Onset  . Diabetes Father   . CAD Brother   . Hypertension Mother     Social History   Socioeconomic History  . Marital status: Married    Spouse name: Not on file  . Number of children: Not on file  . Years of education: Not on file  . Highest education level: Not on file  Occupational History  . Not on file  Tobacco Use  . Smoking status: Current Every Day Smoker    Types: Cigarettes  . Smokeless tobacco: Never Used  Vaping Use    . Vaping Use: Never used  Substance and Sexual Activity  . Alcohol use: Yes    Alcohol/week: 3.0 - 4.0 standard drinks    Types: 2 Cans of beer, 1 - 2 Shots of liquor per week  . Drug use: Yes    Types: Cocaine  . Sexual activity: Not on file  Other Topics Concern  . Not on file  Social History Narrative  . Not on file   Social Determinants of Health   Financial Resource Strain:   . Difficulty of Paying Living Expenses: Not on file  Food Insecurity:   . Worried About Programme researcher, broadcasting/film/video in the Last Year: Not on file  . Ran Out of Food in the Last Year: Not on file  Transportation Needs:   . Lack of Transportation (Medical): Not on file  . Lack of Transportation (Non-Medical): Not on file  Physical Activity:   . Days of Exercise per Week: Not on file  . Minutes of Exercise per Session: Not on file  Stress:   . Feeling of Stress : Not on file  Social Connections:   . Frequency of Communication with Friends and Family: Not on file  . Frequency of Social Gatherings with Friends and Family: Not on file  . Attends Religious Services: Not on file  . Active Member of Clubs or Organizations: Not on file  . Attends Banker Meetings: Not on file  . Marital Status: Not on file  Intimate Partner Violence:   . Fear of Current or Ex-Partner: Not on file  . Emotionally Abused: Not on file  . Physically Abused: Not on file  . Sexually Abused: Not on file    REVIEW OF SYSTEMS: Constitutional: No weakness, no fatigue ENT:  No nose bleeds Pulm: No cough, no shortness of breath CV:  No palpitations, no LE edema.  GU:  No hematuria, no frequency GI: See HPI Heme: No history excessive or unusual bleeding or bruising.  No history of anemia. Transfusions: No previous blood transfusions. Neuro:  No headaches, no peripheral tingling or numbness.  No syncope, no seizures. Derm:  No itching, no rash or sores.  Endocrine:  No sweats or chills.  No polyuria or  dysuria Immunization: Vaccinated for COVID-19 and several other vaccinations including Pneumovax and flu. Travel:  None beyond local counties in last few months.    PHYSICAL EXAM: Vital signs in last 24 hours: Vitals:   07/18/20 0500 07/18/20 0814  BP: (!) 173/96 (!) 154/83  Pulse: 79 71  Resp: 18 20  Temp: 99 F (37.2 C) 98.4 F (36.9 C)  SpO2: 99% 98%   Wt Readings from Last 3 Encounters:  07/18/20 81.2 kg  04/11/20 78.7 kg  03/29/20 79.2 kg    General: Well-appearing.  Pressured speech Head: No facial asymmetry or signs of head trauma. Eyes: No scleral icterus.  No conjunctival pallor. Ears: No hearing deficit Nose: No congestion or discharge Mouth: Tongue midline.  Mucosa moist, pink, clear.  Tongue midline Neck: No JVD, no masses, no thyromegaly Lungs: Reduced breath sounds but clear bilaterally.  No dyspnea or cough Heart: RRR.  No MRG.  S1, S2 present Abdomen: Soft, not tender.  No masses, bruits, hernias.  Bowel sounds active.   Rectal: Deferred Musc/Skeltl:'s Extremities: No CCE. Neurologic: Oriented x3.  No tremor.  No gross deficits or weakness. Skin: No rash, no sores   Psych: Venting his frustration and anger at perceived lack of sympathetic medical care  Intake/Output from previous day: 10/12 0701 - 10/13 0700 In: 2295 [P.O.:716; I.V.:1579] Out: 3200 [Urine:3200] Intake/Output this shift: No intake/output data recorded.  LAB RESULTS: Recent Labs    07/18/20 0454  WBC 16.3*  HGB 11.6*  HCT 34.2*  PLT 247   BMET Lab Results  Component Value Date   NA 140 07/18/2020   NA 142 07/16/2020   NA 140 07/14/2020   K 3.9 07/18/2020   K 4.0 07/16/2020   K 3.9 07/14/2020   CL 106 07/18/2020   CL 108 07/16/2020   CL 102 07/14/2020   CO2 27 07/18/2020   CO2 27 07/16/2020   CO2 24 07/14/2020   GLUCOSE 161 (H) 07/18/2020   GLUCOSE 84 07/16/2020   GLUCOSE 72 07/14/2020   BUN 10 07/18/2020   BUN 7 07/16/2020   BUN 18 07/14/2020   CREATININE 1.09  07/18/2020   CREATININE 0.99 07/16/2020   CREATININE 1.08 07/14/2020   CALCIUM 9.0 07/18/2020   CALCIUM 9.2 07/16/2020   CALCIUM 8.9 07/14/2020   LFT No results for input(s): PROT, ALBUMIN, AST, ALT, ALKPHOS, BILITOT, BILIDIR, IBILI in the last 72 hours. PT/INR Lab Results  Component Value Date   INR 1.09 09/20/2012   Hepatitis Panel No results for input(s): HEPBSAG, HCVAB, HEPAIGM, HEPBIGM in the last 72 hours. C-Diff No components found for: CDIFF Lipase  No results found for: LIPASE  Drugs of Abuse     Component Value Date/Time   LABOPIA NONE DETECTED 07/14/2020 0955   COCAINSCRNUR POSITIVE (A) 07/14/2020 0955   LABBENZ NONE DETECTED 07/14/2020 0955   AMPHETMU NONE DETECTED 07/14/2020 0955   THCU NONE DETECTED 07/14/2020 0955   LABBARB NONE DETECTED 07/14/2020 0955     RADIOLOGY STUDIES: CT HEAD WO CONTRAST  Result Date: 07/16/2020 CLINICAL DATA:  Dizziness EXAM: CT HEAD WITHOUT CONTRAST TECHNIQUE: Contiguous  axial images were obtained from the base of the skull through the vertex without intravenous contrast. COMPARISON:  CT head 03/10/2018 FINDINGS: Brain: Mild atrophy with progression. Diffuse bilateral white matter hypodensity also with progression. Negative for acute infarct, hemorrhage, mass Vascular: Negative for hyperdense vessel Skull: Negative Sinuses/Orbits: Paranasal sinuses clear.  Negative orbit. Other: None IMPRESSION: Progressive chronic microvascular ischemic change which is now extensive. Mild atrophy also with progression No acute abnormality. Electronically Signed   By: Marlan Palau M.D.   On: 07/16/2020 13:54   CARDIAC CATHETERIZATION  Result Date: 07/17/2020  The left ventricular systolic function is normal. The left ventricular ejection fraction is 45-50% by visual estimate.  LV end diastolic pressure is normal.  Prox RCA-1 lesion is 30% stenosed.  Prox RCA-2 STENT is 10% stenosed.  Prox RCA to Mid RCA lesion is 30% stenosed.  Mid RCA to Dist  RCA STENT is 20% stenosed.  Prox LAD lesion is 50% stenosed. - FFR 0.84 (Not significant)  Prox LAD to Mid LAD STENT is 5% stenosed.  Prox Cx lesion is 70% stenosed with 30% stenosed side branch in 1st Mrg. - DFR 0.97 (Negative)  SUMMARY  Stable three-vessel CAD with patent RCA stents and minimal diffuse disease in the RCA, stable patent LAD stent with roughly 50% (FFR negative) lesion just proximal to the stent, stable roughly 70% mid LCx (DFR negative) lesion.  Poor imaging on LV gram.  EF appears to be somewhat down, but this is not consistent with echocardiogram findings.  No obvious culprit lesion to explain resting chest pain.  Recommend evaluated nonischemic/noncardiac etiology. RECOMMENDATIONS  Return to nursing unit for ongoing care.  Continue aggressive medical management.  Considered nonanginal cause for chest pain that was resting upon arrival to the Cath Lab.  Although angiographically the most significant potential lesion is the LCx lesion this was actually grossly normal by DFR. Bryan Lemma, MD    IMPRESSION:   *    Epigastric burning.  Persists despite inpt Protonix and Carafate.  Patient reported remote hx of peptic ulcer disease.  TUMS only as outpt provides short-lived relief.   Rule out PUD, rule out GERD.  Rule out functional discomfort.      PLAN:     *    Dr. Rhea Belton will see the pt.  In the meantime went ahead and scheduled EGD for tomorrow as he is not n.p.o. now we will need to wait.   Jennye Moccasin  07/18/2020, 11:57 AM Phone [phone]

## 2020-07-18 NOTE — H&P (View-Only) (Signed)
                                                                           [facility] Consult: 11:57 AM 07/18/2020  LOS: 4 days     Referring Provider: Dr Arrien Primary Care Physician:  Hamrick, Maura L, MD Primary Gastroenterologist:  Unassigned.       Reason for Consultation:  Pt insistence that he needs EGD   HPI: Cody [name] is a 59 y.o. male.  medical history significant for CAD, cardiac stent placement in 2013, 2015, June 2021, htn, hld, PAD  Remote EGD which patient says showed ulcers but this was possibly 20 years ago.  The patient is a rambling historian, lots of interjecting as to what the problem is and how he wants it fixed.  Feels no one is listening to him.    Admission 5 d ago w non-exertional burning chest pain.  Intermittent, progressive x 2 weeks.  Intermittent nausea and numbness in fingers.     Ruled out for ACS, neurologic issues.   Echo: LVEF 55%.  Grade 2 DD.   Cardiac cath 10/12: Stable 3 v dz. Patent stents.  No culprit lesions to explain CP.  Rec eval for non cardiac causes of pain.    Cocaine positive tox screen.   Hgb 11.6, was same in June.   WBCs to 16.3.  No LFTs.    Pt C/O burning pain in the epigastric upper abdominal area.  No nausea.  Food or lack thereof does not affect the pain.  Relieved by Tums temporarily.  Although the admission pharmacy med rec says that he is taking Protonix 40 mg at at bedtime, patient did not confirm this for me..  Denies NSAIDs or any type of pain relieving medication.  No dysphagia.  No anorexia.  No melena, no blood per rectum.  Rare alcohol.  The cocaine positive tox screen is a source of agitation for him, pt says he was at a party that had some white granulated/powdery material around a punch bowl he thought it was sugar and he thinks that it may have gotten into his bloodstream from drinking this punch.  He  also tested + for cocaine in 09/2019.  Smokes a pack of cigarettes over a period of 7 days.  Pain is not better after several d of Carafate and Protonix.  Pt insisting on EGD for eval.    Lives between [place] and [place],  where his wife lives.  Sees MDs in [place] and [place].      Past Medical History:  Diagnosis Date  . Cocaine abuse (HCC) 09/20/2012  . Coronary artery disease   . Dyslipidemia 09/20/2012  . GERD (gastroesophageal reflux disease)    has stomach ulcers a while back  . HTN (hypertension)   . PAD (peripheral artery disease) (HCC) 09/20/2012  . Smoker 09/20/2012    Past Surgical History:  Procedure Laterality Date  . CORONARY STENT PLACEMENT    . INTRAVASCULAR PRESSURE WIRE/FFR STUDY N/A 03/30/2020   Procedure: INTRAVASCULAR PRESSURE WIRE/FFR STUDY;  Surgeon: McAlhany, Christopher D, MD;  Location: [facility];  Service: Cardiovascular;  Laterality: N/A;  . INTRAVASCULAR PRESSURE WIRE/FFR STUDY N/A 07/17/2020     Procedure: INTRAVASCULAR PRESSURE WIRE/FFR STUDY;  Surgeon: Harding, David W, MD;  Location: [facility];  Service: Cardiovascular;  Laterality: N/A;  . LEFT HEART CATH AND CORONARY ANGIOGRAPHY N/A 03/30/2020   Procedure: LEFT HEART CATH AND CORONARY ANGIOGRAPHY;  Surgeon: McAlhany, Christopher D, MD;  Location: [facility];  Service: Cardiovascular;  Laterality: N/A;  . LEFT HEART CATH AND CORONARY ANGIOGRAPHY N/A 07/17/2020   Procedure: LEFT HEART CATH AND CORONARY ANGIOGRAPHY;  Surgeon: Harding, David W, MD;  Location: [facility];  Service: Cardiovascular;  Laterality: N/A;  . LEFT HEART CATHETERIZATION WITH CORONARY ANGIOGRAM N/A 09/24/2012   Procedure: LEFT HEART CATHETERIZATION WITH CORONARY ANGIOGRAM;  Surgeon: Henry W Smith III, MD;  Location: [facility]ATH [facility];  Service: Cardiovascular;  Laterality: N/A;    Prior to Admission medications   Medication Sig Start Date End Date Taking? Authorizing Provider    amLODipine (NORVASC) 10 MG tablet Take 1 tablet (10 mg total) by mouth daily. Patient taking differently: Take 10 mg by mouth at bedtime.  04/01/20  Yes Thompson, Daniel V, MD  aspirin EC 81 MG tablet Take 1 tablet (81 mg total) by mouth daily. Patient taking differently: Take 81 mg by mouth at bedtime.  04/01/20  Yes Thompson, Daniel V, MD  atorvastatin (LIPITOR) 40 MG tablet Take 1 tablet (40 mg total) by mouth daily. Patient taking differently: Take 40 mg by mouth at bedtime.  04/01/20  Yes Thompson, Daniel V, MD  carvedilol (COREG) 12.5 MG tablet Take 1 tablet (12.5 mg total) by mouth 2 (two) times daily. 04/01/20  Yes Thompson, Daniel V, MD  cloNIDine (CATAPRES) 0.1 MG tablet Take 1 tablet (0.1 mg total) by mouth 3 (three) times daily. 04/01/20  Yes Thompson, Daniel V, MD  ezetimibe (ZETIA) 10 MG tablet Take 1 tablet (10 mg total) by mouth daily. Patient taking differently: Take 10 mg by mouth at bedtime.  04/11/20 07/13/20 Yes Tobb, Kardie, DO  fluticasone (FLONASE) 50 MCG/ACT nasal spray Place 1 spray into both nostrils daily as needed for allergies or rhinitis. 04/01/20  Yes Thompson, Daniel V, MD  Multiple Vitamins-Minerals (MULTIVITAMIN WITH MINERALS) tablet Take 1 tablet by mouth See admin instructions. [employer] ABC Plus Senior Multivitamin- Take 1 tablet by mouth once a day   Yes [provider]  nitroGLYCERIN (NITROSTAT) 0.4 MG SL tablet Place 1 tablet (0.4 mg total) under the tongue every 5 (five) minutes as needed for chest pain. 04/01/20  Yes Thompson, Daniel V, MD  pantoprazole (PROTONIX) 40 MG tablet Take 1 tablet (40 mg total) by mouth daily. Patient taking differently: Take 40 mg by mouth at bedtime.  04/01/20  Yes Thompson, Daniel V, MD  ranolazine (RANEXA) 500 MG 12 hr tablet Take 500 mg by mouth 2 (two) times daily.   Yes [provider]  simethicone (MYLICON) 80 MG chewable tablet Chew 2 tablets (160 mg total) by mouth 3 (three) times daily as needed for  flatulence. 04/01/20  Yes Thompson, Daniel V, MD  atorvastatin (LIPITOR) 80 MG tablet Take 1 tablet (80 mg total) by mouth daily. 07/18/20 08/17/20  Arrien, Mauricio Daniel, MD  gabapentin (NEURONTIN) 300 MG capsule Take 1 capsule (300 mg total) by mouth 2 (two) times daily. 07/17/20 08/16/20  Arrien, Mauricio Daniel, MD  meloxicam (MOBIC) 15 MG tablet Take 1 tablet (15 mg total) by mouth daily. 07/17/20 08/16/20  Arrien, Mauricio Daniel, MD    Scheduled Meds: . acetaminophen  650 mg Oral Q6H  . amLODipine    10 mg Oral Daily  . aspirin EC  81 mg Oral Daily  . atorvastatin  80 mg Oral Daily  . carvedilol  12.5 mg Oral BID  . cloNIDine  0.1 mg Oral TID  . enoxaparin (LOVENOX) injection  40 mg Subcutaneous Q24H  . ezetimibe  10 mg Oral QHS  . gabapentin  300 mg Oral BID  . pantoprazole  40 mg Oral Daily  . ranolazine  500 mg Oral BID  . sodium chloride flush  3 mL Intravenous Q12H  . sucralfate  1 g Oral TID WC & HS  . traZODone  50 mg Oral QHS   Infusions: . sodium chloride     PRN Meds: sodium chloride, acetaminophen, fluticasone, nitroGLYCERIN, ondansetron (ZOFRAN) IV, sodium chloride flush   Allergies as of 07/13/2020 - Review Complete 07/13/2020  Allergen Reaction Noted  . Plavix [clopidogrel] Other (See Comments) 06/04/2014  . Iodinated diagnostic agents Nausea And Vomiting and Other (See Comments) 09/20/2012  . Other Other (See Comments) 07/13/2020  . Peanuts [peanut oil] Hives 09/20/2012    Family History  Problem Relation Age of Onset  . Diabetes Father   . CAD Brother   . Hypertension Mother     Social History   Socioeconomic History  . Marital status: Married    Spouse name: Not on file  . Number of children: Not on file  . Years of education: Not on file  . Highest education level: Not on file  Occupational History  . Not on file  Tobacco Use  . Smoking status: Current Every Day Smoker    Types: Cigarettes  . Smokeless tobacco: Never Used  Vaping Use    . Vaping Use: Never used  Substance and Sexual Activity  . Alcohol use: Yes    Alcohol/week: 3.0 - 4.0 standard drinks    Types: 2 Cans of beer, 1 - 2 Shots of liquor per week  . Drug use: Yes    Types: Cocaine  . Sexual activity: Not on file  Other Topics Concern  . Not on file  Social History Narrative  . Not on file   Social Determinants of Health   Financial Resource Strain:   . Difficulty of Paying Living Expenses: Not on file  Food Insecurity:   . Worried About Running Out of Food in the Last Year: Not on file  . Ran Out of Food in the Last Year: Not on file  Transportation Needs:   . Lack of Transportation (Medical): Not on file  . Lack of Transportation (Non-Medical): Not on file  Physical Activity:   . Days of Exercise per Week: Not on file  . Minutes of Exercise per Session: Not on file  Stress:   . Feeling of Stress : Not on file  Social Connections:   . Frequency of Communication with Friends and Family: Not on file  . Frequency of Social Gatherings with Friends and Family: Not on file  . Attends Religious Services: Not on file  . Active Member of Clubs or Organizations: Not on file  . Attends Club or Organization Meetings: Not on file  . Marital Status: Not on file  Intimate Partner Violence:   . Fear of Current or Ex-Partner: Not on file  . Emotionally Abused: Not on file  . Physically Abused: Not on file  . Sexually Abused: Not on file    REVIEW OF SYSTEMS: Constitutional: No weakness, no fatigue ENT:  No nose bleeds Pulm: No cough, no shortness of breath CV:    No palpitations, no LE edema.  GU:  No hematuria, no frequency GI: See HPI Heme: No history excessive or unusual bleeding or bruising.  No history of anemia. Transfusions: No previous blood transfusions. Neuro:  No headaches, no peripheral tingling or numbness.  No syncope, no seizures. Derm:  No itching, no rash or sores.  Endocrine:  No sweats or chills.  No polyuria or  dysuria Immunization: Vaccinated for COVID-19 and several other vaccinations including Pneumovax and flu. Travel:  None beyond local counties in last few months.    PHYSICAL EXAM: Vital signs in last 24 hours: Vitals:   07/18/20 0500 07/18/20 0814  BP: (!) 173/96 (!) 154/83  Pulse: 79 71  Resp: 18 20  Temp: 99 F (37.2 C) 98.4 F (36.9 C)  SpO2: 99% 98%   Wt Readings from Last 3 Encounters:  07/18/20 81.2 kg  04/11/20 78.7 kg  03/29/20 79.2 kg    General: Well-appearing.  Pressured speech Head: No facial asymmetry or signs of head trauma. Eyes: No scleral icterus.  No conjunctival pallor. Ears: No hearing deficit Nose: No congestion or discharge Mouth: Tongue midline.  Mucosa moist, pink, clear.  Tongue midline Neck: No JVD, no masses, no thyromegaly Lungs: Reduced breath sounds but clear bilaterally.  No dyspnea or cough Heart: RRR.  No MRG.  S1, S2 present Abdomen: Soft, not tender.  No masses, bruits, hernias.  Bowel sounds active.   Rectal: Deferred Musc/Skeltl:'s Extremities: No CCE. Neurologic: Oriented x3.  No tremor.  No gross deficits or weakness. Skin: No rash, no sores   Psych: Venting his frustration and anger at perceived lack of sympathetic medical care  Intake/Output from previous day: 10/12 0701 - 10/13 0700 In: 2295 [P.O.:716; I.V.:1579] Out: 3200 [Urine:3200] Intake/Output this shift: No intake/output data recorded.  LAB RESULTS: Recent Labs    07/18/20 0454  WBC 16.3*  HGB 11.6*  HCT 34.2*  PLT 247   BMET Lab Results  Component Value Date   NA 140 07/18/2020   NA 142 07/16/2020   NA 140 07/14/2020   K 3.9 07/18/2020   K 4.0 07/16/2020   K 3.9 07/14/2020   CL 106 07/18/2020   CL 108 07/16/2020   CL 102 07/14/2020   CO2 27 07/18/2020   CO2 27 07/16/2020   CO2 24 07/14/2020   GLUCOSE 161 (H) 07/18/2020   GLUCOSE 84 07/16/2020   GLUCOSE 72 07/14/2020   BUN 10 07/18/2020   BUN 7 07/16/2020   BUN 18 07/14/2020   CREATININE 1.09  07/18/2020   CREATININE 0.99 07/16/2020   CREATININE 1.08 07/14/2020   CALCIUM 9.0 07/18/2020   CALCIUM 9.2 07/16/2020   CALCIUM 8.9 07/14/2020   LFT No results for input(s): PROT, ALBUMIN, AST, ALT, ALKPHOS, BILITOT, BILIDIR, IBILI in the last 72 hours. PT/INR Lab Results  Component Value Date   INR 1.09 09/20/2012   Hepatitis Panel No results for input(s): HEPBSAG, HCVAB, HEPAIGM, HEPBIGM in the last 72 hours. C-Diff No components found for: CDIFF Lipase  No results found for: LIPASE  Drugs of Abuse     Component Value Date/Time   LABOPIA NONE DETECTED 07/14/2020 0955   COCAINSCRNUR POSITIVE (A) 07/14/2020 0955   LABBENZ NONE DETECTED 07/14/2020 0955   AMPHETMU NONE DETECTED 07/14/2020 0955   THCU NONE DETECTED 07/14/2020 0955   LABBARB NONE DETECTED 07/14/2020 0955     RADIOLOGY STUDIES: CT HEAD WO CONTRAST  Result Date: 07/16/2020 CLINICAL DATA:  Dizziness EXAM: CT HEAD WITHOUT CONTRAST TECHNIQUE: Contiguous   axial images were obtained from the base of the skull through the vertex without intravenous contrast. COMPARISON:  CT head 03/10/2018 FINDINGS: Brain: Mild atrophy with progression. Diffuse bilateral white matter hypodensity also with progression. Negative for acute infarct, hemorrhage, mass Vascular: Negative for hyperdense vessel Skull: Negative Sinuses/Orbits: Paranasal sinuses clear.  Negative orbit. Other: None IMPRESSION: Progressive chronic microvascular ischemic change which is now extensive. Mild atrophy also with progression No acute abnormality. Electronically Signed   By: Charles  Clark M.D.   On: 07/16/2020 13:54   CARDIAC CATHETERIZATION  Result Date: 07/17/2020  The left ventricular systolic function is normal. The left ventricular ejection fraction is 45-50% by visual estimate.  LV end diastolic pressure is normal.  Prox RCA-1 lesion is 30% stenosed.  Prox RCA-2 STENT is 10% stenosed.  Prox RCA to Mid RCA lesion is 30% stenosed.  Mid RCA to Dist  RCA STENT is 20% stenosed.  Prox LAD lesion is 50% stenosed. - FFR 0.84 (Not significant)  Prox LAD to Mid LAD STENT is 5% stenosed.  Prox Cx lesion is 70% stenosed with 30% stenosed side branch in 1st Mrg. - DFR 0.97 (Negative)  SUMMARY  Stable three-vessel CAD with patent RCA stents and minimal diffuse disease in the RCA, stable patent LAD stent with roughly 50% (FFR negative) lesion just proximal to the stent, stable roughly 70% mid LCx (DFR negative) lesion.  Poor imaging on LV gram.  EF appears to be somewhat down, but this is not consistent with echocardiogram findings.  No obvious culprit lesion to explain resting chest pain.  Recommend evaluated nonischemic/noncardiac etiology. RECOMMENDATIONS  Return to nursing unit for ongoing care.  Continue aggressive medical management.  Considered nonanginal cause for chest pain that was resting upon arrival to the Cath Lab.  Although angiographically the most significant potential lesion is the LCx lesion this was actually grossly normal by DFR. David Harding, MD    IMPRESSION:   *    Epigastric burning.  Persists despite inpt Protonix and Carafate.  Patient reported remote hx of peptic ulcer disease.  TUMS only as outpt provides short-lived relief.   Rule out PUD, rule out GERD.  Rule out functional discomfort.      PLAN:     *    Dr. Pyrtle will see the pt.  In the meantime went ahead and scheduled EGD for tomorrow as he is not n.p.o. now we will need to wait.   Roseline Ebarb  07/18/2020, 11:57 AM Phone [phone]     

## 2020-07-18 NOTE — Progress Notes (Signed)
OT Note  Patient Details Name: Cody [name] MRN: [id number] DOB: 05-Mar-1961  Pt to follow up with outpt PT per his request. Pt able to complete self care and mobility and no further OT needs. Signing off.       Thornell Mule, OT/L   Acute OT Clinical Specialist Acute Rehabilitation Services Pager [phone] Office [phone]  07/18/2020, 7:39 AM

## 2020-07-18 NOTE — Progress Notes (Signed)
Report given to [place], Charity fundraiser. Patient is alert and oriented x4 resting in bed with call light in reach. VSS. SR on tele. Right radial remains a level 0 post cath. Plan is for patient to be medically treated and follow up outpatient.  WBC 16.3, Hgb 11.6, platelets 247, K+ 3.9 and Creat 1.09 this AM. Likely DC home today.

## 2020-07-18 NOTE — Care Management Important Message (Signed)
Important Message  Patient Details  Name: Cody [name] MRN: [id number] Date of Birth: [birth year]   Medicare Important Message Given:  Yes [employer] Appeal Detailed Notice of Discharge letter created and saved: Yes Detailed Notice of Discharge Document Given to [employer]: Yes [employer] ROI Document Created: Yes [employer] appeal documents uploaded to [employer] stite: Yes  Aleeya Veitch P Kristl Morioka 07/18/2020, 1:20 PM

## 2020-07-19 ENCOUNTER — Encounter (HOSPITAL_COMMUNITY)
Admission: EM | Disposition: A | Discharge status: Home / Self Care | Payer: Self-pay | Source: Home / Self Care | Attending: Internal Medicine

## 2020-07-19 ENCOUNTER — Inpatient Hospital Stay (HOSPITAL_COMMUNITY): Payer: Medicare Other | Admitting: Anesthesiology

## 2020-07-19 DIAGNOSIS — Q399 Congenital malformation of esophagus, unspecified: Secondary | ICD-10-CM

## 2020-07-19 DIAGNOSIS — K222 Esophageal obstruction: Secondary | ICD-10-CM

## 2020-07-19 DIAGNOSIS — K2970 Gastritis, unspecified, without bleeding: Secondary | ICD-10-CM

## 2020-07-19 DIAGNOSIS — K449 Diaphragmatic hernia without obstruction or gangrene: Secondary | ICD-10-CM

## 2020-07-19 HISTORY — PX: BIOPSY: SHX5522

## 2020-07-19 HISTORY — PX: ESOPHAGOGASTRODUODENOSCOPY (EGD) WITH PROPOFOL: SHX5813

## 2020-07-19 SURGERY — ESOPHAGOGASTRODUODENOSCOPY (EGD) WITH PROPOFOL
Anesthesia: Monitor Anesthesia Care

## 2020-07-19 MED ORDER — MELOXICAM 7.5 MG PO TABS
15.0000 mg | ORAL_TABLET | Freq: Every day | ORAL | Status: DC
  Administered 2020-07-19 – 2020-07-20 (×2): 15 mg via ORAL
  Filled 2020-07-19 (×2): qty 2

## 2020-07-19 MED ORDER — PROPOFOL 10 MG/ML IV BOLUS
INTRAVENOUS | Status: DC | PRN
  Administered 2020-07-19: 15 mg via INTRAVENOUS
  Administered 2020-07-19 (×2): 30 mg via INTRAVENOUS

## 2020-07-19 MED ORDER — LIDOCAINE HCL (CARDIAC) PF 100 MG/5ML IV SOSY
PREFILLED_SYRINGE | INTRAVENOUS | Status: DC | PRN
  Administered 2020-07-19: 100 mg via INTRAVENOUS

## 2020-07-19 MED ORDER — GLYCOPYRROLATE 0.2 MG/ML IJ SOLN
INTRAMUSCULAR | Status: DC | PRN
  Administered 2020-07-19: .2 mg via INTRAVENOUS

## 2020-07-19 MED ORDER — PROPOFOL 500 MG/50ML IV EMUL
INTRAVENOUS | Status: DC | PRN
  Administered 2020-07-19: 75 ug/kg/min via INTRAVENOUS

## 2020-07-19 MED ORDER — LACTATED RINGERS IV SOLN: INTRAVENOUS | Status: DC

## 2020-07-19 SURGICAL SUPPLY — 15 items

## 2020-07-19 NOTE — Progress Notes (Signed)
Physical Therapy Treatment Patient Details Name: Cody [name] MRN: [id number] DOB: [birth year] Today's Date: 07/19/2020    History of Present Illness 59yo male c/o chest pain for the past 2 weeks associated with nausea and parasthesias in BLEs and hands. ACS ruled out. Covid negative. Admitted for chest pain w/u. PMH PAD, HTN, cocaine abuse, hx of cardiac cath. CT: mild brain atrophy with progression. Diffuse bilateral while matter hypodensity with progression. (-) for CVA    PT Comments    Pt appreciative of therapy visit, wanting to ambulate without AD to see how he does. Pt is independent to come to the EoB, min guard for safety with transfers and initial gait. Pt experiences increase in R LE sciatic nerve pain requiring min A for steadying with return to bed. Educated on pt need to use RW for safety. Pt to d/c home tomorrow.    Follow Up Recommendations  Outpatient PT     Equipment Recommendations  Rolling walker with 5" wheels;Other (comment) (prefers rollator which should be fine)       Precautions / Restrictions Precautions Precautions: Fall Precaution Comments: L eye visual impairment    Mobility  Bed Mobility Overal bed mobility: Independent Bed Mobility: Supine to Sit;Sit to Supine     Supine to sit: Independent;HOB elevated Sit to supine: Independent   General bed mobility comments: no physical assist needed  Transfers Overall transfer level: Needs assistance Equipment used: Rolling walker (2 wheeled) Transfers: Sit to/from Stand Sit to Stand: Min guard         General transfer comment: min guard for safety with powerup   Ambulation/Gait Ambulation/Gait assistance: Min guard;Min assist Gait Distance (Feet): 20 Feet Assistive device: None Gait Pattern/deviations: Decreased stride length;Decreased step length - right;Decreased step length - left;Step-through pattern;Decreased stance time - right Gait velocity: decreased   General Gait Details: min guard  to walk to door, then pt with complaints of sciatic nerve pain and weakness in R LE, requiring min A for steadying to get back to bed           Balance Overall balance assessment: Needs assistance Sitting-balance support: Feet supported Sitting balance-Leahy Scale: Normal     Standing balance support: Bilateral upper extremity supported;During functional activity Standing balance-Leahy Scale: Fair                              Cognition Arousal/Alertness: Awake/alert Behavior During Therapy: WFL for tasks assessed/performed Overall Cognitive Status: History of cognitive impairments - at baseline                                 General Comments: very tangential, difficulty keeping on task          General Comments General comments (skin integrity, edema, etc.): VSS on RA      Pertinent Vitals/Pain Pain Assessment: Faces Faces Pain Scale: Hurts little more Pain Location: R sciatica Pain Descriptors / Indicators: Aching Pain Intervention(s): Limited activity within patient's tolerance;Monitored during session;Repositioned           PT Goals (current goals can now be found in the care plan section) Acute Rehab PT Goals Patient Stated Goal: to go home today, but he also wants other procedure to check his stomach while he is here. PT Goal Formulation: With patient Time For Goal Achievement: 07/30/20 Potential to Achieve Goals: Fair Progress towards PT goals: Progressing toward goals  Frequency    Min 3X/week      PT Plan Current plan remains appropriate       AM-PAC PT "6 Clicks" Mobility   Outcome Measure  Help needed turning from your back to your side while in a flat bed without using bedrails?: None Help needed moving from lying on your back to sitting on the side of a flat bed without using bedrails?: None Help needed moving to and from a bed to a chair (including a wheelchair)?: A Little Help needed standing up from a chair  using your arms (e.g., wheelchair or bedside chair)?: A Little Help needed to walk in hospital room?: A Little Help needed climbing 3-5 steps with a railing? : A Little 6 Click Score: 20    End of Session Equipment Utilized During Treatment: Gait belt Activity Tolerance: Patient limited by fatigue Patient left: in bed;with call bell/phone within reach Nurse Communication: Mobility status;Other (comment) PT Visit Diagnosis: Unsteadiness on feet (R26.81);Muscle weakness (generalized) (M62.81);Difficulty in walking, not elsewhere classified (R26.2)     Time: 6962-9528 PT Time Calculation (min) (ACUTE ONLY): 19 min  Charges:  $Gait Training: 8-22 mins                     Cody [name] B. Cody [name] PT, DPT Acute Rehabilitation Services Pager [phone] Office [phone]    Cody [name] 07/19/2020, 1:01 PM

## 2020-07-19 NOTE — Care Management (Signed)
07-19-20 Case Manager received notice from [employer] that the patient lost his appeal. Case # [id number] TW-Patient has to be dc before 12:00 noon on 07-20-20 so he will not be at risk for paying for hospital stay. Case Manager also called [employer] and the Rollator has been drop shipped from the office to the patients' home. Delivery date is expected before 07-24-20. Case Manager will make the patient aware. [name], RN,BSN Case Manager

## 2020-07-19 NOTE — Progress Notes (Signed)
Patient off unit in endoscopy for EGD. Patient left unit via wheelchair accompanied by transport. VSS.

## 2020-07-19 NOTE — Progress Notes (Signed)
Patient was given his am medication and discussed his discharge with him.  He said he did not think he would be going anywhere until tomorrow morning.  Informed patients nurse.  Printed discharge and went over it with the patient but did not remove his IV. Per patients request

## 2020-07-19 NOTE — Progress Notes (Signed)
Noted result of EGD negative for ulcers. Patient is tolerating po well. He continue to be medically stable for discharge.  I spoke with him this am and all questions have been addressed. He will follow as outpatient with primary care, neurology and cardiology.

## 2020-07-19 NOTE — Transfer of Care (Signed)
Immediate Anesthesia Transfer of Care Note  Patient: Cody [name]  Procedure(s) Performed: ESOPHAGOGASTRODUODENOSCOPY (EGD) WITH PROPOFOL (N/A ) BIOPSY  Patient Location: PACU and Endoscopy Unit  Anesthesia Type:MAC  Level of Consciousness: drowsy  Airway & Oxygen Therapy: Patient Spontanous Breathing and Patient connected to nasal cannula oxygen  Post-op Assessment: Report given to RN, Post -op Vital signs reviewed and stable and Patient moving all extremities  Post vital signs: Reviewed and stable  Last Vitals:  Vitals Value Taken Time  BP 95/49 07/19/20 0830  Temp 36.7 C 07/19/20 0830  Pulse 64 07/19/20 0832  Resp 16 07/19/20 0832  SpO2 100 % 07/19/20 0832  Vitals shown include unvalidated device data.  Last Pain:  Vitals:   07/19/20 0830  TempSrc: Axillary  PainSc: 0-No pain         Complications: No complications documented.

## 2020-07-19 NOTE — Anesthesia Postprocedure Evaluation (Signed)
Anesthesia Post Note  Patient: Cody [name]  Procedure(s) Performed: ESOPHAGOGASTRODUODENOSCOPY (EGD) WITH PROPOFOL (N/A ) BIOPSY     Patient location during evaluation: Endoscopy Anesthesia Type: MAC Level of consciousness: awake and alert Pain management: pain level controlled Vital Signs Assessment: post-procedure vital signs reviewed and stable Respiratory status: spontaneous breathing, nonlabored ventilation and respiratory function stable Cardiovascular status: blood pressure returned to baseline and stable Postop Assessment: no apparent nausea or vomiting Anesthetic complications: no   No complications documented.  Last Vitals:  Vitals:   07/19/20 0830 07/19/20 0840  BP: (!) 95/49 (!) 104/58  Pulse: 67 68  Resp: 17 (!) 25  Temp: 36.7 C   SpO2: 100% 100%    Last Pain:  Vitals:   07/19/20 0840  TempSrc:   PainSc: 0-No pain                 Merlinda Frederick

## 2020-07-19 NOTE — Op Note (Addendum)
[facility] Patient Name: Cody Congressimmy Dismore Procedure Date : 07/19/2020 MRN: [id number] Attending MD: Lynann Bolognaajesh Kasper Mudrick , MD Date of Birth: 08/17/1961 CSN: [id number] Age: 59 Admit Type: Inpatient Procedure:                Upper GI endoscopy Indications:              Noncardiac chest pains. Gastroesophageal reflux.                            Remote history of peptic ulcer disease. No                            dysphagia. Providers:                Lynann Bolognaajesh Sujata Maines, MD, Alexandria LodgeJarmilla Fucs RN, RN, Kandice RobinsonsGuillaume                            [place], Technician Referring MD:              Medicines:                Monitored Anesthesia Care Complications:            No immediate complications. Estimated Blood Loss:     Estimated blood loss: none. Procedure:                Pre-Anesthesia Assessment:                           - Prior to the procedure, a History and Physical                            was performed, and patient medications and                            allergies were reviewed. The patient's tolerance of                            previous anesthesia was also reviewed. The risks                            and benefits of the procedure and the sedation                            options and risks were discussed with the patient.                            All questions were answered, and informed consent                            was obtained. Prior Anticoagulants: The patient has                            taken no previous anticoagulant or antiplatelet                            agents. ASA Grade Assessment:  II - A patient with                            mild systemic disease. After reviewing the risks                            and benefits, the patient was deemed in                            satisfactory condition to undergo the procedure.                           After obtaining informed consent, the endoscope was                            passed under direct vision. Throughout  the                            procedure, the patient's blood pressure, pulse, and                            oxygen saturations were monitored continuously. The                            GIF-H190 ([id number]) Olympus gastroscope was                            introduced through the mouth, and advanced to the                            second part of duodenum. The upper GI endoscopy was                            accomplished without difficulty. The patient                            tolerated the procedure well. Scope In: Scope Out: Findings:      The lower third of the esophagus was mildly tortuous but normal.       Biopsies were taken with a cold forceps from proximal, mid and distal       esophagus to R/O EoE.      A widely patent and non-obstructing Schatzki ring was found at the       gastroesophageal junction, 42 cm from the incisors, with 3 small 4 mm       whitish lesions (may represent mild Candida). Biopsies were taken with a       cold forceps for histology.      A small hiatal hernia was present.      Localized mild inflammation characterized by erythema was found in the       gastric antrum. Biopsies were taken with a cold forceps for histology.      The examined duodenum was normal. Impression:               - Widely patent and non-obstructing Schatzki ring.  Biopsied.                           - Small whitish lesions at the GE junction (may                            represent mild Candida esophagitis). Biopsied                           - Small hiatal hernia.                           - Mild gastritis. Recommendation:           - Return patient to hospital ward for ongoing care.                           - Resume previous diet.                           - Continue Protonix 40 mg p.o. once a day.                           - Stop cocaine use.                           - Await pathology results.                           - Return to GI clinic in  6-8 weeks with Dr Rhea Belton.                            Recommend screening colonoscopy as outpatient.                           - If he still has throat problems, recommend ENT                            consultation.                           - Discussed in detail with patient. He did not want                            me to call any family members.                           - Will sign off for now. Pl call with any                            ?/concerns. Procedure Code(s):        --- Professional ---                           [phone], Esophagogastroduodenoscopy, flexible,  transoral; with biopsy, single or multiple Diagnosis Code(s):        --- Professional ---                           Q39.9, Congenital malformation of esophagus,                            unspecified                           K22.2, Esophageal obstruction                           K44.9, Diaphragmatic hernia without obstruction or                            gangrene                           K29.70, Gastritis, unspecified, without bleeding                           R10.13, Epigastric pain CPT copyright 2019 American Medical Association. All rights reserved. The codes documented in this report are preliminary and upon coder review may  be revised to meet current compliance requirements. Lynann Bologna, MD 07/19/2020 8:37:50 AM This report has been signed electronically. Number of Addenda: 0

## 2020-07-19 NOTE — Progress Notes (Signed)
Report given to [place], Charity fundraiser. Patient off unit in endo for EGD. VSS overnight. Rollator being delivered to patients's home. No labs this AM. Likely to be DC'd home after EGD.

## 2020-07-19 NOTE — Interval H&P Note (Signed)
History and Physical Interval Note:  07/19/2020 8:03 AM  Cody [name]  has presented today for surgery, with the diagnosis of Burning epigastric/upper abdominal pain.  pt reported history of ulcer disease remotely.  The various methods of treatment have been discussed with the patient and family. After consideration of risks, benefits and other options for treatment, the patient has consented to  Procedure(s): ESOPHAGOGASTRODUODENOSCOPY (EGD) WITH PROPOFOL (N/A) as a surgical intervention.  The patient's history has been reviewed, patient examined, no change in status, stable for surgery.  I have reviewed the patient's chart and labs.  Questions were answered to the patient's satisfaction.     Lynann Bologna

## 2020-07-20 NOTE — Care Management (Signed)
07-20-20 1019 Case Manager placed an ambulatory referral to [facility] location for outpatient physical therapy. No further needs from Case Manager at this time. [name], RN,BSN Case Manager

## 2020-07-20 NOTE — Care Management (Signed)
07-20-20 1049 Patient discharged and left the building at 1045 am. Patient has transportation home. No further needs. [name], RN,BSN Case Manager

## 2020-07-20 NOTE — Progress Notes (Signed)
Report given to [place], Charity fundraiser. Patient is alert and oriented x4 sitting up in bedside chair with call light in reach. VSS. Patient states no pain. No labs this AM. Plan is to DC patient home today. Rollator being delivered to patient's home.

## 2020-07-21 LAB — SURGICAL PATHOLOGY

## 2020-07-22 ENCOUNTER — Encounter (HOSPITAL_COMMUNITY): Payer: Self-pay | Admitting: Gastroenterology

## 2020-07-22 ENCOUNTER — Encounter: Payer: Self-pay | Admitting: Gastroenterology

## 2020-07-22 NOTE — Progress Notes (Signed)
Bx-Candida esophagitis   please call in Diflucan 200 mg p.o. x1 followed by 100 mg p.o. once a day x 14 days. Stop statins while taking Diflucan.  RG

## 2020-07-23 ENCOUNTER — Encounter: Payer: Self-pay | Admitting: Gastroenterology

## 2020-07-23 ENCOUNTER — Ambulatory Visit: Payer: Medicare Other | Admitting: Cardiology

## 2020-07-23 ENCOUNTER — Other Ambulatory Visit: Payer: Self-pay | Admitting: Gastroenterology

## 2020-07-23 MED ORDER — FLUCONAZOLE 100 MG PO TABS: ORAL_TABLET | ORAL | 0 refills | Status: DC

## 2020-07-27 ENCOUNTER — Other Ambulatory Visit: Payer: Self-pay

## 2020-07-27 ENCOUNTER — Ambulatory Visit (INDEPENDENT_AMBULATORY_CARE_PROVIDER_SITE_OTHER): Payer: Medicare Other | Admitting: Cardiology

## 2020-07-27 ENCOUNTER — Encounter: Payer: Self-pay | Admitting: Cardiology

## 2020-07-27 VITALS — BP 132/74 | HR 69 | Ht 66.0 in | Wt 179.2 lb

## 2020-07-27 DIAGNOSIS — I2510 Atherosclerotic heart disease of native coronary artery without angina pectoris: Secondary | ICD-10-CM | POA: Diagnosis not present

## 2020-07-27 DIAGNOSIS — I10 Essential (primary) hypertension: Secondary | ICD-10-CM | POA: Diagnosis not present

## 2020-07-27 DIAGNOSIS — E785 Hyperlipidemia, unspecified: Secondary | ICD-10-CM

## 2020-07-27 DIAGNOSIS — I739 Peripheral vascular disease, unspecified: Secondary | ICD-10-CM | POA: Diagnosis not present

## 2020-07-27 DIAGNOSIS — Z7689 Persons encountering health services in other specified circumstances: Secondary | ICD-10-CM

## 2020-07-27 MED ORDER — CLONIDINE HCL 0.1 MG PO TABS: 0.1000 mg | ORAL_TABLET | Freq: Three times a day (TID) | ORAL | 3 refills | Status: DC

## 2020-07-27 MED ORDER — NITROGLYCERIN 0.4 MG SL SUBL: 0.4000 mg | SUBLINGUAL_TABLET | SUBLINGUAL | 1 refills | Status: DC | PRN

## 2020-07-27 MED ORDER — RANOLAZINE ER 500 MG PO TB12
500.0000 mg | ORAL_TABLET | Freq: Two times a day (BID) | ORAL | 3 refills | Status: DC
Start: 2020-07-27 — End: 2021-01-03

## 2020-07-27 NOTE — Progress Notes (Signed)
Cardiology Office Note:    Date:  07/27/2020   ID:  Cody [name], DOB [birth year], MRN [id number]  PCP:  Ailene Ravel, MD  Cardiologist:  Thomasene Ripple, DO  Electrophysiologist:  None   Referring MD: Ailene Ravel, MD   " I am doing ok, I was in the hospital and had a terrible time"  History of Present Illness:    Cody [name] is a 59 y.o. male with a hx of coronary disease status post recent left heart catheterization on 03/30/2020 at that time his previous stent was widely patent, he did have some moderate disease in his LAD as well as his left circumflex artery.  Left circumflex artery was DFR was 0.99 resting that this was not a significant stenosis, dyslipidemia, hypertension, PAD and tobacco use.  Since his last visit he was admitted to the [facility] for chest pain.  During his hospitalization he underwent a left heart catheterization and no PCI was indicated.  He also did undergo an endoscopy as he noted today he had abdominal pain.  He has nonobstructing Schatzki ring with mild gastritis.  Of note he was also cocaine positive during his hospitalization.  He was recently placed on Diflucan by GI and he will take this medication for 14 days.  During his encounter today he spent the first 15 minutes complaining about everybody in the hospital.  He spoke negatively about his experience.  He was not happy with the way his IVs were placed.  He also was not happy that he was not put to sleep during his catheterization.  I did explain to the patient that general practice for heart catheterization we do not fully sedate the patient or put the patient to sleep.  He also complained that his endoscopy was normal and he had not still he got a call from GI asking him to take Diflucan he was upset I have explained to the patient that the initial endoscopy once the biopsy is sent it takes days for the report the BL.  So I explained to him that his biopsy did show PACs as seen positive  for fungal elements, which would explain the treatment update by GI with Diflucan.    Past Medical History:  Diagnosis Date   Cocaine abuse (HCC) 09/20/2012   Coronary artery disease    Dyslipidemia 09/20/2012   GERD (gastroesophageal reflux disease)    has stomach ulcers a while back   HTN (hypertension)    PAD (peripheral artery disease) (HCC) 09/20/2012   Smoker 09/20/2012    Past Surgical History:  Procedure Laterality Date   BIOPSY  07/19/2020   Procedure: BIOPSY;  Surgeon: Lynann Bologna, MD;  Location: [facility] ENDOSCOPY;  Service: Gastroenterology;;   CORONARY STENT PLACEMENT     ESOPHAGOGASTRODUODENOSCOPY (EGD) WITH PROPOFOL N/A 07/19/2020   Procedure: ESOPHAGOGASTRODUODENOSCOPY (EGD) WITH PROPOFOL;  Surgeon: Lynann Bologna, MD;  Location: [facility] ENDOSCOPY;  Service: Gastroenterology;  Laterality: N/A;   INTRAVASCULAR PRESSURE WIRE/FFR STUDY N/A 03/30/2020   Procedure: INTRAVASCULAR PRESSURE WIRE/FFR STUDY;  Surgeon: Kathleene Hazel, MD;  Location: [facility] INVASIVE CV LAB;  Service: Cardiovascular;  Laterality: N/A;   INTRAVASCULAR PRESSURE WIRE/FFR STUDY N/A 07/17/2020   Procedure: INTRAVASCULAR PRESSURE WIRE/FFR STUDY;  Surgeon: Marykay Lex, MD;  Location: [facility] INVASIVE CV LAB;  Service: Cardiovascular;  Laterality: N/A;   LEFT HEART CATH AND CORONARY ANGIOGRAPHY N/A 03/30/2020   Procedure: LEFT HEART CATH AND CORONARY ANGIOGRAPHY;  Surgeon: Kathleene Hazel, MD;  Location: [facility] INVASIVE CV  LAB;  Service: Cardiovascular;  Laterality: N/A;   LEFT HEART CATH AND CORONARY ANGIOGRAPHY N/A 07/17/2020   Procedure: LEFT HEART CATH AND CORONARY ANGIOGRAPHY;  Surgeon: Marykay Lex, MD;  Location: [facility] INVASIVE CV LAB;  Service: Cardiovascular;  Laterality: N/A;   LEFT HEART CATHETERIZATION WITH CORONARY ANGIOGRAM N/A 09/24/2012   Procedure: LEFT HEART CATHETERIZATION WITH CORONARY ANGIOGRAM;  Surgeon: Lesleigh Noe, MD;  Location: [facility] CATH LAB;  Service:  Cardiovascular;  Laterality: N/A;    Current Medications: Current Meds  Medication Sig   amLODipine (NORVASC) 10 MG tablet Take 1 tablet (10 mg total) by mouth daily. (Patient taking differently: Take 10 mg by mouth at bedtime. )   ASPIRIN 81 PO SMARTSIG:1 Tablet(s) By Mouth Daily   aspirin EC 81 MG tablet Take 1 tablet (81 mg total) by mouth daily. (Patient taking differently: Take 81 mg by mouth at bedtime. )   atorvastatin (LIPITOR) 80 MG tablet Take 1 tablet (80 mg total) by mouth daily.   carvedilol (COREG) 12.5 MG tablet Take 1 tablet (12.5 mg total) by mouth 2 (two) times daily.   cloNIDine (CATAPRES) 0.1 MG tablet Take 1 tablet (0.1 mg total) by mouth 3 (three) times daily.   ezetimibe (ZETIA) 10 MG tablet Take 1 tablet (10 mg total) by mouth daily. (Patient taking differently: Take 10 mg by mouth at bedtime. )   fluconazole (DIFLUCAN) 100 MG tablet Take Diflucan 200 mg for 1 day then take Diflucan 100 mg daily for 14 days Stop statins while taking Diflucan.   fluticasone (FLONASE) 50 MCG/ACT nasal spray Place 1 spray into both nostrils daily as needed for allergies or rhinitis.   gabapentin (NEURONTIN) 300 MG capsule Take 1 capsule (300 mg total) by mouth 2 (two) times daily.   meloxicam (MOBIC) 15 MG tablet Take 1 tablet (15 mg total) by mouth daily.   Multiple Vitamins-Minerals (MULTIVITAMIN WITH MINERALS) tablet Take 1 tablet by mouth See admin instructions. [employer] ABC Plus Senior Multivitamin- Take 1 tablet by mouth once a day   nitroGLYCERIN (NITROSTAT) 0.4 MG SL tablet Place 1 tablet (0.4 mg total) under the tongue every 5 (five) minutes as needed for chest pain.   pantoprazole (PROTONIX) 40 MG tablet Take 1 tablet (40 mg total) by mouth daily. (Patient taking differently: Take 40 mg by mouth at bedtime. )   ranolazine (RANEXA) 500 MG 12 hr tablet Take 1 tablet (500 mg total) by mouth 2 (two) times daily.   simethicone (MYLICON) 80 MG chewable tablet Chew  2 tablets (160 mg total) by mouth 3 (three) times daily as needed for flatulence.   [DISCONTINUED] cloNIDine (CATAPRES) 0.1 MG tablet Take 1 tablet (0.1 mg total) by mouth 3 (three) times daily.   [DISCONTINUED] nitroGLYCERIN (NITROSTAT) 0.4 MG SL tablet Place 1 tablet (0.4 mg total) under the tongue every 5 (five) minutes as needed for chest pain.   [DISCONTINUED] ranolazine (RANEXA) 500 MG 12 hr tablet Take 500 mg by mouth 2 (two) times daily.     Allergies:   Plavix [clopidogrel], Iodinated diagnostic agents, Other, and Peanuts [peanut oil]   Social History   Socioeconomic History   Marital status: Married    Spouse name: Not on file   Number of children: Not on file   Years of education: Not on file   Highest education level: Not on file  Occupational History   Not on file  Tobacco Use   Smoking status: Current Every Day Smoker  Types: Cigarettes   Smokeless tobacco: Never Used  Vaping Use   Vaping Use: Never used  Substance and Sexual Activity   Alcohol use: Yes    Alcohol/week: 3.0 - 4.0 standard drinks    Types: 2 Cans of beer, 1 - 2 Shots of liquor per week   Drug use: Yes    Types: Cocaine   Sexual activity: Not on file  Other Topics Concern   Not on file  Social History Narrative   Not on file   Social Determinants of Health   Financial Resource Strain:    Difficulty of Paying Living Expenses: Not on file  Food Insecurity:    Worried About Running Out of Food in the Last Year: Not on file   Ran Out of Food in the Last Year: Not on file  Transportation Needs:    Lack of Transportation (Medical): Not on file   Lack of Transportation (Non-Medical): Not on file  Physical Activity:    Days of Exercise per Week: Not on file   Minutes of Exercise per Session: Not on file  Stress:    Feeling of Stress : Not on file  Social Connections:    Frequency of Communication with Friends and Family: Not on file   Frequency of Social Gatherings  with Friends and Family: Not on file   Attends Religious Services: Not on file   Active Member of Clubs or Organizations: Not on file   Attends Banker Meetings: Not on file   Marital Status: Not on file     Family History: The patient's family history includes CAD in his brother; Diabetes in his father; Hypertension in his mother.  ROS:   Review of Systems  Constitution: Negative for decreased appetite, fever and weight gain.  HENT: Negative for congestion, ear discharge, hoarse voice and sore throat.   Eyes: Negative for discharge, redness, vision loss in right eye and visual halos.  Cardiovascular: Negative for chest pain, dyspnea on exertion, leg swelling, orthopnea and palpitations.  Respiratory: Negative for cough, hemoptysis, shortness of breath and snoring.   Endocrine: Negative for heat intolerance and polyphagia.  Hematologic/Lymphatic: Negative for bleeding problem. Does not bruise/bleed easily.  Skin: Negative for flushing, nail changes, rash and suspicious lesions.  Musculoskeletal: Negative for arthritis, joint pain, muscle cramps, myalgias, neck pain and stiffness.  Gastrointestinal: Negative for abdominal pain, bowel incontinence, diarrhea and excessive appetite.  Genitourinary: Negative for decreased libido, genital sores and incomplete emptying.  Neurological: Negative for brief paralysis, focal weakness, headaches and loss of balance.  Psychiatric/Behavioral: Negative for altered mental status, depression and suicidal ideas.  Allergic/Immunologic: Negative for HIV exposure and persistent infections.    EKGs/Labs/Other Studies Reviewed:    The following studies were reviewed today:   EKG: None today.  Recent Labs: 07/14/2020: Magnesium 2.6 07/18/2020: BUN 10; Creatinine, Ser 1.09; Hemoglobin 11.6; Platelets 247; Potassium 3.9; Sodium 140  Recent Lipid Panel    Component Value Date/Time   CHOL 117 07/16/2020 1200   TRIG 79 07/16/2020 1200    HDL 28 (L) 07/16/2020 1200   CHOLHDL 4.2 07/16/2020 1200   VLDL 16 07/16/2020 1200   LDLCALC 73 07/16/2020 1200    Physical Exam:    VS:  BP 132/74    Pulse 69    Ht  (1.676 m)    Wt 179 lb 3.2 oz (81.3 kg)    SpO2 98%    BMI 28.92 kg/m     Wt Readings from Last 3  Encounters:  07/27/20 179 lb 3.2 oz (81.3 kg)  07/20/20 181 lb 10.5 oz (82.4 kg)  04/11/20 173 lb 9.6 oz (78.7 kg)     GEN: Well nourished, well developed in no acute distress HEENT: Normal NECK: No JVD; No carotid bruits LYMPHATICS: No lymphadenopathy CARDIAC: S1S2 noted,RRR, no murmurs, rubs, gallops RESPIRATORY:  Clear to auscultation without rales, wheezing or rhonchi  ABDOMEN: Soft, non-tender, non-distended, +bowel sounds, no guarding. EXTREMITIES: No edema, No cyanosis, no clubbing MUSCULOSKELETAL:  No deformity  SKIN: Warm and dry NEUROLOGIC:  Alert and oriented x 3, non-focal PSYCHIATRIC:  Normal affect, good insight  ASSESSMENT:    1. Encounter to establish care   2. Coronary artery disease involving native heart without angina pectoris, unspecified vessel or lesion type   3. Primary hypertension   4. PAD (peripheral artery disease) (HCC)   5. Dyslipidemia    PLAN:     He appears to be stable from a cardiovascular standpoint.  His blood pressure is acceptable in the office today.  No changes will be made to his medication regimen today.  I am going to send refills as the patient has requested.  He does not have a PCP I have advised the patient that is in his best interest to have a PCP.  And I have referred him to a local primary care physician.  I have recommended a 5757-month follow-up but he is not too pleased with this.  Substance abuse cessation advised.  The patient reports that he was unaware of his ingestion of cocaine as he was at a party and did not know it was placed in his drink or on a cup.  Smoking cessation advised as well. The patient is in agreement with the above plan. The  patient left the office in stable condition.  The patient will follow up in 6 months or sooner if needed.   Medication Adjustments/Labs and Tests Ordered: Current medicines are reviewed at length with the patient today.  Concerns regarding medicines are outlined above.  Orders Placed This Encounter  Procedures   [facility]   Meds ordered this encounter  Medications   ranolazine (RANEXA) 500 MG 12 hr tablet    Sig: Take 1 tablet (500 mg total) by mouth 2 (two) times daily.    Dispense:  180 tablet    Refill:  3   nitroGLYCERIN (NITROSTAT) 0.4 MG SL tablet    Sig: Place 1 tablet (0.4 mg total) under the tongue every 5 (five) minutes as needed for chest pain.    Dispense:  30 tablet    Refill:  1   cloNIDine (CATAPRES) 0.1 MG tablet    Sig: Take 1 tablet (0.1 mg total) by mouth 3 (three) times daily.    Dispense:  270 tablet    Refill:  3    Patient Instructions  Medication Instructions:  *If you need a refill on your cardiac medications before your next appointment, please call your pharmacy*  Follow-Up: At [facility], you and your health needs are our priority.  As part of our continuing mission to provide you with exceptional heart care, we have created designated Provider Care Teams.  These Care Teams include your primary Cardiologist (physician) and Advanced Practice Providers (APPs -  Physician Assistants and Nurse Practitioners) who all work together to provide you with the care you need, when you need it.  We recommend signing up for the patient portal called "MyChart".  Sign up information is provided  on this After Visit Summary.  MyChart is used to connect with patients for Virtual Visits (Telemedicine).  Patients are able to view lab/test results, encounter notes, upcoming appointments, etc.  Non-urgent messages can be sent to your provider as well.   To learn more about what you can do with MyChart, go to [email or web address].    Your  next appointment:   Your physician recommends that you schedule a follow-up appointment in: 6 MONTHS with Dr. Servando Salina   The format for your next appointment:   In Person with Thomasene Ripple, DO  A referral to Dr. Baxter Hire [facility]'s office has been sent for you to establish care with a Primary Care Physician. If you do not here form anyone in 10-14 days please call their office.   [facility] [address], [place], [place] [address] [phone]      Adopting a Healthy Lifestyle.  Know what a healthy weight is for you (roughly BMI <25) and aim to maintain this   Aim for 7+ servings of fruits and vegetables daily   65-80+ fluid ounces of water or unsweet tea for healthy kidneys   Limit to max 1 drink of alcohol per day; avoid smoking/tobacco   Limit animal fats in diet for cholesterol and heart health - choose grass fed whenever available   Avoid highly processed foods, and foods high in saturated/trans fats   Aim for low stress - take time to unwind and care for your mental health   Aim for 150 min of moderate intensity exercise weekly for heart health, and weights twice weekly for bone health   Aim for 7-9 hours of sleep daily   When it comes to diets, agreement about the perfect plan isnt easy to find, even among the experts. Experts at the [facility] [employer] developed an idea known as the Healthy Eating Plate. Just imagine a plate divided into logical, healthy portions.   The emphasis is on diet quality:   Load up on vegetables and fruits - one-half of your plate: Aim for color and variety, and remember that potatoes dont count.   Go for whole grains - one-quarter of your plate: Whole wheat, barley, wheat berries, quinoa, oats, brown rice, and foods made with them. If you want pasta, go with whole wheat pasta.   Protein power - one-quarter of your plate: Fish, chicken, beans, and nuts are all healthy, versatile protein sources. Limit red meat.   The diet,  however, does go beyond the plate, offering a few other suggestions.   Use healthy plant oils, such as olive, canola, soy, corn, sunflower and peanut. Check the labels, and avoid partially hydrogenated oil, which have unhealthy trans fats.   If youre thirsty, drink water. Coffee and tea are good in moderation, but skip sugary drinks and limit milk and dairy products to one or two daily servings.   The type of carbohydrate in the diet is more important than the amount. Some sources of carbohydrates, such as vegetables, fruits, whole grains, and beans-are healthier than others.   Finally, stay active  Signed, Thomasene Ripple, DO  07/27/2020 12:05 PM    [facility]

## 2020-07-27 NOTE — Patient Instructions (Addendum)
Medication Instructions:  *If you need a refill on your cardiac medications before your next appointment, please call your pharmacy*  Follow-Up: At [facility], you and your health needs are our priority.  As part of our continuing mission to provide you with exceptional heart care, we have created designated Provider Care Teams.  These Care Teams include your primary Cardiologist (physician) and Advanced Practice Providers (APPs -  Physician Assistants and Nurse Practitioners) who all work together to provide you with the care you need, when you need it.  We recommend signing up for the patient portal called "MyChart".  Sign up information is provided on this After Visit Summary.  MyChart is used to connect with patients for Virtual Visits (Telemedicine).  Patients are able to view lab/test results, encounter notes, upcoming appointments, etc.  Non-urgent messages can be sent to your provider as well.   To learn more about what you can do with MyChart, go to [email or web address].    Your next appointment:   Your physician recommends that you schedule a follow-up appointment in: 6 MONTHS with Dr. Servando Salina   The format for your next appointment:   In Person with Thomasene Ripple, DO  A referral to Dr. Baxter Hire [facility]'s office has been sent for you to establish care with a Primary Care Physician. If you do not here form anyone in 10-14 days please call their office.   [facility] [address], [place], [place] [address] [phone]

## 2020-07-31 ENCOUNTER — Other Ambulatory Visit: Payer: Self-pay

## 2020-07-31 ENCOUNTER — Ambulatory Visit: Payer: Medicare Other | Attending: Internal Medicine

## 2020-07-31 DIAGNOSIS — M25651 Stiffness of right hip, not elsewhere classified: Secondary | ICD-10-CM | POA: Diagnosis present

## 2020-07-31 DIAGNOSIS — M545 Low back pain, unspecified: Secondary | ICD-10-CM | POA: Insufficient documentation

## 2020-07-31 DIAGNOSIS — M62830 Muscle spasm of back: Secondary | ICD-10-CM

## 2020-07-31 DIAGNOSIS — M436 Torticollis: Secondary | ICD-10-CM | POA: Diagnosis not present

## 2020-07-31 DIAGNOSIS — M25652 Stiffness of left hip, not elsewhere classified: Secondary | ICD-10-CM | POA: Diagnosis present

## 2020-07-31 DIAGNOSIS — M2560 Stiffness of unspecified joint, not elsewhere classified: Secondary | ICD-10-CM | POA: Diagnosis present

## 2020-07-31 DIAGNOSIS — G8929 Other chronic pain: Secondary | ICD-10-CM | POA: Diagnosis present

## 2020-07-31 NOTE — Therapy (Addendum)
[place], [place], [address] Phone: [phone]   Fax:  [phone]  Physical Therapy EvaluationDischarge  Patient Details  Name: Cody [name] MRN: [id number] Date of Birth: 1961-08-19 Referring Provider (PT): Sander Radon, MD   Encounter Date: 07/31/2020   PT End of Session - 07/31/20 1155    Visit Number 1    Number of Visits 12    Date for PT Re-Evaluation 09/07/20    Authorization Type MCR A/B    PT Start Time 0830    PT Stop Time 0915    PT Time Calculation (min) 45 min    Activity Tolerance Patient tolerated treatment well;Patient limited by pain    Behavior During Therapy [facility] for tasks assessed/performed           Past Medical History:  Diagnosis Date  . Cocaine abuse ([place]) 09/20/2012  . Coronary artery disease   . Dyslipidemia 09/20/2012  . GERD (gastroesophageal reflux disease)    has stomach ulcers a while back  . HTN (hypertension)   . PAD (peripheral artery disease) ([place]) 09/20/2012  . Smoker 09/20/2012    Past Surgical History:  Procedure Laterality Date  . BIOPSY  07/19/2020   Procedure: BIOPSY;  Surgeon: Jackquline Denmark, MD;  Location: [facility] ENDOSCOPY;  Service: Gastroenterology;;  . CORONARY STENT PLACEMENT    . ESOPHAGOGASTRODUODENOSCOPY (EGD) WITH PROPOFOL N/A 07/19/2020   Procedure: ESOPHAGOGASTRODUODENOSCOPY (EGD) WITH PROPOFOL;  Surgeon: Jackquline Denmark, MD;  Location: [place];  Service: Gastroenterology;  Laterality: N/A;  . INTRAVASCULAR PRESSURE WIRE/FFR STUDY N/A 03/30/2020   Procedure: INTRAVASCULAR PRESSURE WIRE/FFR STUDY;  Surgeon: Burnell Blanks, MD;  Location: [place] CV LAB;  Service: Cardiovascular;  Laterality: N/A;  . INTRAVASCULAR PRESSURE WIRE/FFR STUDY N/A 07/17/2020   Procedure: INTRAVASCULAR PRESSURE WIRE/FFR STUDY;  Surgeon: Leonie Man, MD;  Location: [place] CV LAB;  Service: Cardiovascular;  Laterality: N/A;  . LEFT HEART  CATH AND CORONARY ANGIOGRAPHY N/A 03/30/2020   Procedure: LEFT HEART CATH AND CORONARY ANGIOGRAPHY;  Surgeon: Burnell Blanks, MD;  Location: [place] CV LAB;  Service: Cardiovascular;  Laterality: N/A;  . LEFT HEART CATH AND CORONARY ANGIOGRAPHY N/A 07/17/2020   Procedure: LEFT HEART CATH AND CORONARY ANGIOGRAPHY;  Surgeon: Leonie Man, MD;  Location: [place] CV LAB;  Service: Cardiovascular;  Laterality: N/A;  . LEFT HEART CATHETERIZATION WITH CORONARY ANGIOGRAM N/A 09/24/2012   Procedure: LEFT HEART CATHETERIZATION WITH CORONARY ANGIOGRAM;  Surgeon: Sinclair Grooms, MD;  Location: [facility] CATH LAB;  Service: Cardiovascular;  Laterality: N/A;    There were no vitals filed for this visit.    Subjective Assessment - 07/31/20 0837    Subjective He reports RT leg to foot with numbness and tightness. Feel swollen.   Lt leg gets numb.   MD said may be sciatic nerve.  Rain incr pain   Standing incr pain. Difficulty sitting.  No specific injury.    Limitations Sitting;Standing;Lifting;Walking;House hold activities    How long can you sit comfortably? 30 min    How long can you stand comfortably? 20    How long can you walk comfortably? < 1 block    Diagnostic tests xray plevis    Patient Stated Goals He wants to see if can decrease pain    Currently in Pain? Yes    Pain Score 4     Pain Location Back   RT   Pain Orientation Right;Left    Pain Descriptors / Indicators  Numbness;Sore    Pain Type Chronic pain    Pain Radiating Towards posterior lateral. thigh to foot    Pain Onset More than a month ago    Pain Frequency Constant    Aggravating Factors  sitting most but all can incr pain, rain    Pain Relieving Factors meds              [facility] PT Assessment - 07/31/20 0001      Assessment   Medical Diagnosis RT leg pain and ambulatory dysfunction    Referring Provider (PT) Sander Radon, MD    Onset Date/Surgical Date --   6 months   Next MD Visit None set    Prior  Therapy no      Precautions   Precautions None      Restrictions   Weight Bearing Restrictions No      Balance Screen   Has the patient fallen in the past 6 months No      Prior Function   Level of Independence Independent      Cognition   Overall Cognitive Status Within Functional Limits for tasks assessed      Observation/Other Assessments   Focus on Therapeutic Outcomes (FOTO)  37%  with progected improvement to 48%      Posture/Postural Control   Posture Comments Rt shoulder lower  weight slightly to TLT      ROM / Strength   AROM / PROM / Strength AROM;PROM;Strength      AROM   AROM Assessment Site Lumbar    Lumbar Flexion 70   RT SI to  butt to foot   Lumbar Extension 10   R SI area   Lumbar - Right Side Bend 10    Lumbar - Left Side Bend 15    Lumbar - Right Rotation 20    Lumbar - Left Rotation 40      PROM   Overall PROM Comments T hip flexion WNL but rotation decreased and adduction decreased no significant pain,,   RT hip essentiall the same. but wirh increased pain.       Flexibility   Soft Tissue Assessment /Muscle Length yes    Hamstrings Assisted Lt SLR 45 degrees RT 30 degrees compression of pelvis made lift of RT leg easier    Quadriceps prone PROM 100 deg Lt   100   RT     pron Passive rotation  hips 10-15 degrees bilatera     ITB Mild + on RT       Palpation   Spinal mobility stiffness with PA presure    SI assessment  possible rotation of sacrum LT , possible upslip on LT    Palpation comment Tender along RT SI and gluteals                       Objective measurements completed on examination: See above findings.               PT Education - 07/31/20 1154    Education Details POC , FOTO score reviewed and possible progress    Person(s) Educated Patient    Methods Explanation    Comprehension Verbalized understanding            PT Short Term Goals - 07/31/20 0925      PT SHORT TERM GOAL #1   Title He will be  independent with initial HEp    Time 3    Period Weeks  Status New      PT SHORT TERM GOAL #2   Title He will report pain decreased 20-30% in back and leg    Time 3    Period Weeks    Status New      PT SHORT TERM GOAL #3   Title He will report able ot sit for 15 min without changeing psositions more than  2-3 times .    Time 3    Period Weeks    Status New      PT SHORT TERM GOAL #4   Title FOTO done at visit 5-6 and improved 5-10 points    Time 3    Period Weeks    Status New             PT Long Term Goals - 07/31/20 1029      PT LONG TERM GOAL #1   Title He will be independent with all HEP issued    Time 6    Period Weeks    Status New      PT LONG TERM GOAL #2   Title He will eport pain in leg now intermittant and able to ease leg pain when present    Time 6    Period Weeks    Status New      PT LONG TERM GOAL #3   Title FOTO score improved to 48%    Time 6    Period Weeks    Status New      PT LONG TERM GOAL #4   Title He will report able to sit equally on each hip without need for  shifting for 30 min due to pain    Time 6    Period Weeks    Status New      PT LONG TERM GOAL #5   Title He will report able to walk 2 blocks without increased pain    Time 6    Period Weeks    Status New                  Plan - 07/31/20 3202    Clinical Impression Statement MR Lynch presents with complaint of RT > LT ack and leg pain RT leg with pain and numbness posterior RT legg to foot. He is not able to walk or sit and put pressure on RT side without increased pain  No  position really eased pain but extension positions did not increase RT leg pain. He had les pain and greater ease with RT SLR after pelvic compression so he may have an SI component to his pain. Due to time we were unable to issue any exercises but a good HEP and compliance should help ease pain. He will be efit from skilled PT to decr pain and progress exercise    Personal Factors and  Comorbidities Time since onset of injury/illness/exacerbation;Past/Current Experience;Comorbidity 1;Comorbidity 2    Comorbidities MI angina, cardiac cath with stents    Examination-Activity Limitations Dressing;Stand;Carry;Sit;Bend;Reach Overhead;Locomotion Level    Examination-Participation Restrictions Community Activity;Cleaning;Meal Prep;Driving;Laundry;Shop    Stability/Clinical Decision Making Evolving/Moderate complexity    Clinical Decision Making Moderate    Rehab Potential Good    PT Frequency 2x / week    PT Duration 6 weeks    PT Treatment/Interventions Taping;Dry needling;Passive range of motion;Manual techniques;Therapeutic exercise;Therapeutic activities;Patient/family education;Iontophoresis 34m/ml Dexamethasone;Electrical Stimulation;Moist Heat    PT Next Visit Plan Initiate HEP ., Trial SI belt.    modalities, manual  prone positioning  and assess pain response    Consulted and Agree with Plan of Care Patient           Patient will benefit from skilled therapeutic intervention in order to improve the following deficits and impairments:  Pain, Decreased activity tolerance, Increased muscle spasms, Difficulty walking, Decreased range of motion  Visit Diagnosis: Chronic right-sided low back pain, unspecified whether sciatica present - Plan: PT plan of care cert/re-cert  Stiffness of right hip, not elsewhere classified - Plan: PT plan of care cert/re-cert  Stiffness of left hip, not elsewhere classified - Plan: PT plan of care cert/re-cert  Muscle spasm of back - Plan: PT plan of care cert/re-cert  Joint stiffness of spine - Plan: PT plan of care cert/re-cert     Problem List Patient Active Problem List   Diagnosis Date Noted  . Hiatal hernia   . Ambulatory dysfunction 07/17/2020  . CAD (coronary artery disease) 05/30/2020  . Sciatica of right side 05/30/2020  . Hypokalemia   . CAP (community acquired pneumonia) 03/31/2020  . Groin pain, chronic, right   .  Leukocytosis   . PUD (peptic ulcer disease)   . Chronic chest pain with high risk for CAD   . Positive cardiac stress test 03/29/2020  . Chest pain 09/24/2012    Class: Acute  . Retinal vein occlusion 09/24/2012  . Constipation 09/22/2012  . Visual field defect 09/21/2012  . HTN (hypertension) 09/20/2012  . Smoker 09/20/2012  . PAD (peripheral artery disease) ([place]) 09/20/2012  . Cocaine abuse ([place]) 09/20/2012  . Dyslipidemia 09/20/2012    Darrel Hoover  PT 07/31/2020, 12:11 PM  [facility] [address] [place], [place], [address] Phone: [phone]   Fax:  [phone]  Name: Eldor Conaway MRN: [id number] Date of Birth: 06-07-1961 PHYSICAL THERAPY DISCHARGE SUMMARY  Visits from Start of Care:   Current functional level related to goals / functional outcomes: Unknown as he did not return after eval canceling x 2 and no showing x 3   Remaining deficits: Unknown   Education / Equipment: NA  Plan:                                                    Patient goals were not met. Patient is being discharged due to not returning since the last visit.  ?????    Pearson Forster PT 09/04/20

## 2020-08-14 ENCOUNTER — Ambulatory Visit: Payer: Medicare Other

## 2020-08-21 ENCOUNTER — Ambulatory Visit: Payer: Medicare Other | Attending: Internal Medicine

## 2020-08-23 ENCOUNTER — Ambulatory Visit: Payer: Medicare Other

## 2020-08-23 ENCOUNTER — Telehealth: Payer: Self-pay | Admitting: Physical Therapy

## 2020-08-23 NOTE — Telephone Encounter (Signed)
Attempted to contact pt after his second no show but the call was  not able to be completed  "as dialed" by [employer].

## 2020-08-28 ENCOUNTER — Ambulatory Visit: Payer: Medicare Other

## 2020-09-04 ENCOUNTER — Ambulatory Visit: Payer: Medicare Other

## 2020-09-04 ENCOUNTER — Telehealth: Payer: Self-pay | Admitting: Physical Therapy

## 2020-09-04 NOTE — Telephone Encounter (Signed)
Attempted to call Cody [name] due to no show today's appointment. A message from [employer] that the call could not be completed as dialed. The number used was the number we have on file.

## 2020-09-06 ENCOUNTER — Ambulatory Visit: Payer: Medicare Other

## 2020-11-14 ENCOUNTER — Other Ambulatory Visit: Payer: Self-pay | Admitting: Cardiology

## 2021-01-03 ENCOUNTER — Other Ambulatory Visit: Payer: Self-pay

## 2021-01-03 ENCOUNTER — Telehealth: Payer: Self-pay | Admitting: Cardiology

## 2021-01-03 MED ORDER — AMLODIPINE BESYLATE 10 MG PO TABS
10.0000 mg | ORAL_TABLET | Freq: Every day | ORAL | 3 refills | Status: DC
Start: 2021-01-03 — End: 2021-04-25

## 2021-01-03 MED ORDER — RANOLAZINE ER 500 MG PO TB12
500.0000 mg | ORAL_TABLET | Freq: Two times a day (BID) | ORAL | 4 refills | Status: DC
Start: 2021-01-03 — End: 2021-04-25

## 2021-01-03 MED ORDER — EZETIMIBE 10 MG PO TABS: 10.0000 mg | ORAL_TABLET | Freq: Every day | ORAL | 3 refills | Status: DC

## 2021-01-03 MED ORDER — ATORVASTATIN CALCIUM 80 MG PO TABS: 80.0000 mg | ORAL_TABLET | Freq: Every day | ORAL | 3 refills | Status: DC

## 2021-01-03 MED ORDER — ASPIRIN 81 MG PO TBEC
81.0000 mg | DELAYED_RELEASE_TABLET | Freq: Every day | ORAL | 3 refills | Status: DC
Start: 2021-01-03 — End: 2021-04-25

## 2021-01-03 MED ORDER — NITROGLYCERIN 0.4 MG SL SUBL: 0.4000 mg | SUBLINGUAL_TABLET | SUBLINGUAL | 1 refills | Status: DC | PRN

## 2021-01-03 MED ORDER — CARVEDILOL 12.5 MG PO TABS
12.5000 mg | ORAL_TABLET | Freq: Two times a day (BID) | ORAL | 3 refills | Status: DC
Start: 2021-01-03 — End: 2021-04-25

## 2021-01-03 NOTE — Telephone Encounter (Signed)
Pt needs refills for the following   Ranolazine Ezetimibe Aspirin Atorvastatin Amlodipine Carvedilol Pantoprazole   From reviewing pt's med list it would appear Dr. Servando Salina didn't prescribe all of these but her name is on the refill bottle. Please assist!  Best number to contact- [phone]  Still uses [facility] in [place]  Thank you!  Cody [name]

## 2021-01-03 NOTE — Telephone Encounter (Signed)
Spoke with patient to let him know the refill was sent to pharmacy.

## 2021-01-04 NOTE — Telephone Encounter (Signed)
Spoke with patient yesterday. He is aware the refills were sent to his pharmacy. When asked if he has a PCP established, he states "I only have the heart thing and eye stuff going on, I don't need a primary doctor." He verbalized understanding about the prescriptions being sent.

## 2021-01-14 DIAGNOSIS — I2510 Atherosclerotic heart disease of native coronary artery without angina pectoris: Secondary | ICD-10-CM | POA: Insufficient documentation

## 2021-01-14 DIAGNOSIS — K219 Gastro-esophageal reflux disease without esophagitis: Secondary | ICD-10-CM | POA: Insufficient documentation

## 2021-01-15 ENCOUNTER — Ambulatory Visit (INDEPENDENT_AMBULATORY_CARE_PROVIDER_SITE_OTHER): Payer: Medicare Other | Admitting: Cardiology

## 2021-01-15 ENCOUNTER — Ambulatory Visit (INDEPENDENT_AMBULATORY_CARE_PROVIDER_SITE_OTHER): Payer: Medicare Other

## 2021-01-15 ENCOUNTER — Other Ambulatory Visit: Payer: Self-pay

## 2021-01-15 ENCOUNTER — Encounter: Payer: Self-pay | Admitting: Cardiology

## 2021-01-15 VITALS — BP 128/86 | HR 70 | Ht 66.0 in | Wt 178.2 lb

## 2021-01-15 DIAGNOSIS — R002 Palpitations: Secondary | ICD-10-CM

## 2021-01-15 DIAGNOSIS — E785 Hyperlipidemia, unspecified: Secondary | ICD-10-CM

## 2021-01-15 DIAGNOSIS — I2510 Atherosclerotic heart disease of native coronary artery without angina pectoris: Secondary | ICD-10-CM | POA: Diagnosis not present

## 2021-01-15 DIAGNOSIS — I10 Essential (primary) hypertension: Secondary | ICD-10-CM

## 2021-01-15 DIAGNOSIS — I739 Peripheral vascular disease, unspecified: Secondary | ICD-10-CM
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
## 2021-01-15 NOTE — Patient Instructions (Addendum)
Medication Instructions:  Your physician recommends that you continue on your current medications as directed. Please refer to the Current Medication list given to you today.  *If you need a refill on your cardiac medications before your next appointment, please call your pharmacy*   Lab Work: None If you have labs (blood work) drawn today and your tests are completely normal, you will receive your results only by: Marland Kitchen MyChart Message (if you have MyChart) OR . A paper copy in the mail If you have any lab test that is abnormal or we need to change your treatment, we will call you to review the results.   Testing/Procedures: A zio monitor was ordered today. It will remain on for 7 days. You will then return monitor and event diary in provided box. It takes 1-2 weeks for report to be downloaded and returned to [place]. We will call you with the results. If monitor falls off or has orange flashing light, please call Zio for further instructions.     Follow-Up: At [facility], you and your health needs are our priority.  As part of our continuing mission to provide you with exceptional heart care, we have created designated Provider Care Teams.  These Care Teams include your primary Cardiologist (physician) and Advanced Practice Providers (APPs -  Physician Assistants and Nurse Practitioners) who all work together to provide you with the care you need, when you need it.  We recommend signing up for the patient portal called "MyChart".  Sign up information is provided on this After Visit Summary.  MyChart is used to connect with patients for Virtual Visits (Telemedicine).  Patients are able to view lab/test results, encounter notes, upcoming appointments, etc.  Non-urgent messages can be sent to your provider as well.   To learn more about what you can do with MyChart, go to [email or web address].    Your next appointment:   6 month(s)  The format for your next appointment:   In  Person  Provider:   Thomasene Ripple, DO   Other Instructions  WHY IS MY DOCTOR PRESCRIBING ZIO? The Zio system is proven and trusted by physicians to detect and diagnose irregular heart rhythms -- and has been prescribed to hundreds of thousands of patients.  The FDA has cleared the Zio system to monitor for many different kinds of irregular heart rhythms. In a study, physicians were able to reach a diagnosis 90% of the time with the Zio system1.  You can wear the Zio monitor -- a small, discreet, comfortable patch -- during your normal day-to-day activity, including while you sleep, shower, and exercise, while it records every single heartbeat for analysis.  1Barrett, P., et al. Comparison of 24 Hour Holter Monitoring Versus 14 Day Novel Adhesive Patch Electrocardiographic Monitoring. American Journal of Medicine, 2014.  ZIO VS. HOLTER MONITORING The Zio monitor can be comfortably worn for up to 14 days. Holter monitors can be worn for 24 to 48 hours, limiting the time to record any irregular heart rhythms you may have. Zio is able to capture data for the 51% of patients who have their first symptom-triggered arrhythmia after 48 hours.1  LIVE WITHOUT RESTRICTIONS The Zio ambulatory cardiac monitor is a small, unobtrusive, and water-resistant patch--you might even forget you're wearing it. The Zio monitor records and stores every beat of your heart, whether you're sleeping, working out, or showering.

## 2021-04-25 ENCOUNTER — Telehealth: Payer: Self-pay | Admitting: Cardiology

## 2021-04-25 MED ORDER — ASPIRIN 81 MG PO TBEC
81.0000 mg | DELAYED_RELEASE_TABLET | Freq: Every day | ORAL | 3 refills | Status: DC
Start: 2021-04-25 — End: 2021-08-13

## 2021-04-25 MED ORDER — CLONIDINE HCL 0.1 MG PO TABS
0.1000 mg | ORAL_TABLET | Freq: Three times a day (TID) | ORAL | 3 refills | Status: DC
Start: 2021-04-25 — End: 2021-11-14

## 2021-04-25 MED ORDER — RANOLAZINE ER 500 MG PO TB12
500.0000 mg | ORAL_TABLET | Freq: Two times a day (BID) | ORAL | 3 refills | Status: DC
Start: 2021-04-25 — End: 2021-11-14

## 2021-04-25 MED ORDER — AMLODIPINE BESYLATE 10 MG PO TABS: 10.0000 mg | ORAL_TABLET | Freq: Every day | ORAL | 3 refills | Status: DC

## 2021-04-25 MED ORDER — CARVEDILOL 12.5 MG PO TABS
12.5000 mg | ORAL_TABLET | Freq: Two times a day (BID) | ORAL | 3 refills | Status: DC
Start: 2021-04-25 — End: 2021-08-13

## 2021-04-25 MED ORDER — ATORVASTATIN CALCIUM 80 MG PO TABS: 80.0000 mg | ORAL_TABLET | Freq: Every day | ORAL | 3 refills | Status: DC

## 2021-04-25 MED ORDER — EZETIMIBE 10 MG PO TABS: 10.0000 mg | ORAL_TABLET | Freq: Every day | ORAL | 3 refills | Status: DC

## 2021-04-25 NOTE — Telephone Encounter (Signed)
*  STAT* If patient is at the pharmacy, call can be transferred to refill team.   1. Which medications need to be refilled? (please list name of each medication and dose if known) Asprin, ranolazine, carvedilol, clonidine, atorvastatin, amlodipine ezetimibe  2. Which pharmacy/location (including street and city if local pharmacy) is medication to be sent to? [facility] [place]   3. Do they need a 30 day or 90 day supply? 90 not sure.    Patient is out of medication

## 2021-04-25 NOTE — Telephone Encounter (Signed)
Refill sent in per request.  

## 2021-08-13 ENCOUNTER — Telehealth: Payer: Self-pay | Admitting: Cardiology

## 2021-08-13 ENCOUNTER — Other Ambulatory Visit: Payer: Self-pay

## 2021-08-13 MED ORDER — EZETIMIBE 10 MG PO TABS: 10.0000 mg | ORAL_TABLET | Freq: Every day | ORAL | 3 refills | Status: DC

## 2021-08-13 MED ORDER — ATORVASTATIN CALCIUM 80 MG PO TABS: 80.0000 mg | ORAL_TABLET | Freq: Every day | ORAL | 3 refills | Status: DC

## 2021-08-13 MED ORDER — AMLODIPINE BESYLATE 10 MG PO TABS: 10.0000 mg | ORAL_TABLET | Freq: Every day | ORAL | 3 refills | Status: DC

## 2021-08-13 MED ORDER — NITROGLYCERIN 0.4 MG SL SUBL: 0.4000 mg | SUBLINGUAL_TABLET | SUBLINGUAL | 6 refills | Status: DC | PRN

## 2021-08-13 MED ORDER — CARVEDILOL 12.5 MG PO TABS: 12.5000 mg | ORAL_TABLET | Freq: Two times a day (BID) | ORAL | 3 refills | Status: DC

## 2021-08-13 MED ORDER — ASPIRIN 81 MG PO TBEC
81.0000 mg | DELAYED_RELEASE_TABLET | Freq: Every day | ORAL | 3 refills | Status: DC
Start: 2021-08-13 — End: 2021-11-14

## 2021-08-13 NOTE — Telephone Encounter (Signed)
Pt states that he is not taking Clonidine as he was having dizziness when standing and since stopping he feels better. I advised pt to keep a record of his BP 1-2 hours after his medication to see if his BP is controlled. Pt verbalized understanding and will let us know later this week his BP readings.

## 2021-08-13 NOTE — Telephone Encounter (Signed)
Pt c/o medication issue:  1. Name of Medication:  cloNIDine (CATAPRES) 0.1 MG tablet  2. How are you currently taking this medication (dosage and times per day)? Not currently taking   3. Are you having a reaction (difficulty breathing--STAT)? Yes  4. What is your medication issue? Akeen stopped taking this medication in the middle of October due to it causing dizziness to the point of feeling he was going to pass out. He has not had any of these symptoms since stopping. He is scheduled 10/03/21 to see Dr. Tomie China in regards to this due to being unable to follow Dr. Servando Salina to [place].

## 2021-08-13 NOTE — Telephone Encounter (Signed)
*  STAT* If patient is at the pharmacy, call can be transferred to refill team.   1. Which medications need to be refilled? (please list name of each medication and dose if known)  ezetimibe (ZETIA) 10 MG tablet (Expired) carvedilol (COREG) 12.5 MG tablet aspirin 81 MG EC tablet amLODipine (NORVASC) 10 MG tablet ranolazine (RANEXA) 500 MG 12 hr tablet atorvastatin (LIPITOR) 80 MG tablet (Expired)  2. Which pharmacy/location (including street and city if local pharmacy) is medication to be sent to? [id number] [place] [place] - [address] [facility]  3. Do they need a 30 day or 90 day supply? 90 day supply

## 2021-10-03 ENCOUNTER — Ambulatory Visit: Payer: Medicare Other | Admitting: Cardiology

## 2021-10-23 ENCOUNTER — Ambulatory Visit: Payer: Medicare Other | Admitting: Cardiology

## 2021-11-14 ENCOUNTER — Telehealth: Payer: Self-pay | Admitting: Cardiology

## 2021-11-14 MED ORDER — RANOLAZINE ER 500 MG PO TB12: 500.0000 mg | ORAL_TABLET | Freq: Two times a day (BID) | ORAL | 0 refills | Status: DC

## 2021-11-14 MED ORDER — ASPIRIN 81 MG PO TBEC: 81.0000 mg | DELAYED_RELEASE_TABLET | Freq: Every day | ORAL | 0 refills | Status: DC

## 2021-11-14 MED ORDER — EZETIMIBE 10 MG PO TABS: 10.0000 mg | ORAL_TABLET | Freq: Every day | ORAL | 0 refills | Status: DC

## 2021-11-14 MED ORDER — ATORVASTATIN CALCIUM 80 MG PO TABS: 80.0000 mg | ORAL_TABLET | Freq: Every day | ORAL | 0 refills | Status: DC

## 2021-11-14 MED ORDER — AMLODIPINE BESYLATE 10 MG PO TABS: 10.0000 mg | ORAL_TABLET | Freq: Every day | ORAL | 0 refills | Status: DC

## 2021-11-14 MED ORDER — CARVEDILOL 12.5 MG PO TABS: 12.5000 mg | ORAL_TABLET | Freq: Two times a day (BID) | ORAL | 0 refills | Status: DC

## 2021-11-14 MED ORDER — CLONIDINE HCL 0.1 MG PO TABS: 0.1000 mg | ORAL_TABLET | Freq: Three times a day (TID) | ORAL | 0 refills | Status: DC

## 2021-11-14 NOTE — Telephone Encounter (Signed)
° ° °*  STAT* If patient is at the pharmacy, call can be transferred to refill team.   1. Which medications need to be refilled? (please list name of each medication and dose if known) amLODipine (NORVASC) 10 MG tablet  aspirin 81 MG EC tablet    atorvastatin (LIPITOR) 80 MG tablet   carvedilol (COREG) 12.5 MG tablet cloNIDine (CATAPRES) 0.1 MG tablet  ezetimibe (ZETIA) 10 MG tablet   ranolazine (RANEXA) 500 MG 12 hr tablet  2. Which pharmacy/location (including street and city if local pharmacy) is medication to be sent to?[id number] [place], [place] - [address] [facility]  3. Do they need a 30 day or 90 day supply? 90 days  Pt only need 60 days supply for ranolazine (RANEXA) 500 MG 12 hr tablet

## 2021-12-26 ENCOUNTER — Telehealth: Payer: Self-pay | Admitting: Cardiology

## 2021-12-26 NOTE — Telephone Encounter (Signed)
Pt c/o medication issue: ? ?1. Name of Medication:  ?cloNIDine (CATAPRES) 0.1 MG tablet ? ?2. How are you currently taking this medication (dosage and times per day)?  ?Patient has been taking 1 tablet 2X daily ? ?3. Are you having a reaction (difficulty breathing--STAT)?  ? ?4. What is your medication issue?  ? ?Patient states this medication has been causing dizziness. He states he even cut back and started only taking 2 tablets daily, but he has still been dizzy. ?He also mentioned numbness on the left side of his face.numbness developed last week and lasts for a few minutes then goes away. He denies droopiness in his face. States he has also had a little numbness in the left side of his neck and throat.  ? ?

## 2021-12-26 NOTE — Telephone Encounter (Signed)
Returned call to patient regarding symptoms that he attributes to Clonidine. Pt states that he started taking Clonidine again in December, but at the dose of 0.1mg  twice daily (our records show TID) due to experiencing intermittent episodes of balance issues. Pt denies that it's dizziness, but states that "it just comes on me and I feel like I'm going to need to catch myself." Pt initially stated that he feels episodes like this every day, but then states not every day, "just every now and then." Pt does not check BP at home and had no readings to provide. Asked pt to check pressure on phone. Pt had been up walking and moving around, then changed batteries, so reading is expected to be slightly elevated, but provided 150/70 on his R arm. Pt does state that sometimes he "feels a feeling in my neck and face on the L side, like its numb and it just comes and then goes." Pt cannot verify the frequency of these episodes of numbness and is unaware of his BP at time of events. Pt denies changes in speech or ambulation in association with feelings of numbness. Questioned pt about recent changes in vision and was told "you know because my eye, my sight is always blurry." Pt attests that he thinks his vision may be worse lately, but wasn't completely sure. Advised pt that he should be checking his BP daily 1-2 hours after taking his medication to rule out hypotension causes. Will route to MD for review. ED precautions given. ?

## 2021-12-30 NOTE — Telephone Encounter (Signed)
Spoke with pt on 12/26/21 and advised to keep BP log and advised to go the ED for repeat numbness. I discussed the stroke sx an importance of early recognition and treatment. Pt verbalized understanding and had no questions. ?

## 2022-02-06 ENCOUNTER — Encounter: Payer: Self-pay | Admitting: Cardiology

## 2022-02-06 ENCOUNTER — Ambulatory Visit (INDEPENDENT_AMBULATORY_CARE_PROVIDER_SITE_OTHER): Payer: Medicare Other | Admitting: Cardiology

## 2022-02-06 VITALS — BP 130/70 | HR 68 | Ht 66.0 in | Wt 180.6 lb

## 2022-02-06 DIAGNOSIS — F17210 Nicotine dependence, cigarettes, uncomplicated: Secondary | ICD-10-CM | POA: Diagnosis not present

## 2022-02-06 DIAGNOSIS — I739 Peripheral vascular disease, unspecified: Secondary | ICD-10-CM | POA: Diagnosis not present

## 2022-02-06 DIAGNOSIS — I10 Essential (primary) hypertension: Secondary | ICD-10-CM | POA: Diagnosis not present

## 2022-02-06 DIAGNOSIS — F17200 Nicotine dependence, unspecified, uncomplicated: Secondary | ICD-10-CM

## 2022-02-06 DIAGNOSIS — Z131 Encounter for screening for diabetes mellitus: Secondary | ICD-10-CM

## 2022-02-06 DIAGNOSIS — E785 Hyperlipidemia, unspecified: Secondary | ICD-10-CM | POA: Diagnosis not present

## 2022-02-06 DIAGNOSIS — I2510 Atherosclerotic heart disease of native coronary artery without angina pectoris: Secondary | ICD-10-CM | POA: Diagnosis not present

## 2022-02-06 MED ORDER — RANOLAZINE ER 500 MG PO TB12: 500.0000 mg | ORAL_TABLET | Freq: Two times a day (BID) | ORAL | 3 refills | Status: DC

## 2022-02-06 MED ORDER — PANTOPRAZOLE SODIUM 40 MG PO TBEC: 40.0000 mg | DELAYED_RELEASE_TABLET | Freq: Every day | ORAL | 3 refills | Status: DC

## 2022-02-06 NOTE — Addendum Note (Signed)
Addended by: Eleonore Chiquito on: 02/06/2022 10:54 AM ? ? Modules accepted: Orders ? ?

## 2022-02-06 NOTE — Patient Instructions (Signed)
Medication Instructions:  ?Your physician recommends that you continue on your current medications as directed. Please refer to the Current Medication list given to you today. ? ?*If you need a refill on your cardiac medications before your next appointment, please call your pharmacy* ? ? ?Lab Work: ?Your physician recommends that you return for lab work in: the next few days. ? ?You need to have labs done when you are fasting.  You can come Monday through Friday 8:30 am to 12:00 pm and 1:15 to 4:30. You do not need to make an appointment as the order has already been placed. The labs you are going to have done are BMET, CBC, TSH, LFT and Lipids. ? ?If you have labs (blood work) drawn today and your tests are completely normal, you will receive your results only by: ?MyChart Message (if you have MyChart) OR ?A paper copy in the mail ?If you have any lab test that is abnormal or we need to change your treatment, we will call you to review the results. ? ? ?Testing/Procedures: ?Your physician has requested that you have a lexiscan myoview. For further information please visit [email or web address] Please follow instruction sheet, as given. ? ?The test will take approximately 3 to 4 hours to complete; you may bring reading material.  If someone comes with you to your appointment, they will need to remain in the main lobby due to limited space in the testing area. **If you are pregnant or breastfeeding, please notify the nuclear lab prior to your appointment** ? ?How to prepare for your Myocardial Perfusion Test: ?Do not eat or drink 3 hours prior to your test, except you may have water. ?Do not consume products containing caffeine (regular or decaffeinated) 12 hours prior to your test. (ex: coffee, chocolate, sodas, tea). ?Do bring a list of your current medications with you.  If not listed below, you may take your medications as normal. ?Do wear comfortable clothes (no dresses or overalls) and walking shoes, tennis  shoes preferred (No heels or open toe shoes are allowed). ?Do NOT wear cologne, perfume, aftershave, or lotions (deodorant is allowed). ?If these instructions are not followed, your test will have to be rescheduled. ? ? ? ?Follow-Up: ?At [facility], you and your health needs are our priority.  As part of our continuing mission to provide you with exceptional heart care, we have created designated Provider Care Teams.  These Care Teams include your primary Cardiologist (physician) and Advanced Practice Providers (APPs -  Physician Assistants and Nurse Practitioners) who all work together to provide you with the care you need, when you need it. ? ?We recommend signing up for the patient portal called "MyChart".  Sign up information is provided on this After Visit Summary.  MyChart is used to connect with patients for Virtual Visits (Telemedicine).  Patients are able to view lab/test results, encounter notes, upcoming appointments, etc.  Non-urgent messages can be sent to your provider as well.   ?To learn more about what you can do with MyChart, go to [email or web address].   ? ?Your next appointment:   ?9 month(s) ? ?The format for your next appointment:   ?In Person ? ?Provider:   ?Belva Crome, MD ? ? ?Other Instructions ?Cardiac Nuclear Scan ?A cardiac nuclear scan is a test that is done to check the flow of blood to your heart. It is done when you are resting and when you are exercising. The test looks for problems such as: ?Not enough blood reaching a portion  of the heart. ?The heart muscle not working as it should. ?You may need this test if: ?You have heart disease. ?You have had lab results that are not normal. ?You have had heart surgery or a balloon procedure to open up blocked arteries (angioplasty). ?You have chest pain. ?You have shortness of breath. ?In this test, a special dye (tracer) is put into your bloodstream. The tracer will travel to your heart. A camera will then take pictures of your  heart to see how the tracer moves through your heart. This test is usually done at a hospital and takes 2-4 hours. ?Tell a doctor about: ?Any allergies you have. ?All medicines you are taking, including vitamins, herbs, eye drops, creams, and over-the-counter medicines. ?Any problems you or family members have had with anesthetic medicines. ?Any blood disorders you have. ?Any surgeries you have had. ?Any medical conditions you have. ?Whether you are pregnant or may be pregnant. ?What are the risks? ?Generally, this is a safe test. However, problems may occur, such as: ?Serious chest pain and heart attack. This is only a risk if the stress portion of the test is done. ?Rapid heartbeat. ?A feeling of warmth in your chest. This feeling usually does not last long. ?Allergic reaction to the tracer. ?What happens before the test? ?Ask your doctor about changing or stopping your normal medicines. This is important. ?Follow instructions from your doctor about what you cannot eat or drink. ?Remove your jewelry on the day of the test. ?What happens during the test? ?An IV tube will be inserted into one of your veins. ?Your doctor will give you a small amount of tracer through the IV tube. ?You will wait for 20-40 minutes while the tracer moves through your bloodstream. ?Your heart will be monitored with an electrocardiogram (ECG). ?You will lie down on an exam table. ?Pictures of your heart will be taken for about 15-20 minutes. ?You may also have a stress test. For this test, one of these things may be done: ?You will be asked to exercise on a treadmill or a stationary bike. ?You will be given medicines that will make your heart work harder. This is done if you are unable to exercise. ?When blood flow to your heart has peaked, a tracer will again be given through the IV tube. ?After 20-40 minutes, you will get back on the exam table. More pictures will be taken of your heart. ?Depending on the tracer that is used, more  pictures may need to be taken 3-4 hours later. ?Your IV tube will be removed when the test is over. ?The test may vary among doctors and hospitals. ?What happens after the test? ?Ask your doctor: ?Whether you can return to your normal schedule, including diet, activities, and medicines. ?Whether you should drink more fluids. This will help to remove the tracer from your body. Drink enough fluid to keep your pee (urine) pale yellow. ?Ask your doctor, or the department that is doing the test: ?When will my results be ready? ?How will I get my results? ?Summary ?A cardiac nuclear scan is a test that is done to check the flow of blood to your heart. ?Tell your doctor whether you are pregnant or may be pregnant. ?Before the test, ask your doctor about changing or stopping your normal medicines. This is important. ?Ask your doctor whether you can return to your normal activities. You may be asked to drink more fluids. ?This information is not intended to replace advice given to you by  your health care provider. Make sure you discuss any questions you have with your health care provider. ?Document Revised: 01/12/2019 Document Reviewed: 03/08/2018 ?Elsevier Patient Education ? 2021 Elsevier Inc. ? ? ? ?

## 2022-02-06 NOTE — Progress Notes (Signed)
?Cardiology Office Note:   ? ?Date:  02/06/2022  ? ?ID:  Cody [name], DOB 04/03/1961, MRN [id number] ? ?PCP:  Ailene RavelHamrick, Maura L, MD  ?Cardiologist:  Garwin Brothersajan R Daiel Strohecker, MD  ? ?Referring MD: Ailene RavelHamrick, Maura L, MD  ? ? ?ASSESSMENT:   ? ?1. Coronary artery disease involving native coronary artery of native heart, unspecified whether angina present   ?2. Dyslipidemia   ?3. PAD (peripheral artery disease) (HCC)   ?4. Essential hypertension   ?5. Diabetes mellitus screening   ?6. Coronary artery disease involving native coronary artery of native heart without angina pectoris   ?7. Primary hypertension   ?8. Smoker   ? ?PLAN:   ? ?In order of problems listed above: ? ?Coronary artery disease: Secondary prevention stressed with the patient.  Importance of compliance with diet medication stressed and he vocalized understanding.  He was advised to walk at least half an hour a day 5 days a week and he promises to do so. ?Essential hypertension: Blood pressure stable and diet was emphasized.  Lifestyle modification urged. ?Mixed dyslipidemia: Lipids were reviewed I told him to come back for lipid work so we can update his medications and make sure he is on guideline directed medical therapy. ?Cigarette smoker: I spent 5 minutes with the patient discussing solely about smoking. Smoking cessation was counseled. I suggested to the patient also different medications and pharmacological interventions. Patient is keen to try stopping on its own at this time. He will get back to me if he needs any further assistance in this matter.  I I also told him to stay away from any drug use and risks explained. ?Erectile dysfunction: I told him that if he needs medications for this he would need to basically get established with primary care and get an evaluation first for erectile dysfunction such as evaluating testosterone levels and such.  I also recommended Lexiscan sestamibi to assess for any objective evidence of coronary artery disease in  view of his continued high risk behavior.  If this is done and satisfactory then we could consider medications for erectile dysfunction.  He understands this.  Questions were answered to his satisfaction. ?Patient will be seen in follow-up appointment in 6 months or earlier if the patient has any concerns ? ? ? ?Medication Adjustments/Labs and Tests Ordered: ?Current medicines are reviewed at length with the patient today.  Concerns regarding medicines are outlined above.  ?Orders Placed This Encounter  ?Procedures  ? Basic metabolic panel  ? CBC with Differential/Platelet  ? Hepatic function panel  ? Lipid panel  ? TSH  ? MYOCARDIAL PERFUSION IMAGING  ? EKG 12-Lead  ? ?Meds ordered this encounter  ?Medications  ? pantoprazole (PROTONIX) 40 MG tablet  ?  Sig: Take 1 tablet (40 mg total) by mouth daily.  ?  Dispense:  90 tablet  ?  Refill:  3  ? ranolazine (RANEXA) 500 MG 12 hr tablet  ?  Sig: Take 1 tablet (500 mg total) by mouth 2 (two) times daily.  ?  Dispense:  180 tablet  ?  Refill:  3  ? ? ? ?No chief complaint on file. ?  ? ?History of Present Illness:   ? ?Cody [name] is a 61 y.o. male.  Patient is previously unknown to me.  He used to see my partner who has relocated to [place].  Patient has known coronary artery disease, essential hypertension, dyslipidemia and history of smoking.  Unfortunately continues to smoke.  The chart mentions history  of cocaine abuse.  At the time of my evaluation, the patient is alert awake oriented and in no distress.  He leads a sedentary lifestyle. ? ?Past Medical History:  ?Diagnosis Date  ? Ambulatory dysfunction 07/17/2020  ? CAD (coronary artery disease) 05/30/2020  ? CAP (community acquired pneumonia) 03/31/2020  ? Chest pain 09/24/2012  ? Nuclear study normal the patient demanded coronary angiography because of prior false-negative studies  ? Chronic chest pain with high risk for CAD   ? Cocaine abuse (HCC) 09/20/2012  ? Constipation 09/22/2012  ? Coronary artery  disease   ? Dyslipidemia 09/20/2012  ? GERD (gastroesophageal reflux disease)   ? has stomach ulcers a while back  ? Groin pain, chronic, right   ? Hiatal hernia   ? HTN (hypertension)   ? Hypokalemia   ? Leukocytosis   ? PAD (peripheral artery disease) (HCC) 09/20/2012  ? Positive cardiac stress test 03/29/2020  ? PUD (peptic ulcer disease)   ? Retinal vein occlusion 09/24/2012  ? Sciatica of right side 05/30/2020  ? Smoker 09/20/2012  ? Visual field defect 09/21/2012  ? ? ?Past Surgical History:  ?Procedure Laterality Date  ? BIOPSY  07/19/2020  ? Procedure: BIOPSY;  Surgeon: Lynann Bologna, MD;  Location: [facility] ENDOSCOPY;  Service: Gastroenterology;;  ? CORONARY STENT PLACEMENT    ? ESOPHAGOGASTRODUODENOSCOPY (EGD) WITH PROPOFOL N/A 07/19/2020  ? Procedure: ESOPHAGOGASTRODUODENOSCOPY (EGD) WITH PROPOFOL;  Surgeon: Lynann Bologna, MD;  Location: [facility] ENDOSCOPY;  Service: Gastroenterology;  Laterality: N/A;  ? INTRAVASCULAR PRESSURE WIRE/FFR STUDY N/A 03/30/2020  ? Procedure: INTRAVASCULAR PRESSURE WIRE/FFR STUDY;  Surgeon: Kathleene Hazel, MD;  Location: [facility] INVASIVE CV LAB;  Service: Cardiovascular;  Laterality: N/A;  ? INTRAVASCULAR PRESSURE WIRE/FFR STUDY N/A 07/17/2020  ? Procedure: INTRAVASCULAR PRESSURE WIRE/FFR STUDY;  Surgeon: Marykay Lex, MD;  Location: [facility] INVASIVE CV LAB;  Service: Cardiovascular;  Laterality: N/A;  ? LEFT HEART CATH AND CORONARY ANGIOGRAPHY N/A 03/30/2020  ? Procedure: LEFT HEART CATH AND CORONARY ANGIOGRAPHY;  Surgeon: Kathleene Hazel, MD;  Location: [facility] INVASIVE CV LAB;  Service: Cardiovascular;  Laterality: N/A;  ? LEFT HEART CATH AND CORONARY ANGIOGRAPHY N/A 07/17/2020  ? Procedure: LEFT HEART CATH AND CORONARY ANGIOGRAPHY;  Surgeon: Marykay Lex, MD;  Location: [facility] INVASIVE CV LAB;  Service: Cardiovascular;  Laterality: N/A;  ? LEFT HEART CATHETERIZATION WITH CORONARY ANGIOGRAM N/A 09/24/2012  ? Procedure: LEFT HEART CATHETERIZATION WITH CORONARY ANGIOGRAM;  Surgeon:  Lesleigh Noe, MD;  Location: [facility] CATH LAB;  Service: Cardiovascular;  Laterality: N/A;  ? ? ?Current Medications: ?Current Meds  ?Medication Sig  ? amLODipine (NORVASC) 10 MG tablet Take 1 tablet (10 mg total) by mouth daily.  ? aspirin 81 MG EC tablet Take 1 tablet (81 mg total) by mouth daily. Swallow whole.  ? atorvastatin (LIPITOR) 80 MG tablet Take 80 mg by mouth daily.  ? carvedilol (COREG) 12.5 MG tablet Take 1 tablet (12.5 mg total) by mouth 2 (two) times daily.  ? cloNIDine (CATAPRES) 0.1 MG tablet Take 0.1 mg by mouth 2 (two) times daily.  ? ezetimibe (ZETIA) 10 MG tablet Take 1 tablet (10 mg total) by mouth daily.  ? nitroGLYCERIN (NITROSTAT) 0.4 MG SL tablet Place 1 tablet (0.4 mg total) under the tongue every 5 (five) minutes as needed for chest pain. Up to 3 times.  ? [DISCONTINUED] pantoprazole (PROTONIX) 40 MG tablet Take 1 tablet (40 mg total) by mouth daily.  ? [DISCONTINUED] ranolazine (RANEXA) 500 MG 12 hr tablet  Take 1 tablet (500 mg total) by mouth 2 (two) times daily.  ?  ? ?Allergies:   Plavix [clopidogrel], Iodinated contrast media, Other, and Peanuts [peanut oil]  ? ?Social History  ? ?Socioeconomic History  ? Marital status: Married  ?  Spouse name: Not on file  ? Number of children: Not on file  ? Years of education: Not on file  ? Highest education level: Not on file  ?Occupational History  ? Not on file  ?Tobacco Use  ? Smoking status: Every Day  ?  Types: Cigarettes  ? Smokeless tobacco: Never  ?Vaping Use  ? Vaping Use: Never used  ?Substance and Sexual Activity  ? Alcohol use: Yes  ?  Alcohol/week: 3.0 - 4.0 standard drinks  ?  Types: 2 Cans of beer, 1 - 2 Shots of liquor per week  ? Drug use: Yes  ?  Types: Cocaine  ? Sexual activity: Not on file  ?Other Topics Concern  ? Not on file  ?Social History Narrative  ? Not on file  ? ?Social Determinants of Health  ? ?Financial Resource Strain: Not on file  ?Food Insecurity: Not on file  ?Transportation Needs: Not on file  ?Physical  Activity: Not on file  ?Stress: Not on file  ?Social Connections: Not on file  ?  ? ?Family History: ?The patient's family history includes CAD in his brother; Diabetes in his father; Hypertension in his mother

## 2022-02-10 ENCOUNTER — Telehealth: Payer: Self-pay | Admitting: Cardiology

## 2022-02-10 ENCOUNTER — Other Ambulatory Visit: Payer: Self-pay

## 2022-02-10 MED ORDER — CARVEDILOL 12.5 MG PO TABS: 12.5000 mg | ORAL_TABLET | Freq: Two times a day (BID) | ORAL | 3 refills | Status: DC

## 2022-02-10 MED ORDER — AMLODIPINE BESYLATE 10 MG PO TABS: 10.0000 mg | ORAL_TABLET | Freq: Every day | ORAL | 3 refills | Status: DC

## 2022-02-10 MED ORDER — ASPIRIN 81 MG PO TBEC: 81.0000 mg | DELAYED_RELEASE_TABLET | Freq: Every day | ORAL | 3 refills | Status: DC

## 2022-02-10 MED ORDER — EZETIMIBE 10 MG PO TABS: 10.0000 mg | ORAL_TABLET | Freq: Every day | ORAL | 0 refills | Status: DC

## 2022-02-10 NOTE — Telephone Encounter (Signed)
?*  STAT* If patient is at the pharmacy, call can be transferred to refill team. ? ? ?1. Which medications need to be refilled? (please list name of each medication and dose if known) All heart medications  ? ?2. Which pharmacy/location (including street and city if local pharmacy) is medication to be sent to? [id number] [address], [place] ? ?3. Do they need a 30 day or 90 day supply? 90 day supply ? ?Patient is needing all heart medications. States pharmacy advised him today they have not received anything from our office.   ?

## 2022-02-10 NOTE — Telephone Encounter (Signed)
Refills for Amlodipine 10mg  q d #90 3 ref, Aspirin 81mg  q d #90 3 ref, carvedilol 12.5mg  BID #180 3 Ref, Zetia 10mg  q d #90 3 ref. [facility] to [facility] [place].  ?

## 2022-03-04 ENCOUNTER — Telehealth (HOSPITAL_COMMUNITY): Payer: Self-pay | Admitting: *Deleted

## 2022-03-04 NOTE — Telephone Encounter (Signed)
Patient given detailed instructions per Myocardial Perfusion Study Information Sheet for the test on 03/11/22 Patient notified to arrive 15 minutes early and that it is imperative to arrive on time for appointment to keep from having the test rescheduled.  If you need to cancel or reschedule your appointment, please call the office within 24 hours of your appointment. . Patient verbalized understanding.   Gayla Benn Jacqueline .  

## 2022-03-11 ENCOUNTER — Ambulatory Visit (INDEPENDENT_AMBULATORY_CARE_PROVIDER_SITE_OTHER): Payer: Medicare Other

## 2022-03-11 DIAGNOSIS — I2510 Atherosclerotic heart disease of native coronary artery without angina pectoris: Secondary | ICD-10-CM | POA: Diagnosis not present

## 2022-03-11 LAB — MYOCARDIAL PERFUSION IMAGING
LV dias vol: 125 mL (ref 62–150)
LV sys vol: 65 mL
Nuc Stress EF: 48 %
Peak HR: 78 {beats}/min
Rest HR: 55 {beats}/min
Rest Nuclear Isotope Dose: 10.6 mCi
SDS: 1
SRS: 0
SSS: 1
Stress Nuclear Isotope Dose: 32.1 mCi
TID: 0.98

## 2022-03-11 MED ORDER — REGADENOSON 0.4 MG/5ML IV SOLN
0.4000 mg | Freq: Once | INTRAVENOUS | Status: AC
  Administered 2022-03-11: 0.4 mg via INTRAVENOUS

## 2022-03-11 MED ORDER — TECHNETIUM TC 99M TETROFOSMIN IV KIT
10.6000 | PACK | Freq: Once | INTRAVENOUS | Status: AC | PRN
  Administered 2022-03-11: 10.6 via INTRAVENOUS

## 2022-03-11 MED ORDER — TECHNETIUM TC 99M TETROFOSMIN IV KIT
32.1000 | PACK | Freq: Once | INTRAVENOUS | Status: AC | PRN
Start: 2022-03-11 — End: 2022-03-11
  Administered 2022-03-11: 32.1 via INTRAVENOUS

## 2022-03-20 ENCOUNTER — Telehealth: Payer: Self-pay | Admitting: Cardiology

## 2022-03-20 NOTE — Telephone Encounter (Signed)
Spoke with pt who was concerned with his heart and the mild decrease in his ejection fraction discussed with him from his recent test. He reported no chest pain or shortness of breath. He has an appt to discuss the test and medication with Dr. Tomie China on 03-24-22. He stated that he prefers to go to the ER for evaluation. Discussed with pt that unless he has an emergent or urgent need that his appt on 03-24-22 would be sufficient. He stated that he preferred to be checked at the ER and would probably go tomorrow.

## 2022-03-20 NOTE — Telephone Encounter (Signed)
New Message:     Please have Ladonna Snide to call patient. He wants to be admitted to the hospital, He does not want to wait any longer, afraid something might happen.

## 2022-03-24 ENCOUNTER — Ambulatory Visit (INDEPENDENT_AMBULATORY_CARE_PROVIDER_SITE_OTHER): Payer: Medicare Other | Admitting: Cardiology

## 2022-03-24 ENCOUNTER — Encounter: Payer: Self-pay | Admitting: Cardiology

## 2022-03-24 VITALS — BP 120/76 | HR 58 | Ht 66.0 in | Wt 178.0 lb

## 2022-03-24 DIAGNOSIS — F17200 Nicotine dependence, unspecified, uncomplicated: Secondary | ICD-10-CM | POA: Diagnosis not present

## 2022-03-24 DIAGNOSIS — I2510 Atherosclerotic heart disease of native coronary artery without angina pectoris: Secondary | ICD-10-CM | POA: Diagnosis not present

## 2022-03-24 DIAGNOSIS — I10 Essential (primary) hypertension: Secondary | ICD-10-CM | POA: Diagnosis not present

## 2022-03-24 DIAGNOSIS — E785 Hyperlipidemia, unspecified: Secondary | ICD-10-CM

## 2022-03-24 NOTE — Patient Instructions (Signed)
Medication Instructions:  Your physician recommends that you continue on your current medications as directed. Please refer to the Current Medication list given to you today.  *If you need a refill on your cardiac medications before your next appointment, please call your pharmacy*   Lab Work: Your physician recommends that you have labs done in the office today. Your test included  basic metabolic panel, liver function and lipids.  If you have labs (blood work) drawn today and your tests are completely normal, you will receive your results only by: MyChart Message (if you have MyChart) OR A paper copy in the mail If you have any lab test that is abnormal or we need to change your treatment, we will call you to review the results.   Testing/Procedures: Your physician has requested that you have an echocardiogram. Echocardiography is a painless test that uses sound waves to create images of your heart. It provides your doctor with information about the size and shape of your heart and how well your heart's chambers and valves are working. This procedure takes approximately one hour. There are no restrictions for this procedure.    Follow-Up: At [facility], you and your health needs are our priority.  As part of our continuing mission to provide you with exceptional heart care, we have created designated Provider Care Teams.  These Care Teams include your primary Cardiologist (physician) and Advanced Practice Providers (APPs -  Physician Assistants and Nurse Practitioners) who all work together to provide you with the care you need, when you need it.  We recommend signing up for the patient portal called "MyChart".  Sign up information is provided on this After Visit Summary.  MyChart is used to connect with patients for Virtual Visits (Telemedicine).  Patients are able to view lab/test results, encounter notes, upcoming appointments, etc.  Non-urgent messages can be sent to your provider as  well.   To learn more about what you can do with MyChart, go to [email or web address].    Your next appointment:   6 month(s)  The format for your next appointment:   In Person  Provider:   Belva Crome, MD   Other Instructions Echocardiogram An echocardiogram is a test that uses sound waves (ultrasound) to produce images of the heart. Images from an echocardiogram can provide important information about: Heart size and shape. The size and thickness and movement of your heart's walls. Heart muscle function and strength. Heart valve function or if you have stenosis. Stenosis is when the heart valves are too narrow. If blood is flowing backward through the heart valves (regurgitation). A tumor or infectious growth around the heart valves. Areas of heart muscle that are not working well because of poor blood flow or injury from a heart attack. Aneurysm detection. An aneurysm is a weak or damaged part of an artery wall. The wall bulges out from the normal force of blood pumping through the body. Tell a health care provider about: Any allergies you have. All medicines you are taking, including vitamins, herbs, eye drops, creams, and over-the-counter medicines. Any blood disorders you have. Any surgeries you have had. Any medical conditions you have. Whether you are pregnant or may be pregnant. What are the risks? Generally, this is a safe test. However, problems may occur, including an allergic reaction to dye (contrast) that may be used during the test. What happens before the test? No specific preparation is needed. You may eat and drink normally. What happens during the test? You will  take off your clothes from the waist up and put on a hospital gown. Electrodes or electrocardiogram (ECG)patches may be placed on your chest. The electrodes or patches are then connected to a device that monitors your heart rate and rhythm. You will lie down on a table for an ultrasound exam. A  gel will be applied to your chest to help sound waves pass through your skin. A handheld device, called a transducer, will be pressed against your chest and moved over your heart. The transducer produces sound waves that travel to your heart and bounce back (or "echo" back) to the transducer. These sound waves will be captured in real-time and changed into images of your heart that can be viewed on a video monitor. The images will be recorded on a computer and reviewed by your health care provider. You may be asked to change positions or hold your breath for a short time. This makes it easier to get different views or better views of your heart. In some cases, you may receive contrast through an IV in one of your veins. This can improve the quality of the pictures from your heart. The procedure may vary among health care providers and hospitals.   What can I expect after the test? You may return to your normal, everyday life, including diet, activities, and medicines, unless your health care provider tells you not to do that. Follow these instructions at home: It is up to you to get the results of your test. Ask your health care provider, or the department that is doing the test, when your results will be ready. Keep all follow-up visits. This is important. Summary An echocardiogram is a test that uses sound waves (ultrasound) to produce images of the heart. Images from an echocardiogram can provide important information about the size and shape of your heart, heart muscle function, heart valve function, and other possible heart problems. You do not need to do anything to prepare before this test. You may eat and drink normally. After the echocardiogram is completed, you may return to your normal, everyday life, unless your health care provider tells you not to do that. This information is not intended to replace advice given to you by your health care provider. Make sure you discuss any questions you  have with your health care provider. Document Revised: 05/15/2020 Document Reviewed: 05/15/2020 Elsevier Patient Education  2021 [employer].

## 2022-03-24 NOTE — Progress Notes (Signed)
Cardiology Office Note:    Date:  03/24/2022   ID:  Cody [name], DOB 15-Jan-1961, MRN [id number]  PCP:  Ailene Ravel, MD  Cardiologist:  Garwin Brothers, MD   Referring MD: Ailene Ravel, MD    ASSESSMENT:    1. Coronary artery disease involving native coronary artery of native heart without angina pectoris   2. Primary hypertension   3. Dyslipidemia   4. Smoker    PLAN:    In order of problems listed above:  Coronary artery disease: Secondary prevention stressed with patient.  Importance of compliance with diet medication stressed any vocalized understanding.  He was advised to walk at least half a mile a day 5 days a week Cigarette smoking: Advised to quit and he promises to do better.  He is cutting down. Mixed dyslipidemia: Diet emphasized.  He is fasting and will have blood work today. Essential hypertension: Stable at this time and managed by primary care. Mildly reduced ejection fraction: I would like to get an echocardiogram to confirm this before I begin any medications.  He was not happy with this decision but I told him that that would be the right thing to do and he understands. Patient will be seen in follow-up appointment in 6 months or earlier if the patient has any concerns    Medication Adjustments/Labs and Tests Ordered: Current medicines are reviewed at length with the patient today.  Concerns regarding medicines are outlined above.  Orders Placed This Encounter  Procedures   Hepatic function panel   Basic metabolic panel   Lipid panel   ECHOCARDIOGRAM COMPLETE   No orders of the defined types were placed in this encounter.    Chief Complaint  Patient presents with   Follow-up     History of Present Illness:    Cody [name] is a 61 y.o. male.  Patient has past medical history of coronary artery disease, essential hypertension, dyslipidemia and smoking.  He underwent stress testing which revealed mild reduction in ejection fraction.   Patient is here to discuss this.  For some reason he thought he would have an echocardiogram today.  He tells me that he does not have a ride and therefore he wants to get an echocardiogram today.  I told him that I will see if there is an availability if not I will have to reschedule this.  He was unhappy about this.  He also thought I would begin him on new medications.  At the time of my evaluation, the patient is alert awake oriented and in no distress.   Past Medical History:  Diagnosis Date   Ambulatory dysfunction 07/17/2020   CAD (coronary artery disease) 05/30/2020   CAP (community acquired pneumonia) 03/31/2020   Chest pain 09/24/2012   Nuclear study normal the patient demanded coronary angiography because of prior false-negative studies   Chronic chest pain with high risk for CAD    Cocaine abuse (HCC) 09/20/2012   Constipation 09/22/2012   Coronary artery disease    Dyslipidemia 09/20/2012   GERD (gastroesophageal reflux disease)    has stomach ulcers a while back   Groin pain, chronic, right    Hiatal hernia    HTN (hypertension)    Hypokalemia    Leukocytosis    PAD (peripheral artery disease) (HCC) 09/20/2012   Positive cardiac stress test 03/29/2020   PUD (peptic ulcer disease)    Retinal vein occlusion 09/24/2012   Sciatica of right side 05/30/2020   Smoker 09/20/2012  Visual field defect 09/21/2012    Past Surgical History:  Procedure Laterality Date   BIOPSY  07/19/2020   Procedure: BIOPSY;  Surgeon: Lynann Bologna, MD;  Location: [facility] ENDOSCOPY;  Service: Gastroenterology;;   CORONARY STENT PLACEMENT     ESOPHAGOGASTRODUODENOSCOPY (EGD) WITH PROPOFOL N/A 07/19/2020   Procedure: ESOPHAGOGASTRODUODENOSCOPY (EGD) WITH PROPOFOL;  Surgeon: Lynann Bologna, MD;  Location: [facility] ENDOSCOPY;  Service: Gastroenterology;  Laterality: N/A;   INTRAVASCULAR PRESSURE WIRE/FFR STUDY N/A 03/30/2020   Procedure: INTRAVASCULAR PRESSURE WIRE/FFR STUDY;  Surgeon: Kathleene Hazel,  MD;  Location: [facility] INVASIVE CV LAB;  Service: Cardiovascular;  Laterality: N/A;   INTRAVASCULAR PRESSURE WIRE/FFR STUDY N/A 07/17/2020   Procedure: INTRAVASCULAR PRESSURE WIRE/FFR STUDY;  Surgeon: Marykay Lex, MD;  Location: [facility] INVASIVE CV LAB;  Service: Cardiovascular;  Laterality: N/A;   LEFT HEART CATH AND CORONARY ANGIOGRAPHY N/A 03/30/2020   Procedure: LEFT HEART CATH AND CORONARY ANGIOGRAPHY;  Surgeon: Kathleene Hazel, MD;  Location: [facility] INVASIVE CV LAB;  Service: Cardiovascular;  Laterality: N/A;   LEFT HEART CATH AND CORONARY ANGIOGRAPHY N/A 07/17/2020   Procedure: LEFT HEART CATH AND CORONARY ANGIOGRAPHY;  Surgeon: Marykay Lex, MD;  Location: [facility] INVASIVE CV LAB;  Service: Cardiovascular;  Laterality: N/A;   LEFT HEART CATHETERIZATION WITH CORONARY ANGIOGRAM N/A 09/24/2012   Procedure: LEFT HEART CATHETERIZATION WITH CORONARY ANGIOGRAM;  Surgeon: Lesleigh Noe, MD;  Location: [facility] CATH LAB;  Service: Cardiovascular;  Laterality: N/A;    Current Medications: Current Meds  Medication Sig   amLODipine (NORVASC) 10 MG tablet Take 1 tablet (10 mg total) by mouth daily.   aspirin 81 MG EC tablet Take 1 tablet (81 mg total) by mouth daily. Swallow whole.   atorvastatin (LIPITOR) 80 MG tablet Take 80 mg by mouth daily.   carvedilol (COREG) 12.5 MG tablet Take 1 tablet (12.5 mg total) by mouth 2 (two) times daily.   cloNIDine (CATAPRES) 0.1 MG tablet Take 0.1 mg by mouth 2 (two) times daily.   ezetimibe (ZETIA) 10 MG tablet Take 1 tablet (10 mg total) by mouth daily.   nitroGLYCERIN (NITROSTAT) 0.4 MG SL tablet Place 1 tablet (0.4 mg total) under the tongue every 5 (five) minutes as needed for chest pain. Up to 3 times.   pantoprazole (PROTONIX) 40 MG tablet Take 1 tablet (40 mg total) by mouth daily.   ranolazine (RANEXA) 500 MG 12 hr tablet Take 1 tablet (500 mg total) by mouth 2 (two) times daily.     Allergies:   Plavix [clopidogrel], Iodinated contrast media, Other, and  Peanuts [peanut oil]   Social History   Socioeconomic History   Marital status: Married    Spouse name: Not on file   Number of children: Not on file   Years of education: Not on file   Highest education level: Not on file  Occupational History   Not on file  Tobacco Use   Smoking status: Every Day    Types: Cigarettes   Smokeless tobacco: Never  Vaping Use   Vaping Use: Never used  Substance and Sexual Activity   Alcohol use: Yes    Alcohol/week: 3.0 - 4.0 standard drinks of alcohol    Types: 2 Cans of beer, 1 - 2 Shots of liquor per week   Drug use: Yes    Types: Cocaine   Sexual activity: Not on file  Other Topics Concern   Not on file  Social History Narrative   Not on file   Social Determinants  of Health   Financial Resource Strain: Not on file  Food Insecurity: Not on file  Transportation Needs: Not on file  Physical Activity: Not on file  Stress: Not on file  Social Connections: Not on file     Family History: The patient's family history includes CAD in his brother; Diabetes in his father; Hypertension in his mother.  ROS:   Please see the history of present illness.    All other systems reviewed and are negative.  EKGs/Labs/Other Studies Reviewed:    The following studies were reviewed today: Stress test reveals ejection fraction of 48%.  Event monitor was unremarkable.   Recent Labs: No results found for requested labs within last 365 days.  Recent Lipid Panel    Component Value Date/Time   CHOL 117 07/16/2020 1200   TRIG 79 07/16/2020 1200   HDL 28 (L) 07/16/2020 1200   CHOLHDL 4.2 07/16/2020 1200   VLDL 16 07/16/2020 1200   LDLCALC 73 07/16/2020 1200    Physical Exam:    VS:  BP 120/76 (BP Location: Left Arm, Patient Position: Sitting, Cuff Size: Normal)   Pulse (!) 58   Ht 5\' 6"  (1.676 m)   Wt 178 lb (80.7 kg)   SpO2 96%   BMI 28.73 kg/m     Wt Readings from Last 3 Encounters:  03/24/22 178 lb (80.7 kg)  03/11/22 180 lb (81.6  kg)  02/06/22 180 lb 9.6 oz (81.9 kg)     GEN: Patient is in no acute distress HEENT: Normal NECK: No JVD; No carotid bruits LYMPHATICS: No lymphadenopathy CARDIAC: Hear sounds regular, 2/6 systolic murmur at the apex. RESPIRATORY:  Clear to auscultation without rales, wheezing or rhonchi  ABDOMEN: Soft, non-tender, non-distended MUSCULOSKELETAL:  No edema; No deformity  SKIN: Warm and dry NEUROLOGIC:  Alert and oriented x 3 PSYCHIATRIC:  Normal affect   Signed, 04/08/22, MD  03/24/2022 8:51 AM    [facility]

## 2022-03-25 LAB — HEPATIC FUNCTION PANEL
ALT: 36 IU/L (ref 0–44)
AST: 24 IU/L (ref 0–40)
Albumin: 4.6 g/dL (ref 3.8–4.9)
Alkaline Phosphatase: 102 IU/L (ref 44–121)
Bilirubin Total: 0.4 mg/dL (ref 0.0–1.2)
Bilirubin, Direct: 0.16 mg/dL (ref 0.00–0.40)
Total Protein: 7.1 g/dL (ref 6.0–8.5)

## 2022-03-25 LAB — BASIC METABOLIC PANEL
BUN/Creatinine Ratio: 8 — ABNORMAL LOW (ref 9–20)
BUN: 9 mg/dL (ref 6–24)
CO2: 27 mmol/L (ref 20–29)
Calcium: 9.5 mg/dL (ref 8.7–10.2)
Chloride: 102 mmol/L (ref 96–106)
Creatinine, Ser: 1.06 mg/dL (ref 0.76–1.27)
Glucose: 96 mg/dL (ref 70–99)
Potassium: 3.9 mmol/L (ref 3.5–5.2)
Sodium: 142 mmol/L (ref 134–144)
eGFR: 81 mL/min/{1.73_m2} (ref >59)

## 2022-03-25 LAB — LIPID PANEL
Chol/HDL Ratio: 2.8 ratio (ref 0.0–5.0)
Cholesterol, Total: 122 mg/dL (ref 100–199)
HDL: 43 mg/dL (ref >39)
LDL Chol Calc (NIH): 63 mg/dL (ref 0–99)
Triglycerides: 82 mg/dL (ref 0–149)
VLDL Cholesterol Cal: 16 mg/dL (ref 5–40)

## 2022-03-26 ENCOUNTER — Ambulatory Visit (INDEPENDENT_AMBULATORY_CARE_PROVIDER_SITE_OTHER): Payer: Medicare Other

## 2022-03-26 DIAGNOSIS — I2510 Atherosclerotic heart disease of native coronary artery without angina pectoris: Secondary | ICD-10-CM

## 2022-03-26 LAB — ECHOCARDIOGRAM COMPLETE
Area-P 1/2: 2.77 cm2
S' Lateral: 3.7 cm

## 2022-05-08 ENCOUNTER — Other Ambulatory Visit: Payer: Self-pay | Admitting: Cardiology

## 2022-06-11 ENCOUNTER — Other Ambulatory Visit: Payer: Self-pay

## 2022-06-11 MED ORDER — AMLODIPINE BESYLATE 10 MG PO TABS: 10.0000 mg | ORAL_TABLET | Freq: Every day | ORAL | 2 refills | Status: DC

## 2022-06-11 MED ORDER — EZETIMIBE 10 MG PO TABS: 10.0000 mg | ORAL_TABLET | Freq: Every day | ORAL | 2 refills | Status: DC

## 2022-07-02 IMAGING — CT CT HEAD W/O CM
3 series · 14 of 47 positions shown, 16 images · non-contrast
Comparison: CT head 03/10/2018

CLINICAL DATA: Dizziness

EXAM:
CT HEAD WITHOUT CONTRAST
TECHNIQUE: Contiguous axial images were obtained from the base of the skull
through the vertex without intravenous contrast.

[Series 3: head 5.0 h30s · axial · 0.50mm/px · z∈[-76,+59]mm · 8 of 33 slices shown, 10 images]
[im 3/33  brain]
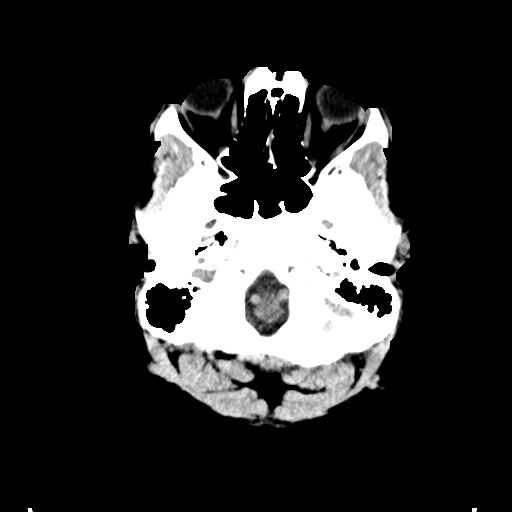
[im 3/33  bone]
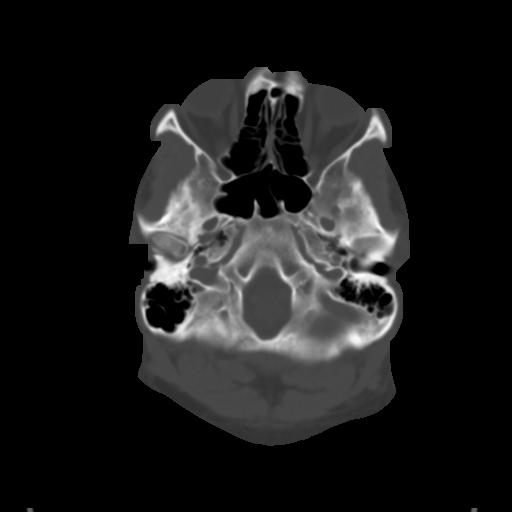
[im 7/33  brain]
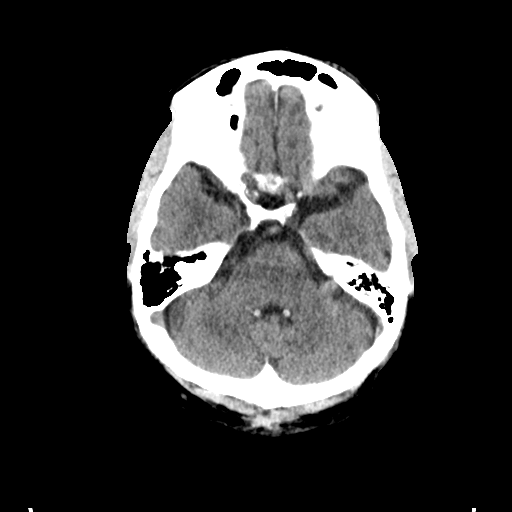
[im 10/33  brain]
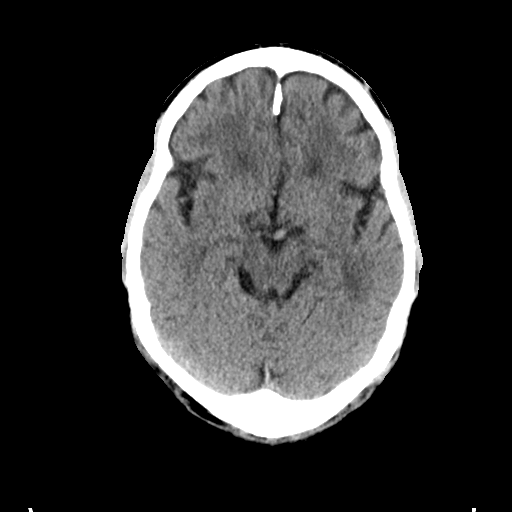
[im 15/33  brain]
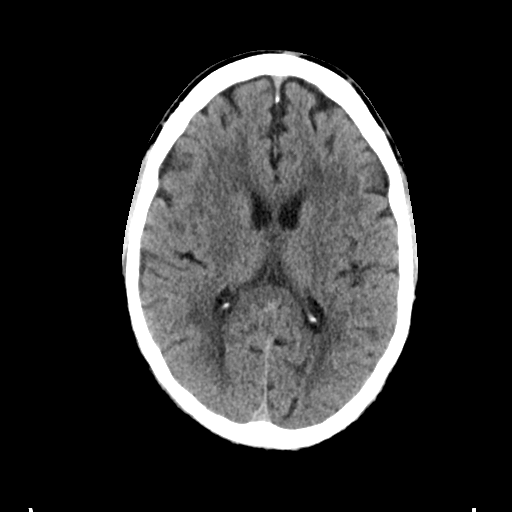
[im 18/33  brain]
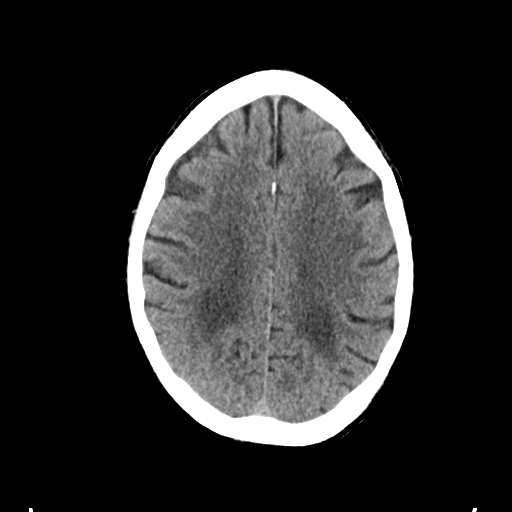
[im 18/33  bone]
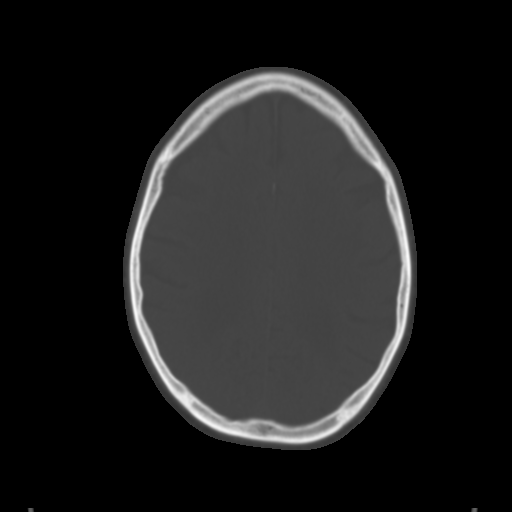
[im 23/33  brain]
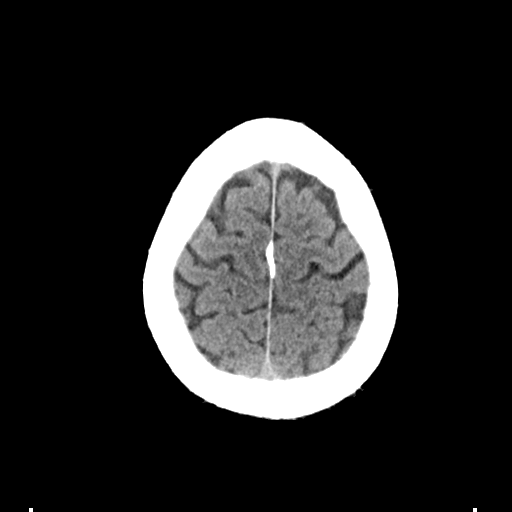
[im 26/33  brain]
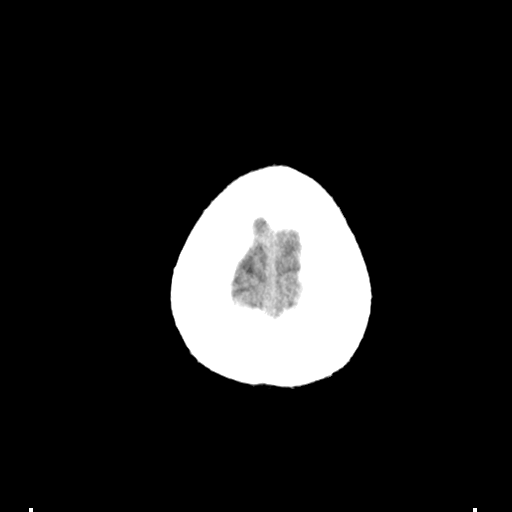
[im 30/33  brain]
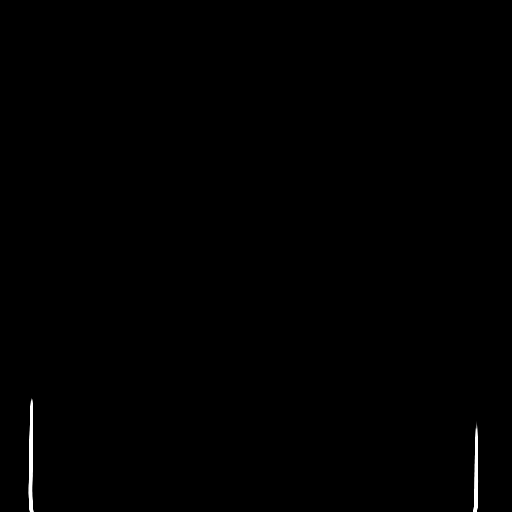

[Series 5: head 3.0 mpr cor · coronal · 0.32mm/px · 3 of 74 slices shown]
[im 25/74  brain]
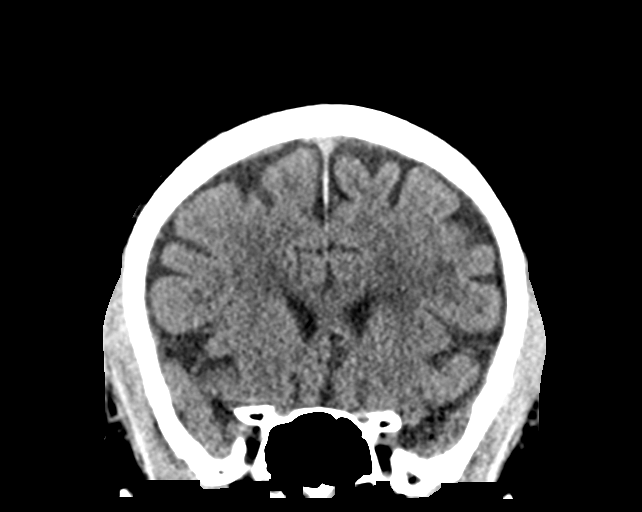
[im 33/74  brain]
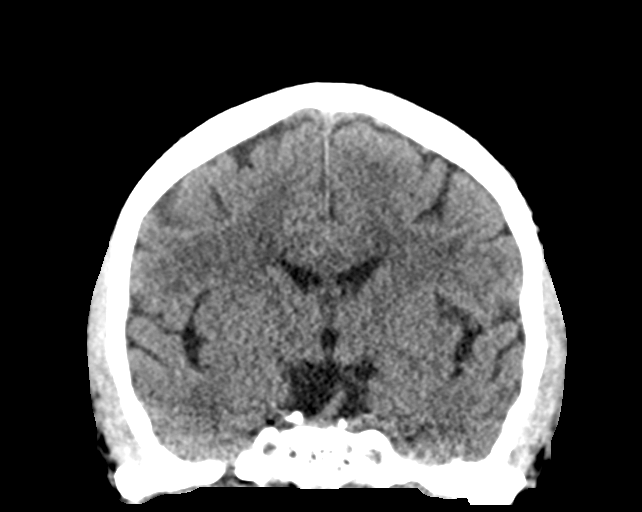
[im 41/74  brain]
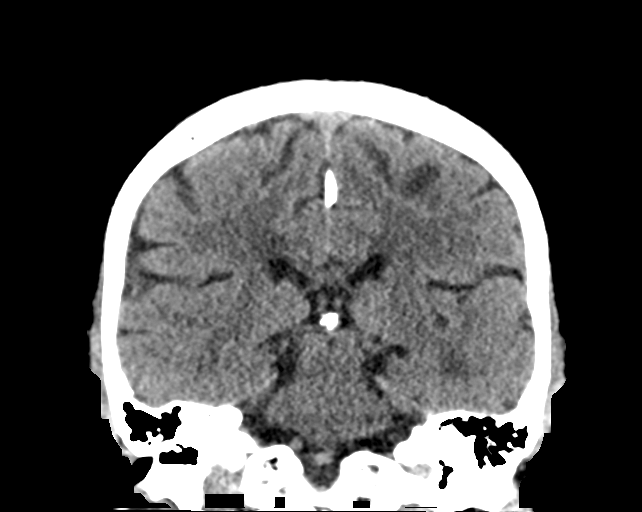

[Series 6: head 3.0 mpr sag · sagittal · 0.31mm/px · 3 of 67 slices shown]
[im 23/67  brain]
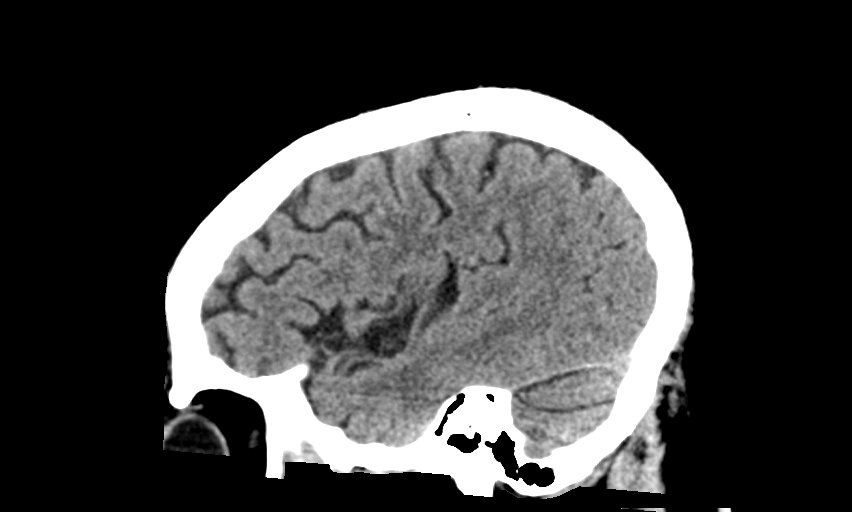
[im 34/67  brain]
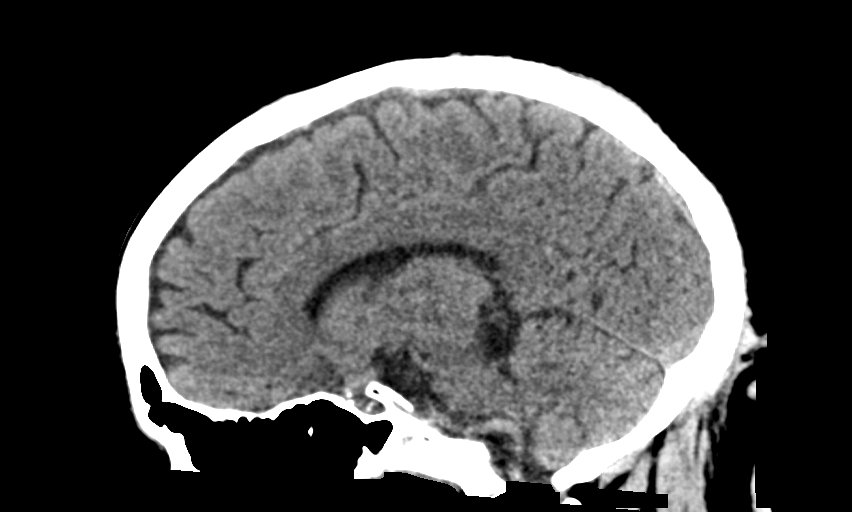
[im 45/67  brain]
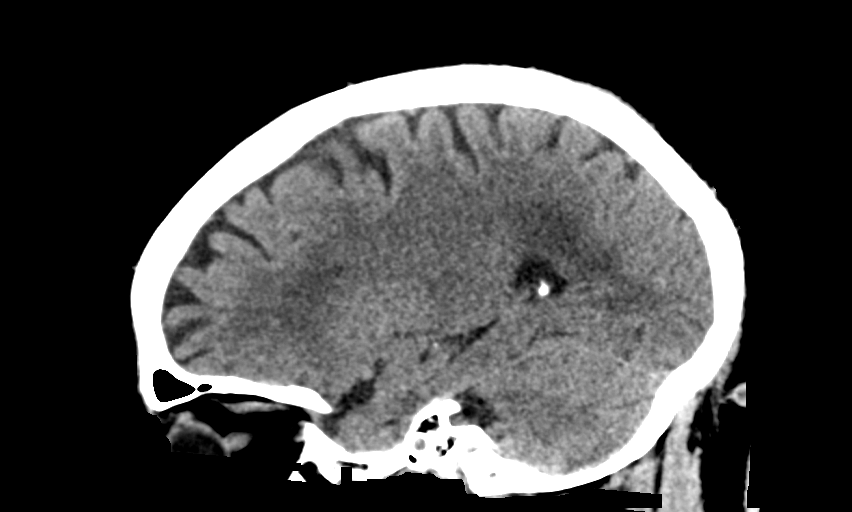

[14 of 47 positions shown; findings below may reference images not displayed]

FINDINGS: Brain: Mild atrophy with progression. Diffuse bilateral white matter
hypodensity also with progression.

Negative for acute infarct, hemorrhage, mass

Vascular: Negative for hyperdense vessel

Skull: Negative

Sinuses/Orbits: Paranasal sinuses clear.  Negative orbit.

Other: None
IMPRESSION: Progressive chronic microvascular ischemic change which is now
extensive. Mild atrophy also with progression

No acute abnormality.

## 2022-07-21 ENCOUNTER — Telehealth: Payer: Self-pay | Admitting: Cardiology

## 2022-07-21 NOTE — Telephone Encounter (Signed)
Pt c/o Shortness Of Breath: STAT if SOB developed within the last 24 hours or pt is noticeably SOB on the phone   1. Are you currently SOB (can you hear that pt is SOB on the phone)? No    2. How long have you been experiencing SOB? Thursday    3. Are you SOB when sitting or when up moving around? Moving around   4. Are you currently experiencing any other symptoms? Headaches, sweating, face swollen, could not hardly walk, sleeping a lot, drained; chest tightness  Patient was a STAT call. Was told by Cathie Beams that she will get Truddie Hidden to look at notes and give patient a call back

## 2022-07-21 NOTE — Telephone Encounter (Signed)
Pt c/o Shortness Of Breath: STAT if SOB developed within the last 24 hours or pt is noticeably SOB on the phone  1. Are you currently SOB (can you hear that pt is SOB on the phone)? No   2. How long have you been experiencing SOB? Thursday   3. Are you SOB when sitting or when up moving around? Moving around  4. Are you currently experiencing any other symptoms? Headaches, sweating, face swollen, could not hardly walk, sleeping a lot, drained; chest tightness

## 2022-07-21 NOTE — Telephone Encounter (Signed)
Called pt at 11:15 and he denied chest pain since Thursday. Pt states that he has been having chills and sweating, sleeping a lot. Pt has not been checking his BP but states that he does not feel it is elevated. Advised he needed to be checked for COVID and pt states that he thought that might be what was going on but does not want to get anyone else sick. Pt verbalized understanding to go to the ED for return of chest pain and use your NTG as needed. Pt had no additional questions.

## 2022-08-14 ENCOUNTER — Telehealth: Payer: Self-pay | Admitting: Cardiology

## 2022-08-14 ENCOUNTER — Other Ambulatory Visit: Payer: Self-pay

## 2022-08-14 ENCOUNTER — Encounter: Payer: Self-pay | Admitting: Cardiology

## 2022-08-14 NOTE — Telephone Encounter (Signed)
Called patient and he reported that his blood pressure today was 175/107 and his left arm was numb and under his left breast it felt "puffy and swollen". Patient normally takes clonidine but has reduced it from twice daily to once daily because when he takes it he has been seeing spots and having blurriness and seeing flashes of light in his left eye. Patient also reports having a headache. I reviewed these symptoms with Dr. Bing Matter and he recommended that the patient be evaluated in the ED for stoke like symptoms. I informed the patient of Dr. Bing Matter recommendation and he stated that if his symptoms got worse that he would go to the ED. I encouraged him that if he felt worse to go to the nearest ED. Patient stated he understood and had no further questions at this time.

## 2022-08-14 NOTE — Telephone Encounter (Signed)
Pt c/o medication issue:  1. Name of Medication:  cloNIDine (CATAPRES) 0.1 MG tablet  2. How are you currently taking this medication (dosage and times per day)?  Patient states he stopped taking it about 2 weeks ago   3. Are you having a reaction (difficulty breathing--STAT)?   4. What is your medication issue?   Patient states this medication is causing major issues with his left eye: spots, blurriness, pain. He states in the past Plavix has damaged his eye. He states it helps with his BP.

## 2022-08-14 NOTE — Telephone Encounter (Signed)
Error

## 2022-09-02 ENCOUNTER — Telehealth: Payer: Self-pay | Admitting: *Deleted

## 2022-09-02 MED ORDER — ATORVASTATIN CALCIUM 80 MG PO TABS: 80.0000 mg | ORAL_TABLET | Freq: Every day | ORAL | 0 refills | Status: DC

## 2022-09-02 NOTE — Telephone Encounter (Signed)
Rx refill sent to pharmacy. 

## 2022-10-09 ENCOUNTER — Telehealth: Payer: Self-pay | Admitting: Cardiology

## 2022-10-09 NOTE — Telephone Encounter (Signed)
Rx refill sent to pharmacy. 

## 2022-10-09 NOTE — Telephone Encounter (Signed)
*  STAT* If patient is at the pharmacy, call can be transferred to refill team.   1. Which medications need to be refilled? (please list name of each medication and dose if known) new prescriptions for  Amlodipine, Aspirin, , Atorvastatin, Ranolazine, Clonidine, Carvedilol, Pantoprzole, Ezetimibe , and Nitroglycerin   2. Which pharmacy/location (including street and city if local pharmacy) is medication to be sent to? [facility]  [place]  3. Do they need a 30 day or 90 day supply? 90 days and refills- please call his in today - out of of some of these

## 2022-11-08 ENCOUNTER — Encounter (HOSPITAL_COMMUNITY): Payer: Self-pay

## 2022-11-08 ENCOUNTER — Other Ambulatory Visit: Payer: Self-pay

## 2022-11-08 ENCOUNTER — Encounter (HOSPITAL_COMMUNITY): Payer: Medicare Other

## 2022-11-08 ENCOUNTER — Emergency Department (HOSPITAL_COMMUNITY)
Admission: EM | Admit: 2022-11-08 | Discharge: 2022-11-08 | Disposition: A | Discharge status: Home / Self Care | Payer: Medicare Other | Attending: Emergency Medicine | Admitting: Emergency Medicine

## 2022-11-08 ENCOUNTER — Emergency Department (HOSPITAL_COMMUNITY): Payer: Medicare Other

## 2022-11-08 ENCOUNTER — Emergency Department (HOSPITAL_BASED_OUTPATIENT_CLINIC_OR_DEPARTMENT_OTHER): Payer: Medicare Other

## 2022-11-08 DIAGNOSIS — M169 Osteoarthritis of hip, unspecified: Secondary | ICD-10-CM | POA: Diagnosis not present

## 2022-11-08 DIAGNOSIS — Z7982 Long term (current) use of aspirin: Secondary | ICD-10-CM | POA: Diagnosis not present

## 2022-11-08 DIAGNOSIS — I10 Essential (primary) hypertension: Secondary | ICD-10-CM | POA: Diagnosis not present

## 2022-11-08 DIAGNOSIS — I2510 Atherosclerotic heart disease of native coronary artery without angina pectoris: Secondary | ICD-10-CM | POA: Insufficient documentation

## 2022-11-08 DIAGNOSIS — Z91010 Allergy to peanuts: Secondary | ICD-10-CM | POA: Diagnosis not present

## 2022-11-08 DIAGNOSIS — M79609 Pain in unspecified limb: Secondary | ICD-10-CM | POA: Diagnosis not present

## 2022-11-08 DIAGNOSIS — R1032 Left lower quadrant pain: Secondary | ICD-10-CM

## 2022-11-08 DIAGNOSIS — M25552 Pain in left hip: Secondary | ICD-10-CM | POA: Diagnosis present

## 2022-11-08 DIAGNOSIS — M1612 Unilateral primary osteoarthritis, left hip: Secondary | ICD-10-CM

## 2022-11-08 MED ORDER — ACETAMINOPHEN 500 MG PO TABS: 500.0000 mg | ORAL_TABLET | Freq: Four times a day (QID) | ORAL | 0 refills | Status: DC | PRN

## 2022-11-08 MED ORDER — METHYLPREDNISOLONE 4 MG PO TBPK: ORAL_TABLET | ORAL | 0 refills | Status: DC

## 2022-11-08 NOTE — ED Triage Notes (Signed)
Patient having left side groin pain for about 2 weeks, worsening today, history of catheterizations and worried about a blood clot because he took him self off of blood thinners.

## 2022-11-08 NOTE — Progress Notes (Signed)
Lower extremity venous left study completed.  Preliminary results relayed to [place], [place].   See CV Proc for preliminary results report.   Darlin Coco, RDMS, RVT

## 2022-11-08 NOTE — ED Notes (Signed)
Patient left in stable condition with staff for [place].

## 2022-11-08 NOTE — ED Provider Notes (Signed)
[facility] Provider Note   CSN: [id number] Arrival date & time: 11/08/22  0932     History  Chief Complaint  Patient presents with   Groin Pain    Cody [name] is a 62 y.o. male with history of CAD, retinal vein occlusion, hyperlipidemia, peripheral arterial disease, cocaine abuse, chronic chest pain, GERD, chronic right groin pain, hypertension who presents the emergency department complaining of left-sided groin pain for the past week.  Patient states he has had numerous heart catheterizations, cannot member if they have ever gone and through his left leg before.  It he states the pain starts in the left inguinal fold and causes a sharp/shock pain in his left thigh.  Denies any known injury, denies any overuse that he thinks could have strained the leg.  Denies any pain in the calf.  Intermittent numbness in his left foot.  Often gets his care up in [place].  Says that he used to be on Plavix, but took himself off of it because it "cause permanent damage in his left eye".  He has not taken Plavix in several weeks.   Groin Pain Pertinent negatives include no chest pain and no shortness of breath.       Home Medications Prior to Admission medications   Medication Sig Start Date End Date Taking? Authorizing Provider  acetaminophen (TYLENOL) 500 MG tablet Take 1 tablet (500 mg total) by mouth every 6 (six) hours as needed. 11/08/22  Yes Renny Gunnarson T, PA-C  methylPREDNISolone (MEDROL DOSEPAK) 4 MG TBPK tablet Take per package instructions 11/08/22  Yes Nakira Litzau T, PA-C  amLODipine (NORVASC) 10 MG tablet Take 1 tablet (10 mg total) by mouth daily. 06/11/22   Revankar, Reita Cliche, MD  aspirin 81 MG EC tablet Take 1 tablet (81 mg total) by mouth daily. Swallow whole. 02/10/22   Revankar, Reita Cliche, MD  atorvastatin (LIPITOR) 80 MG tablet Take 1 tablet (80 mg total) by mouth daily. 09/02/22   Revankar, Reita Cliche, MD  carvedilol (COREG) 12.5  MG tablet Take 1 tablet (12.5 mg total) by mouth 2 (two) times daily. 02/10/22   Revankar, Reita Cliche, MD  cloNIDine (CATAPRES) 0.1 MG tablet Take 1 tablet (0.1 mg total) by mouth 2 (two) times daily. 10/09/22   Revankar, Reita Cliche, MD  ezetimibe (ZETIA) 10 MG tablet Take 1 tablet (10 mg total) by mouth daily. 06/11/22   Revankar, Reita Cliche, MD  nitroGLYCERIN (NITROSTAT) 0.4 MG SL tablet Place 1 tablet (0.4 mg total) under the tongue every 5 (five) minutes as needed for chest pain. 10/09/22   Revankar, Reita Cliche, MD  pantoprazole (PROTONIX) 40 MG tablet Take 1 tablet (40 mg total) by mouth daily. 02/06/22   Revankar, Reita Cliche, MD  ranolazine (RANEXA) 500 MG 12 hr tablet Take 1 tablet (500 mg total) by mouth 2 (two) times daily. 02/06/22   Revankar, Reita Cliche, MD      Allergies    Plavix [clopidogrel], Iodinated contrast media, Other, and Peanuts [peanut oil]    Review of Systems   Review of Systems  Respiratory:  Negative for shortness of breath.   Cardiovascular:  Negative for chest pain, palpitations and leg swelling.  Musculoskeletal:  Positive for myalgias.  Skin:  Negative for color change.  All other systems reviewed and are negative.   Physical Exam Updated Vital Signs BP (!) 152/89   Pulse 68   Temp 98.4 F (36.9 C) (Oral)   Resp  11   Ht 5\' 6"  (1.676 m)   Wt 85.7 kg   SpO2 100%   BMI 30.51 kg/m  Physical Exam Vitals and nursing note reviewed.  Constitutional:      Appearance: Normal appearance.  HENT:     Head: Normocephalic and atraumatic.  Eyes:     Conjunctiva/sclera: Conjunctivae normal.  Cardiovascular:     Pulses:          Posterior tibial pulses are 2+ on the right side and 2+ on the left side.  Pulmonary:     Effort: Pulmonary effort is normal. No respiratory distress.  Abdominal:     Hernia: There is no hernia in the left inguinal area or right inguinal area.  Genitourinary:    Testes:        Left: Swelling not present.  Musculoskeletal:     Right lower leg: Normal.      Left lower leg: No edema.     Comments: Tenderness to palpation of L inguinal fold without adenopathy or hernia. Reproducible pain with palpation of the left thigh, moreso with palpation of medial aspect.   Lymphadenopathy:     Lower Body: No right inguinal adenopathy. No left inguinal adenopathy.  Skin:    General: Skin is warm and dry.     Capillary Refill: Capillary refill takes less than 2 seconds.     Comments: No overlying skin changes noted to the groin or entirety of L leg,   Neurological:     Mental Status: He is alert.  Psychiatric:        Mood and Affect: Mood normal.        Behavior: Behavior normal.     ED Results / Procedures / Treatments   Labs (all labs ordered are listed, but only abnormal results are displayed) Labs Reviewed - No data to display  EKG EKG Interpretation  Date/Time:  Saturday November 08 2022 09:20:05 EST Ventricular Rate:  69 PR Interval:  164 QRS Duration: 91 QT Interval:  427 QTC Calculation: 458 R Axis:   -18 Text Interpretation: Sinus rhythm Borderline left axis deviation new Nonspecific T abnrm, anterolateral leads Confirmed by Blanchie Dessert [phone]) on 11/08/2022 11:15:16 AM  Radiology DG HIP UNILAT WITH PELVIS 2-3 VIEWS LEFT  Result Date: 11/08/2022 CLINICAL DATA:  Left groin pain, no stated trauma EXAM: DG HIP (WITH OR WITHOUT PELVIS) 2-3V LEFT COMPARISON:  None Available. FINDINGS: There is no evidence of displaced hip fracture or dislocation. Severe left hip arthrosis, symmetric in appearance to the right hip included in single frontal view only. Nonobstructive pattern of overlying bowel gas. IMPRESSION: No evidence of displaced fracture or dislocation of the left hip. Severe left hip arthrosis, symmetric in appearance to the right hip included in single frontal view only. Electronically Signed   By: Delanna Ahmadi M.D.   On: 11/08/2022 11:05   VAS [place] LOWER EXTREMITY VENOUS (DVT) (7a-7p)  Result Date: 11/08/2022  Lower Venous DVT Study  Patient Name:  Cody [name]  Date of Exam:   11/08/2022 Medical Rec #: [id number]       Accession #:    [id number] Date of Birth: 04/19/1961        Patient Gender: M Patient Age:   27 years Exam Location:  [facility] Procedure:      VAS [place] LOWER EXTREMITY VENOUS (DVT) Referring Phys: Audrie Gallus Albertina Leise --------------------------------------------------------------------------------  Indications: Left groin pain x1 week, patient endorses remote history of multiple catheterizations.  Anticoagulation: Patient states he came off  multiple blood thinners recently due to concerns over bleeding/damage to eye. Comparison Study: No prior studies. Performing Technologist: Jean Rosenthal RDMS, RVT  Examination Guidelines: A complete evaluation includes B-mode imaging, spectral Doppler, color Doppler, and power Doppler as needed of all accessible portions of each vessel. Bilateral testing is considered an integral part of a complete examination. Limited examinations for reoccurring indications may be performed as noted. The reflux portion of the exam is performed with the patient in reverse Trendelenburg.  +-----+---------------+---------+-----------+----------+--------------+ RIGHTCompressibilityPhasicitySpontaneityPropertiesThrombus Aging +-----+---------------+---------+-----------+----------+--------------+ CFV  Full           Yes      Yes                                 +-----+---------------+---------+-----------+----------+--------------+   +---------+---------------+---------+-----------+----------+--------------+ LEFT     CompressibilityPhasicitySpontaneityPropertiesThrombus Aging +---------+---------------+---------+-----------+----------+--------------+ CFV      Full           Yes      Yes                                 +---------+---------------+---------+-----------+----------+--------------+ SFJ      Full                                                         +---------+---------------+---------+-----------+----------+--------------+ FV Prox  Full                                                        +---------+---------------+---------+-----------+----------+--------------+ FV Mid   Full                                                        +---------+---------------+---------+-----------+----------+--------------+ FV DistalFull                                                        +---------+---------------+---------+-----------+----------+--------------+ PFV      Full                                                        +---------+---------------+---------+-----------+----------+--------------+ POP      Full           Yes      Yes                                 +---------+---------------+---------+-----------+----------+--------------+ PTV      Full                                                        +---------+---------------+---------+-----------+----------+--------------+  PERO     Full                                                        +---------+---------------+---------+-----------+----------+--------------+ Gastroc  Full                                                        +---------+---------------+---------+-----------+----------+--------------+ EIV                     Yes      Yes                                 +---------+---------------+---------+-----------+----------+--------------+     Summary: RIGHT: - No evidence of common femoral vein obstruction.  LEFT: - There is no evidence of deep vein thrombosis in the lower extremity.  - No cystic structure found in the popliteal fossa.  - Triphasic arterial waveforms noted.  *See table(s) above for measurements and observations. Electronically signed by Orlie Pollen on 11/08/2022 at 10:48:04 AM.    Final     Procedures Procedures    Medications Ordered in ED Medications - No data to display  ED Course/ Medical Decision Making/  A&P                             Medical Decision Making Amount and/or Complexity of Data Reviewed Radiology: ordered.  Risk OTC drugs. Prescription drug management.  This patient is a 63 y.o. male  who presents to the ED for concern of left groin and thigh pain x 1 week.   Differential diagnoses prior to evaluation: The emergent differential diagnosis includes, but is not limited to,  DVT, peripheral artery disease, acute arterial occlusion, muscle strain, inguinal hernia, epididymitis, testicular torsion. This is not an exhaustive differential.   Past Medical History / Co-morbidities: CAD, retinal vein occlusion, hyperlipidemia, peripheral arterial disease, cocaine abuse, chronic chest pain, GERD, chronic right groin pain, hypertension  Additional history: Chart reviewed. Pertinent results include: Some records available in Care Everywhere. Most recent heart catheterization in June 2021, most recent admission in October 2021. Cannot find record of catheterization in which left femoral approach taken.   Physical Exam: Physical exam performed. The pertinent findings include: Tenderness to palpation over the left inguinal fold without hernia or adenopathy.  No scrotal swelling.  No edema of the leg.  Palpable PT pulse bilaterally.  No tenderness to palpation of the calf.  Lab Tests/Imaging studies: I personally interpreted labs/imaging and the pertinent results include: Ultrasound of the left lower extremity negative for DVT.  X-ray of the hip shows severe arthritis bilaterally. I agree with the radiologist interpretation.  Disposition: After consideration of the diagnostic results and the patients response to treatment, I feel that emergency department workup does not suggest an emergent condition requiring admission or immediate intervention beyond what has been performed at this time. The plan is: Discharged home with prescription for Medrol Dosepak and Tylenol.  Suspect pain likely  related to hip arthrosis.  There could be a questional component of  peripheral artery disease with patient has a history of.  It is reassuring to me that he has a strong distal pulse in the leg, no skin changes, and ultrasound was negative for DVT.  I anticipate that he can follow-up with his primary doctor and cardiologist for any further pain or medication changes.  The patient is safe for discharge and has been instructed to return immediately for worsening symptoms, change in symptoms or any other concerns.  Final Clinical Impression(s) / ED Diagnoses Final diagnoses:  Left groin pain  Arthritis of left hip    Rx / DC Orders ED Discharge Orders          Ordered    methylPREDNISolone (MEDROL DOSEPAK) 4 MG TBPK tablet        11/08/22 1126    acetaminophen (TYLENOL) 500 MG tablet  Every 6 hours PRN        11/08/22 1126           Portions of this report may have been transcribed using voice recognition software. Every effort was made to ensure accuracy; however, inadvertent computerized transcription errors may be present.    Estill Cotta 11/08/22 1148    Blanchie Dessert, MD 11/09/22 1119

## 2022-11-08 NOTE — Discharge Instructions (Addendum)
You were seen in the emergency department for left groin pain.  As we discussed, the ultrasound of your leg did not show any evidence of a blood clot. The x-ray shows you do have fairly significant arthritis in both of your hips. I am placing you on a course of some steroids as well as given you a prescription for tylenol, an over the counter pain medicine that is safe with your heart problems.  Please follow up with your heart doctor for management of your other medications.  Continue to monitor how you're doing and return to the ER for new or worsening symptoms.

## 2022-11-18 ENCOUNTER — Telehealth: Payer: Self-pay | Admitting: Cardiology

## 2022-11-18 NOTE — Telephone Encounter (Signed)
Pt c/o medication issue:  1. Name of Medication:  cloNIDine (CATAPRES) 0.1 MG tablet  2. How are you currently taking this medication (dosage and times per day)?   3. Are you having a reaction (difficulty breathing--STAT)?   4. What is your medication issue?    Patient states this medication is causing him to have a flash of light in the bottom of his eye. This concerns the patient because he states in the past when on the blood thinner Plavix it damaged his left eye. He would like to discuss alternate options and schedule an appointment for March with Dr. Geraldo Pitter if possible. Please advise.

## 2022-11-19 NOTE — Telephone Encounter (Signed)
That would be an atypical reaction to clonidine. Recommend follow up with Dr Geraldo Pitter and consider an ophthalmology appointment

## 2022-11-20 NOTE — Telephone Encounter (Signed)
Spoke with pt about Clonidine. He feels he is having eye issues due to this medication which he thinks is a blood thinner. Advised per Pharm D to see Dr. Geraldo Pitter to discuss medication and eye issues. Advised per Pharm D as well to make appt with Ophthalmologist. Pt has appt for 4-19 to see Dr, Geraldo Pitter. He verbalized understanding and had no further questions.

## 2022-11-20 NOTE — Telephone Encounter (Signed)
Patient is following up very concerned due to not hearing back from anyone. Please advise as soon as possible.

## 2023-01-23 ENCOUNTER — Ambulatory Visit: Payer: Medicare Other | Admitting: Cardiology

## 2023-01-31 ENCOUNTER — Other Ambulatory Visit: Payer: Self-pay | Admitting: Cardiology

## 2023-02-09 ENCOUNTER — Telehealth: Payer: Self-pay | Admitting: Cardiology

## 2023-02-09 DIAGNOSIS — I10 Essential (primary) hypertension: Secondary | ICD-10-CM

## 2023-02-09 DIAGNOSIS — I2510 Atherosclerotic heart disease of native coronary artery without angina pectoris: Secondary | ICD-10-CM

## 2023-02-09 MED ORDER — EZETIMIBE 10 MG PO TABS: 10.0000 mg | ORAL_TABLET | Freq: Every day | ORAL | 0 refills | Status: DC

## 2023-02-09 MED ORDER — AMLODIPINE BESYLATE 10 MG PO TABS: 10.0000 mg | ORAL_TABLET | Freq: Every day | ORAL | 0 refills | Status: DC

## 2023-02-09 MED ORDER — ATORVASTATIN CALCIUM 80 MG PO TABS: 80.0000 mg | ORAL_TABLET | Freq: Every day | ORAL | 0 refills | Status: DC

## 2023-02-09 MED ORDER — RANOLAZINE ER 500 MG PO TB12: 500.0000 mg | ORAL_TABLET | Freq: Two times a day (BID) | ORAL | 0 refills | Status: DC

## 2023-02-09 MED ORDER — CARVEDILOL 12.5 MG PO TABS: 12.5000 mg | ORAL_TABLET | Freq: Two times a day (BID) | ORAL | 0 refills | Status: DC

## 2023-02-09 MED ORDER — ASPIRIN 81 MG PO TBEC: 81.0000 mg | DELAYED_RELEASE_TABLET | Freq: Every day | ORAL | 0 refills | Status: DC

## 2023-02-09 MED ORDER — PANTOPRAZOLE SODIUM 40 MG PO TBEC: 40.0000 mg | DELAYED_RELEASE_TABLET | Freq: Every day | ORAL | 0 refills | Status: DC

## 2023-02-09 MED ORDER — CLONIDINE HCL 0.1 MG PO TABS: 0.1000 mg | ORAL_TABLET | Freq: Two times a day (BID) | ORAL | 0 refills | Status: DC

## 2023-02-09 NOTE — Telephone Encounter (Signed)
*  STAT* If patient is at the pharmacy, call can be transferred to refill team.   1. Which medications need to be refilled? (please list name of each medication and dose if known)  Aspirin 81 mg, Ezetimibe, Atorvastatin, Amlodipine, Carvedilol, Pantoprazole, Ranolazine-ER, and Clonidine-HCL  2. Which pharmacy/location (including street and city if local pharmacy) is medication to be sent to?  [facility] [place]  3. Do they need a 30 day or 90 day supply? 90 days and refills- please call today- out of medicine

## 2023-02-09 NOTE — Telephone Encounter (Signed)
Refills of Aspirin 81 mg, Amlodipine 10 mg, Zetia 10 mg, Lipitor 80 mg, Carvedilol 12.5 mg, Protonix 40 mg, Ranexa 500 mg, and Clonidine 0.1 mg sent to

## 2023-03-24 ENCOUNTER — Ambulatory Visit: Payer: Medicare Other | Attending: Cardiology | Admitting: Cardiology

## 2023-03-24 ENCOUNTER — Encounter: Payer: Self-pay | Admitting: Cardiology

## 2023-03-24 VITALS — BP 124/70 | HR 70 | Ht 66.0 in | Wt 177.2 lb

## 2023-03-24 DIAGNOSIS — E785 Hyperlipidemia, unspecified: Secondary | ICD-10-CM

## 2023-03-24 DIAGNOSIS — I10 Essential (primary) hypertension: Secondary | ICD-10-CM

## 2023-03-24 DIAGNOSIS — F17200 Nicotine dependence, unspecified, uncomplicated: Secondary | ICD-10-CM | POA: Diagnosis present

## 2023-03-24 DIAGNOSIS — I2510 Atherosclerotic heart disease of native coronary artery without angina pectoris: Secondary | ICD-10-CM

## 2023-03-24 NOTE — Patient Instructions (Addendum)
Medication Instructions:  Your physician recommends that you continue on your current medications as directed. Please refer to the Current Medication list given to you today.  *If you need a refill on your cardiac medications before your next appointment, please call your pharmacy*   Lab Work: None ordered If you have labs (blood work) drawn today and your tests are completely normal, you will receive your results only by: MyChart Message (if you have MyChart) OR A paper copy in the mail If you have any lab test that is abnormal or we need to change your treatment, we will call you to review the results.   Testing/Procedures: None ordered   Follow-Up: At [facility], you and your health needs are our priority.  As part of our continuing mission to provide you with exceptional heart care, we have created designated Provider Care Teams.  These Care Teams include your primary Cardiologist (physician) and Advanced Practice Providers (APPs -  Physician Assistants and Nurse Practitioners) who all work together to provide you with the care you need, when you need it.  We recommend signing up for the patient portal called "MyChart".  Sign up information is provided on this After Visit Summary.  MyChart is used to connect with patients for Virtual Visits (Telemedicine).  Patients are able to view lab/test results, encounter notes, upcoming appointments, etc.  Non-urgent messages can be sent to your provider as well.   To learn more about what you can do with MyChart, go to [email or web address].    Your next appointment:   12 month(s)  The format for your next appointment:   In Person  Provider:   Rajan Revankar, MD    Other Instructions none  Important Information About Sugar      

## 2023-03-24 NOTE — Progress Notes (Signed)
Cardiology Office Note:    Date:  03/24/2023   ID:  Cody [name], DOB Mar 16, 1961, MRN [id number]  PCP:  Ailene Ravel, MD  Cardiologist:  Garwin Brothers, MD   Referring MD: Ailene Ravel, MD    ASSESSMENT:    1. Primary hypertension   2. Essential hypertension   3. Coronary artery disease involving native coronary artery of native heart, unspecified whether angina present   4. Smoker    PLAN:    In order of problems listed above:  Coronary artery disease: Secondary prevention stressed with patient.  Importance of compliance with diet medication stressed any vocalized understanding. Essential hypertension: Blood pressure stable and diet was emphasized.  Lifestyle modification urged. Mixed dyslipidemia: He is fasting and I discussed blood work and he is agreeable. He was advised to walk at least half an hour a day 5 days a week and he promises to do so.  Smoking cessation counseling.  He has issues with erectile dysfunction and I told him to get established and discussed with primary care for these issues and get a complete evaluation and he agrees. I also told him that if he has any issues with my cardiac management that he could get a second opinion and is not keen on it at this time. Patient will be seen in follow-up appointment in 12 months or earlier if the patient has any concerns.    Medication Adjustments/Labs and Tests Ordered: Current medicines are reviewed at length with the patient today.  Concerns regarding medicines are outlined above.  No orders of the defined types were placed in this encounter.  No orders of the defined types were placed in this encounter.    No chief complaint on file.    History of Present Illness:    Cody [name] is a 62 y.o. male.  Patient has past medical history of coronary artery disease, essential hypertension and dyslipidemia.  He denies any problems at this time and takes care of activities of daily living.  No chest  pain orthopnea or PND.  He mentions to me that he has issues with erectile dysfunction.  At the time of my evaluation, the patient is alert awake oriented and in no distress.  Past Medical History:  Diagnosis Date   Ambulatory dysfunction 07/17/2020   CAD (coronary artery disease) 05/30/2020   CAP (community acquired pneumonia) 03/31/2020   Chest pain 09/24/2012   Nuclear study normal the patient demanded coronary angiography because of prior false-negative studies   Chronic chest pain with high risk for CAD    Cocaine abuse (HCC) 09/20/2012   Constipation 09/22/2012   Coronary artery disease    GERD (gastroesophageal reflux disease)    has stomach ulcers a while back   Groin pain, chronic, right    Hiatal hernia    HTN (hypertension)    Hypokalemia    Leukocytosis    PAD (peripheral artery disease) (HCC) 09/20/2012   Positive cardiac stress test 03/29/2020   PUD (peptic ulcer disease)    Retinal vein occlusion 09/24/2012   Sciatica of right side 05/30/2020   Smoker 09/20/2012   Visual field defect 09/21/2012    Past Surgical History:  Procedure Laterality Date   BIOPSY  07/19/2020   Procedure: BIOPSY;  Surgeon: Lynann Bologna, MD;  Location: [facility] ENDOSCOPY;  Service: Gastroenterology;;   CORONARY PRESSURE/FFR STUDY N/A 03/30/2020   Procedure: INTRAVASCULAR PRESSURE WIRE/FFR STUDY;  Surgeon: Kathleene Hazel, MD;  Location: [facility] INVASIVE CV LAB;  Service: Cardiovascular;  Laterality: N/A;   CORONARY PRESSURE/FFR STUDY N/A 07/17/2020   Procedure: INTRAVASCULAR PRESSURE WIRE/FFR STUDY;  Surgeon: Marykay Lex, MD;  Location: [facility] INVASIVE CV LAB;  Service: Cardiovascular;  Laterality: N/A;   CORONARY STENT PLACEMENT     ESOPHAGOGASTRODUODENOSCOPY (EGD) WITH PROPOFOL N/A 07/19/2020   Procedure: ESOPHAGOGASTRODUODENOSCOPY (EGD) WITH PROPOFOL;  Surgeon: Lynann Bologna, MD;  Location: [facility] ENDOSCOPY;  Service: Gastroenterology;  Laterality: N/A;   LEFT HEART CATH AND CORONARY  ANGIOGRAPHY N/A 03/30/2020   Procedure: LEFT HEART CATH AND CORONARY ANGIOGRAPHY;  Surgeon: Kathleene Hazel, MD;  Location: [facility] INVASIVE CV LAB;  Service: Cardiovascular;  Laterality: N/A;   LEFT HEART CATH AND CORONARY ANGIOGRAPHY N/A 07/17/2020   Procedure: LEFT HEART CATH AND CORONARY ANGIOGRAPHY;  Surgeon: Marykay Lex, MD;  Location: [facility] INVASIVE CV LAB;  Service: Cardiovascular;  Laterality: N/A;   LEFT HEART CATHETERIZATION WITH CORONARY ANGIOGRAM N/A 09/24/2012   Procedure: LEFT HEART CATHETERIZATION WITH CORONARY ANGIOGRAM;  Surgeon: Lesleigh Noe, MD;  Location: [facility] CATH LAB;  Service: Cardiovascular;  Laterality: N/A;    Current Medications: Current Meds  Medication Sig   acetaminophen (TYLENOL) 500 MG tablet Take 1 tablet (500 mg total) by mouth every 6 (six) hours as needed.   amLODipine (NORVASC) 10 MG tablet Take 1 tablet (10 mg total) by mouth daily.   aspirin EC 81 MG tablet Take 1 tablet (81 mg total) by mouth daily. Swallow whole.   atorvastatin (LIPITOR) 80 MG tablet Take 1 tablet (80 mg total) by mouth daily.   carvedilol (COREG) 12.5 MG tablet Take 1 tablet (12.5 mg total) by mouth 2 (two) times daily.   cloNIDine (CATAPRES) 0.1 MG tablet Take 0.1 mg by mouth daily.   ezetimibe (ZETIA) 10 MG tablet Take 1 tablet (10 mg total) by mouth daily.   methylPREDNISolone (MEDROL DOSEPAK) 4 MG TBPK tablet Take per package instructions   nitroGLYCERIN (NITROSTAT) 0.4 MG SL tablet Place 1 tablet (0.4 mg total) under the tongue every 5 (five) minutes as needed for chest pain.   pantoprazole (PROTONIX) 40 MG tablet Take 1 tablet (40 mg total) by mouth daily.   ranolazine (RANEXA) 500 MG 12 hr tablet Take 1 tablet (500 mg total) by mouth 2 (two) times daily.     Allergies:   Plavix [clopidogrel], Iodinated contrast media, Other, and Peanuts [peanut oil]   Social History   Socioeconomic History   Marital status: Married    Spouse name: Not on file   Number of  children: Not on file   Years of education: Not on file   Highest education level: Not on file  Occupational History   Not on file  Tobacco Use   Smoking status: Every Day    Types: Cigarettes   Smokeless tobacco: Never  Vaping Use   Vaping Use: Never used  Substance and Sexual Activity   Alcohol use: Yes    Alcohol/week: 3.0 - 4.0 standard drinks of alcohol    Types: 2 Cans of beer, 1 - 2 Shots of liquor per week   Drug use: Yes    Types: Cocaine   Sexual activity: Not on file  Other Topics Concern   Not on file  Social History Narrative   Not on file   Social Determinants of Health   Financial Resource Strain: Not on file  Food Insecurity: Not on file  Transportation Needs: Not on file  Physical Activity: Not on file  Stress: Not on file  Social Connections: Not  on file     Family History: The patient's family history includes CAD in his brother; Diabetes in his father; Hypertension in his mother.  ROS:   Please see the history of present illness.    All other systems reviewed and are negative.  EKGs/Labs/Other Studies Reviewed:    The following studies were reviewed today: I discussed my findings with the patient at length.   Recent Labs: No results found for requested labs within last 365 days.  Recent Lipid Panel    Component Value Date/Time   CHOL 122 03/24/2022 0906   TRIG 82 03/24/2022 0906   HDL 43 03/24/2022 0906   CHOLHDL 2.8 03/24/2022 0906   CHOLHDL 4.2 07/16/2020 1200   VLDL 16 07/16/2020 1200   LDLCALC 63 03/24/2022 0906    Physical Exam:    VS:  BP 124/70   Pulse 70   Ht 5\' 6"  (1.676 m)   Wt 177 lb 3.2 oz (80.4 kg)   SpO2 97%   BMI 28.60 kg/m     Wt Readings from Last 3 Encounters:  03/24/23 177 lb 3.2 oz (80.4 kg)  11/08/22 189 lb (85.7 kg)  03/24/22 178 lb (80.7 kg)     GEN: Patient is in no acute distress HEENT: Normal NECK: No JVD; No carotid bruits LYMPHATICS: No lymphadenopathy CARDIAC: Hear sounds regular, 2/6  systolic murmur at the apex. RESPIRATORY:  Clear to auscultation without rales, wheezing or rhonchi  ABDOMEN: Soft, non-tender, non-distended MUSCULOSKELETAL:  No edema; No deformity  SKIN: Warm and dry NEUROLOGIC:  Alert and oriented x 3 PSYCHIATRIC:  Normal affect   Signed, Garwin Brothers, MD  03/24/2023 11:23 AM    [facility]

## 2023-03-25 LAB — COMPREHENSIVE METABOLIC PANEL
ALT: 20 IU/L (ref 0–44)
AST: 17 IU/L (ref 0–40)
Albumin: 4.3 g/dL (ref 3.8–4.9)
Alkaline Phosphatase: 98 IU/L (ref 44–121)
BUN/Creatinine Ratio: 11 (ref 10–24)
BUN: 11 mg/dL (ref 8–27)
Bilirubin Total: 0.5 mg/dL (ref 0.0–1.2)
CO2: 25 mmol/L (ref 20–29)
Calcium: 9 mg/dL (ref 8.6–10.2)
Chloride: 99 mmol/L (ref 96–106)
Creatinine, Ser: 1.03 mg/dL (ref 0.76–1.27)
Globulin, Total: 3.2 g/dL (ref 1.5–4.5)
Glucose: 89 mg/dL (ref 70–99)
Potassium: 3.8 mmol/L (ref 3.5–5.2)
Sodium: 142 mmol/L (ref 134–144)
Total Protein: 7.5 g/dL (ref 6.0–8.5)
eGFR: 83 mL/min/{1.73_m2} (ref >59)

## 2023-03-25 LAB — LIPID PANEL
Chol/HDL Ratio: 3 ratio (ref 0.0–5.0)
Cholesterol, Total: 134 mg/dL (ref 100–199)
HDL: 44 mg/dL (ref >39)
LDL Chol Calc (NIH): 72 mg/dL (ref 0–99)
Triglycerides: 94 mg/dL (ref 0–149)
VLDL Cholesterol Cal: 18 mg/dL (ref 5–40)

## 2023-03-25 LAB — TSH: TSH: 1.55 u[IU]/mL (ref 0.450–4.500)

## 2023-03-26 ENCOUNTER — Telehealth: Payer: Self-pay

## 2023-03-26 NOTE — Telephone Encounter (Signed)
-----   Message from Garwin Brothers, MD sent at 03/25/2023  3:31 PM EDT ----- The results of the study is unremarkable. Please inform patient. I will discuss in detail at next appointment. Cc  primary care/referring physician Garwin Brothers, MD 03/25/2023 3:31 PM

## 2023-03-26 NOTE — Telephone Encounter (Signed)
No VM set up.

## 2023-03-27 ENCOUNTER — Telehealth: Payer: Self-pay

## 2023-03-27 DIAGNOSIS — I10 Essential (primary) hypertension: Secondary | ICD-10-CM

## 2023-03-27 DIAGNOSIS — I2510 Atherosclerotic heart disease of native coronary artery without angina pectoris: Secondary | ICD-10-CM

## 2023-03-27 MED ORDER — RANOLAZINE ER 500 MG PO TB12: 500.0000 mg | ORAL_TABLET | Freq: Two times a day (BID) | ORAL | 3 refills | Status: DC

## 2023-03-27 MED ORDER — EZETIMIBE 10 MG PO TABS: 10.0000 mg | ORAL_TABLET | Freq: Every day | ORAL | 3 refills | Status: DC

## 2023-03-27 MED ORDER — CLONIDINE HCL 0.1 MG PO TABS: 0.1000 mg | ORAL_TABLET | Freq: Every day | ORAL | 3 refills | Status: DC

## 2023-03-27 MED ORDER — NITROGLYCERIN 0.4 MG SL SUBL: 0.4000 mg | SUBLINGUAL_TABLET | SUBLINGUAL | 6 refills | Status: DC | PRN

## 2023-03-27 MED ORDER — PANTOPRAZOLE SODIUM 40 MG PO TBEC: 40.0000 mg | DELAYED_RELEASE_TABLET | Freq: Every day | ORAL | 3 refills | Status: DC

## 2023-03-27 MED ORDER — AMLODIPINE BESYLATE 10 MG PO TABS: 10.0000 mg | ORAL_TABLET | Freq: Every day | ORAL | 3 refills | Status: DC

## 2023-03-27 MED ORDER — ATORVASTATIN CALCIUM 80 MG PO TABS: 80.0000 mg | ORAL_TABLET | Freq: Every day | ORAL | 3 refills | Status: DC

## 2023-03-27 MED ORDER — ASPIRIN 81 MG PO TBEC: 81.0000 mg | DELAYED_RELEASE_TABLET | Freq: Every day | ORAL | 3 refills | Status: DC

## 2023-03-27 MED ORDER — CARVEDILOL 12.5 MG PO TABS: 12.5000 mg | ORAL_TABLET | Freq: Two times a day (BID) | ORAL | 3 refills | Status: DC

## 2023-03-27 NOTE — Telephone Encounter (Signed)
-----   Message from Rajan R Revankar, MD sent at 03/25/2023  3:31 PM EDT ----- The results of the study is unremarkable. Please inform patient. I will discuss in detail at next appointment. Cc  primary care/referring physician Rajan R Revankar, MD 03/25/2023 3:31 PM  

## 2023-06-03 ENCOUNTER — Telehealth: Payer: Self-pay | Admitting: Cardiology

## 2023-06-03 MED ORDER — AMLODIPINE BESYLATE 10 MG PO TABS: 10.0000 mg | ORAL_TABLET | Freq: Every day | ORAL | 3 refills | Status: DC

## 2023-06-03 MED ORDER — ASPIRIN 81 MG PO TBEC: 81.0000 mg | DELAYED_RELEASE_TABLET | Freq: Every day | ORAL | 3 refills | Status: DC

## 2023-06-03 MED ORDER — CARVEDILOL 12.5 MG PO TABS: 12.5000 mg | ORAL_TABLET | Freq: Two times a day (BID) | ORAL | 3 refills | Status: DC

## 2023-06-03 MED ORDER — EZETIMIBE 10 MG PO TABS: 10.0000 mg | ORAL_TABLET | Freq: Every day | ORAL | 3 refills | Status: DC

## 2023-06-03 NOTE — Telephone Encounter (Signed)
*  STAT* If patient is at the pharmacy, call can be transferred to refill team.   1. Which medications need to be refilled? (please list name of each medication and dose if known)   amLODipine (NORVASC) 10 MG tablet    aspirin EC 81 MG tablet    carvedilol (COREG) 12.5 MG tablet    ezetimibe (ZETIA) 10 MG tablet   2. Which pharmacy/location (including street and city if local pharmacy) is medication to be sent to?  [id number] - [place], [place] - [address] [facility]      3. Do they need a 30 day or 90 day supply? 90 day    Pt is completely out of this medication

## 2023-06-03 NOTE — Telephone Encounter (Signed)
Refill has been sent.  °

## 2023-07-03 ENCOUNTER — Telehealth: Payer: Self-pay | Admitting: Cardiology

## 2023-07-03 NOTE — Telephone Encounter (Signed)
Spoke with pt, he reports a blood pressure of 174 this morning. Yesterday he had a funny feeling in his forehead. Because his blood pressure was high, he took 2 NTG and now reports feeling better. His last blood pressure was 114/64. Cautioned patient about taking too many NTG. He reports he is feeling better now and if he has any other problems he will go to the ER. Patient denies chest pain or other symptoms.

## 2023-07-03 NOTE — Telephone Encounter (Signed)
Pt c/o BP issue: STAT if pt c/o blurred vision, one-sided weakness or slurred speech  1. What are your last 5 BP readings? 125/74  2. Are you having any other symptoms (ex. Dizziness, headache, blurred vision, passed out)? States he has a weird feeling in his forehead   3. What is your BP issue? Pt states he has not been feeling right and that he took a Nitroglycerin hoping it would help. Please advise

## 2023-08-07 ENCOUNTER — Telehealth: Payer: Self-pay | Admitting: Cardiology

## 2023-08-07 NOTE — Telephone Encounter (Signed)
Error

## 2023-08-31 ENCOUNTER — Telehealth: Payer: Self-pay | Admitting: Cardiology

## 2023-08-31 DIAGNOSIS — I10 Essential (primary) hypertension: Secondary | ICD-10-CM

## 2023-08-31 DIAGNOSIS — I2510 Atherosclerotic heart disease of native coronary artery without angina pectoris: Secondary | ICD-10-CM

## 2023-08-31 MED ORDER — ATORVASTATIN CALCIUM 80 MG PO TABS: 80.0000 mg | ORAL_TABLET | Freq: Every day | ORAL | 1 refills | Status: DC

## 2023-08-31 MED ORDER — PANTOPRAZOLE SODIUM 40 MG PO TBEC: 40.0000 mg | DELAYED_RELEASE_TABLET | Freq: Every day | ORAL | 1 refills | Status: DC

## 2023-08-31 NOTE — Telephone Encounter (Signed)
Refill of Atorvastatin 80 mg and Pantoprazole 40 mg sent to [facility] [place].

## 2023-08-31 NOTE — Telephone Encounter (Signed)
  Patient is out of medication  *STAT* If patient is at the pharmacy, call can be transferred to refill team.   1. Which medications need to be refilled? (please list name of each medication and dose if known)   atorvastatin (LIPITOR) 80 MG tablet    pantoprazole (PROTONIX) 40 MG tablet     2. Would you like to learn more about the convenience, safety, & potential cost savings by using the [facility]? no    3. Are you open to using the [facility] no   4. Which pharmacy/location (including street and city if local pharmacy) is medication to be sent to? [id number] - [place], [place] - [address] [facility]     5. Do they need a 30 day or 90 day supply? 90  Patient is completely out of medication

## 2023-09-23 ENCOUNTER — Other Ambulatory Visit: Payer: Self-pay

## 2023-09-23 ENCOUNTER — Encounter (HOSPITAL_COMMUNITY): Payer: Self-pay

## 2023-09-23 ENCOUNTER — Observation Stay (HOSPITAL_COMMUNITY)
Admission: EM | Admit: 2023-09-23 | Discharge: 2023-09-25 | Disposition: A | Discharge status: Home / Self Care | Payer: Medicare Other | Source: Other Acute Inpatient Hospital | Attending: Cardiovascular Disease | Admitting: Cardiovascular Disease

## 2023-09-23 ENCOUNTER — Encounter (HOSPITAL_COMMUNITY): Payer: Self-pay | Admitting: Internal Medicine

## 2023-09-23 DIAGNOSIS — I10 Essential (primary) hypertension: Secondary | ICD-10-CM | POA: Insufficient documentation

## 2023-09-23 DIAGNOSIS — Z79899 Other long term (current) drug therapy: Secondary | ICD-10-CM | POA: Diagnosis not present

## 2023-09-23 DIAGNOSIS — Z7982 Long term (current) use of aspirin: Secondary | ICD-10-CM | POA: Diagnosis not present

## 2023-09-23 DIAGNOSIS — I200 Unstable angina: Secondary | ICD-10-CM | POA: Diagnosis not present

## 2023-09-23 DIAGNOSIS — F17210 Nicotine dependence, cigarettes, uncomplicated: Secondary | ICD-10-CM | POA: Diagnosis not present

## 2023-09-23 DIAGNOSIS — E7800 Pure hypercholesterolemia, unspecified: Secondary | ICD-10-CM

## 2023-09-23 DIAGNOSIS — I25110 Atherosclerotic heart disease of native coronary artery with unstable angina pectoris: Secondary | ICD-10-CM | POA: Insufficient documentation

## 2023-09-23 DIAGNOSIS — Z955 Presence of coronary angioplasty implant and graft: Secondary | ICD-10-CM | POA: Insufficient documentation

## 2023-09-23 DIAGNOSIS — R079 Chest pain, unspecified: Secondary | ICD-10-CM

## 2023-09-23 DIAGNOSIS — N401 Enlarged prostate with lower urinary tract symptoms: Secondary | ICD-10-CM

## 2023-09-23 LAB — CBC
HCT: 36.1 % — ABNORMAL LOW (ref 39.0–52.0)
Hemoglobin: 12 g/dL — ABNORMAL LOW (ref 13.0–17.0)
MCH: 28.8 pg (ref 26.0–34.0)
MCHC: 33.2 g/dL (ref 30.0–36.0)
MCV: 86.8 fL (ref 80.0–100.0)
Platelets: 181 10*3/uL (ref 150–400)
RBC: 4.16 MIL/uL — ABNORMAL LOW (ref 4.22–5.81)
RDW: 13.1 % (ref 11.5–15.5)
WBC: 11 10*3/uL — ABNORMAL HIGH (ref 4.0–10.5)
nRBC: 0 % (ref 0.0–0.2)

## 2023-09-23 LAB — CREATININE, SERUM
Creatinine, Ser: 1 mg/dL (ref 0.61–1.24)
GFR, Estimated: >60 mL/min (ref >60)

## 2023-09-23 MED ORDER — NITROGLYCERIN 0.4 MG SL SUBL
0.4000 mg | SUBLINGUAL_TABLET | SUBLINGUAL | Status: DC | PRN
Start: 2023-09-23 — End: 2023-09-25
  Filled 2023-09-23: qty 1

## 2023-09-23 MED ORDER — NITROGLYCERIN 0.4 MG SL SUBL: 0.4000 mg | SUBLINGUAL_TABLET | SUBLINGUAL | Status: DC | PRN

## 2023-09-23 MED ORDER — CARVEDILOL 12.5 MG PO TABS
12.5000 mg | ORAL_TABLET | Freq: Two times a day (BID) | ORAL | Status: DC
Start: 2023-09-23 — End: 2023-09-25
  Administered 2023-09-23 – 2023-09-25 (×4): 12.5 mg via ORAL
  Filled 2023-09-23 (×4): qty 1

## 2023-09-23 MED ORDER — ATORVASTATIN CALCIUM 80 MG PO TABS
80.0000 mg | ORAL_TABLET | Freq: Every day | ORAL | Status: DC
Start: 2023-09-24 — End: 2023-09-25
  Administered 2023-09-24 – 2023-09-25 (×2): 80 mg via ORAL
  Filled 2023-09-23 (×2): qty 1

## 2023-09-23 MED ORDER — PANTOPRAZOLE SODIUM 40 MG PO TBEC
40.0000 mg | DELAYED_RELEASE_TABLET | Freq: Every day | ORAL | Status: DC
Start: 2023-09-23 — End: 2023-09-25
  Administered 2023-09-24 – 2023-09-25 (×2): 40 mg via ORAL
  Filled 2023-09-23 (×2): qty 1

## 2023-09-23 MED ORDER — SODIUM CHLORIDE 0.9 % WEIGHT BASED INFUSION
3.0000 mL/kg/h | INTRAVENOUS | Status: DC
  Administered 2023-09-24: 3 mL/kg/h via INTRAVENOUS

## 2023-09-23 MED ORDER — ENOXAPARIN SODIUM 40 MG/0.4ML IJ SOSY
40.0000 mg | PREFILLED_SYRINGE | INTRAMUSCULAR | Status: DC
  Administered 2023-09-23: 40 mg via SUBCUTANEOUS
  Filled 2023-09-23: qty 0.4

## 2023-09-23 MED ORDER — ONDANSETRON HCL 4 MG/2ML IJ SOLN: 4.0000 mg | Freq: Four times a day (QID) | INTRAMUSCULAR | Status: DC | PRN

## 2023-09-23 MED ORDER — EZETIMIBE 10 MG PO TABS
10.0000 mg | ORAL_TABLET | Freq: Every day | ORAL | Status: DC
Start: 2023-09-23 — End: 2023-09-25
  Administered 2023-09-24 – 2023-09-25 (×2): 10 mg via ORAL
  Filled 2023-09-23 (×2): qty 1

## 2023-09-23 MED ORDER — CLONIDINE HCL 0.1 MG PO TABS
0.1000 mg | ORAL_TABLET | Freq: Every day | ORAL | Status: DC
Start: 2023-09-23 — End: 2023-09-23

## 2023-09-23 MED ORDER — ACETAMINOPHEN 325 MG PO TABS
650.0000 mg | ORAL_TABLET | ORAL | Status: DC | PRN
  Administered 2023-09-24: 650 mg via ORAL
  Filled 2023-09-23: qty 2

## 2023-09-23 MED ORDER — AMLODIPINE BESYLATE 10 MG PO TABS
10.0000 mg | ORAL_TABLET | Freq: Every day | ORAL | Status: DC
Start: 2023-09-24 — End: 2023-09-25
  Administered 2023-09-24 – 2023-09-25 (×2): 10 mg via ORAL
  Filled 2023-09-23 (×2): qty 1

## 2023-09-23 MED ORDER — ASPIRIN 81 MG PO TBEC
81.0000 mg | DELAYED_RELEASE_TABLET | Freq: Every day | ORAL | Status: DC
  Administered 2023-09-24 – 2023-09-25 (×2): 81 mg via ORAL
  Filled 2023-09-23 (×2): qty 1

## 2023-09-23 MED ORDER — SODIUM CHLORIDE 0.9 % WEIGHT BASED INFUSION
1.0000 mL/kg/h | INTRAVENOUS | Status: DC
  Administered 2023-09-24: 1 mL/kg/h via INTRAVENOUS

## 2023-09-23 MED ORDER — RANOLAZINE ER 500 MG PO TB12
500.0000 mg | ORAL_TABLET | Freq: Two times a day (BID) | ORAL | Status: DC
Start: 2023-09-23 — End: 2023-09-25
  Administered 2023-09-23 – 2023-09-25 (×4): 500 mg via ORAL
  Filled 2023-09-23 (×4): qty 1

## 2023-09-23 NOTE — H&P (Signed)
Cardiology Admission History and Physical   Patient ID: Cody [name] MRN: [id number]; DOB: September 08, 1961   Admission date: 09/23/2023  PCP:  Ailene Ravel, MD   [facility] Providers Cardiologist:  Thomasene Ripple, DO         Chief Complaint:  Chest Pain   Patient Profile:   Cody [name] is a 62 y.o. male with a past medical history of CAD, HTN, HLD, history of cocaine use, GERD, PAD who is being seen 09/23/2023 for the evaluation of chest pain.  History of Present Illness:   Cody [name] is a 62 year old male with above medical history who is followed by Dr. Tomie China. Per chart review, patient previously has had 4 stents placed total. He underwent LHC on 03/10/20 that showed patent stent in the mid LAD without restenosis, moderate mid LAD stenosis prior to the stent, moderate mid circumflex stenosis that was no hemodynamically significant by DFR, overlapping mid and distal RCA stents without restenosis. Later, echocardiogram on 07/14/20 showed EF 55-60%, no regional wall motion abnormalities, moderate LVH, grade II DD, normal RV function. He underwent cardiac catheterization on 07/17/20 that showed stable three-vessel CAD with patent RCA stents, stable patent LAD stent with roughly 50% lesion just proximal to the stent, and stable 70% mid Lcx lesion (DFR negative). There were no obvious culprit lesions to explain patient's chest pain.  More recently, patient underwent nuclear stress test on 03/11/22 that was a normal, low risk study with EF 48%. Echocardiogram on 03/26/22 showed EF 55-60%, no regional wall motion abnormalities, mild LVH, grade I DD, normal RV function.   Patient was last seen by Dr. Tomie China on 03/24/23. At that time, patient was doing well. Denied chest pain.   He presented to the ED at [facility] on 09/22/23 complaining of 2 days of left sided chest pain, diaphoresis. In the ED, troponin were negative x3. K 3.4, creatinine 0.90, pro-BNP 163. D-Dimer elevated to 4.22.  CTA chest showed no PE, no thoracic aortic aneurysm/dissection. When at [place], patient was seen by Dr. Maddireddy [facility]. As patient continued to have episodes of chest pain, it was recommended that he be admitted to [facility] for possible cardiac catheterization.   On interview, patient is very frustrated. He is frustrated that he has not been able to eat anything today because he is pending heart catheterization. He reports that he has been taking medications that he cannot take on an empty stomach. He reports that he has been having intermittent episodes of feeling cold, followed by feeling hot and sweaty for the past 2 weeks. Reports that in the past before he had stents placed to his heart, he had similar feelings of sweatiness. Reports that about 1 week ago, he also started to have chest pain. Chest pain would go away when he took his medications, but would return as his medications wore off. Reported feeling like his medications were "hiding" the true problem. He denies chest pain on exertion, but admits that he is not very active in his day to day life.  Patient reports that he will not take plavix because in the past, plavix caused him to have vision problems with his eye. He reports that he will not take plavix or any medications in the "plavix family". Attempted to discuss that if we are to put in a stent, he will need to be on DAPT for a period of time, likely a year, after stenting. Patient again became very frustrated and told me that he  would take whatever medications are needed after stenting. However, he reported that he would refuse to take plavix because he believes it will make him go blind.    Past Medical History:  Diagnosis Date   Ambulatory dysfunction 07/17/2020   CAD (coronary artery disease) 05/30/2020   CAP (community acquired pneumonia) 03/31/2020   Chest pain 09/24/2012   Nuclear study normal the patient demanded coronary angiography because of prior false-negative  studies   Chronic chest pain with high risk for CAD    Cocaine abuse (HCC) 09/20/2012   Constipation 09/22/2012   Coronary artery disease    GERD (gastroesophageal reflux disease)    has stomach ulcers a while back   Groin pain, chronic, right    Hiatal hernia    HTN (hypertension)    Hypokalemia    Leukocytosis    PAD (peripheral artery disease) (HCC) 09/20/2012   Positive cardiac stress test 03/29/2020   PUD (peptic ulcer disease)    Retinal vein occlusion 09/24/2012   Sciatica of right side 05/30/2020   Smoker 09/20/2012   Visual field defect 09/21/2012    Past Surgical History:  Procedure Laterality Date   BIOPSY  07/19/2020   Procedure: BIOPSY;  Surgeon: Lynann Bologna, MD;  Location: [facility] ENDOSCOPY;  Service: Gastroenterology;;   CORONARY PRESSURE/FFR STUDY N/A 03/30/2020   Procedure: INTRAVASCULAR PRESSURE WIRE/FFR STUDY;  Surgeon: Kathleene Hazel, MD;  Location: [facility] INVASIVE CV LAB;  Service: Cardiovascular;  Laterality: N/A;   CORONARY PRESSURE/FFR STUDY N/A 07/17/2020   Procedure: INTRAVASCULAR PRESSURE WIRE/FFR STUDY;  Surgeon: Marykay Lex, MD;  Location: [facility] INVASIVE CV LAB;  Service: Cardiovascular;  Laterality: N/A;   CORONARY STENT PLACEMENT     ESOPHAGOGASTRODUODENOSCOPY (EGD) WITH PROPOFOL N/A 07/19/2020   Procedure: ESOPHAGOGASTRODUODENOSCOPY (EGD) WITH PROPOFOL;  Surgeon: Lynann Bologna, MD;  Location: [facility] ENDOSCOPY;  Service: Gastroenterology;  Laterality: N/A;   LEFT HEART CATH AND CORONARY ANGIOGRAPHY N/A 03/30/2020   Procedure: LEFT HEART CATH AND CORONARY ANGIOGRAPHY;  Surgeon: Kathleene Hazel, MD;  Location: [facility] INVASIVE CV LAB;  Service: Cardiovascular;  Laterality: N/A;   LEFT HEART CATH AND CORONARY ANGIOGRAPHY N/A 07/17/2020   Procedure: LEFT HEART CATH AND CORONARY ANGIOGRAPHY;  Surgeon: Marykay Lex, MD;  Location: [facility] INVASIVE CV LAB;  Service: Cardiovascular;  Laterality: N/A;   LEFT HEART CATHETERIZATION WITH CORONARY ANGIOGRAM N/A  09/24/2012   Procedure: LEFT HEART CATHETERIZATION WITH CORONARY ANGIOGRAM;  Surgeon: Lesleigh Noe, MD;  Location: [facility] CATH LAB;  Service: Cardiovascular;  Laterality: N/A;     Medications Prior to Admission: Prior to Admission medications   Medication Sig Start Date End Date Taking? Authorizing Provider  acetaminophen (TYLENOL) 500 MG tablet Take 1 tablet (500 mg total) by mouth every 6 (six) hours as needed. 11/08/22   Roemhildt, Lorin T, PA-C  amLODipine (NORVASC) 10 MG tablet Take 1 tablet (10 mg total) by mouth daily. 06/03/23   Revankar, Aundra Dubin, MD  aspirin EC 81 MG tablet Take 1 tablet (81 mg total) by mouth daily. Swallow whole. 06/03/23   Revankar, Aundra Dubin, MD  atorvastatin (LIPITOR) 80 MG tablet Take 1 tablet (80 mg total) by mouth daily. 08/31/23   Revankar, Aundra Dubin, MD  carvedilol (COREG) 12.5 MG tablet Take 1 tablet (12.5 mg total) by mouth 2 (two) times daily. 06/03/23   Revankar, Aundra Dubin, MD  cloNIDine (CATAPRES) 0.1 MG tablet Take 1 tablet (0.1 mg total) by mouth daily. 03/27/23   Revankar, Aundra Dubin, MD  ezetimibe (ZETIA)  10 MG tablet Take 1 tablet (10 mg total) by mouth daily. 06/03/23   Revankar, Aundra Dubin, MD  methylPREDNISolone (MEDROL DOSEPAK) 4 MG TBPK tablet Take per package instructions 11/08/22   Roemhildt, Lorin T, PA-C  nitroGLYCERIN (NITROSTAT) 0.4 MG SL tablet Place 1 tablet (0.4 mg total) under the tongue every 5 (five) minutes as needed for chest pain. 03/27/23   Revankar, Aundra Dubin, MD  pantoprazole (PROTONIX) 40 MG tablet Take 1 tablet (40 mg total) by mouth daily. 08/31/23   Revankar, Aundra Dubin, MD  ranolazine (RANEXA) 500 MG 12 hr tablet Take 1 tablet (500 mg total) by mouth 2 (two) times daily. 03/27/23   Revankar, Aundra Dubin, MD     Allergies:    Allergies  Allergen Reactions   Plavix [Clopidogrel] Other (See Comments)    Damaged left eye completely   Iodinated Contrast Media Nausea And Vomiting and Other (See Comments)    Caused the patient to vomit blood- Pt reports  that dye caused him to spit up blood   Other Other (See Comments)    Unnamed med for high B/P- Caused profuse sweating and affected the mind   Peanuts [Peanut Oil] Hives    Social History:   Social History   Socioeconomic History   Marital status: Married    Spouse name: Not on file   Number of children: Not on file   Years of education: Not on file   Highest education level: Not on file  Occupational History   Not on file  Tobacco Use   Smoking status: Every Day    Types: Cigarettes   Smokeless tobacco: Never  Vaping Use   Vaping status: Never Used  Substance and Sexual Activity   Alcohol use: Yes    Alcohol/week: 3.0 - 4.0 standard drinks of alcohol    Types: 2 Cans of beer, 1 - 2 Shots of liquor per week   Drug use: Yes    Types: Cocaine   Sexual activity: Not on file  Other Topics Concern   Not on file  Social History Narrative   Not on file   Social Drivers of Health   Financial Resource Strain: Not on file  Food Insecurity: No Food Insecurity (09/23/2023)   Hunger Vital Sign    Worried About Running Out of Food in the Last Year: Never true    Ran Out of Food in the Last Year: Never true  Transportation Needs: No Transportation Needs (09/23/2023)   PRAPARE - Administrator, Civil Service (Medical): No    Lack of Transportation (Non-Medical): No  Physical Activity: Not on file  Stress: Not on file  Social Connections: Not on file  Intimate Partner Violence: Not At Risk (09/23/2023)   Humiliation, Afraid, Rape, and Kick questionnaire    Fear of Current or Ex-Partner: No    Emotionally Abused: No    Physically Abused: No    Sexually Abused: No    Family History:   The patient's family history includes CAD in his brother; Diabetes in his father; Hypertension in his mother.    ROS:  Please see the history of present illness.  All other ROS reviewed and negative.     Physical Exam/Data:   Vitals:   09/23/23 1555  BP: (!) 147/84  Pulse: 61   Resp: 18  Temp: 98 F (36.7 C)  TempSrc: Oral  SpO2: 95%  Weight: 77.6 kg  Height: 5\' 5"  (1.651 m)   No intake or output data  in the 24 hours ending 09/23/23 1620    09/23/2023    3:55 PM 03/24/2023   11:11 AM 11/08/2022    9:12 AM  Last 3 Weights  Weight (lbs) 171 lb 177 lb 3.2 oz 189 lb  Weight (kg) 77.565 kg 80.377 kg 85.73 kg     Body mass index is 28.46 kg/m.  General:  Well nourished, well developed, in no acute distress. Sitting comfortably on the side of the bed  HEENT: normal Neck: no JVD Vascular: Radial pulses 2+ bilaterally   Cardiac:  normal S1, S2; RRR; no murmur Lungs:  clear to auscultation bilaterally, no wheezing, rhonchi or rales. Normal work of breathing on room air  Abd: soft, nontender  Ext: no edema in BLE Musculoskeletal:  No deformities, BUE and BLE strength normal and equal Skin: warm and dry  Neuro:  CNs 2-12 intact, no focal abnormalities noted Psych:  Frustrated mood, appears anxious    Relevant CV Studies: Cardiac Studies & Procedures   CARDIAC CATHETERIZATION  CARDIAC CATHETERIZATION 07/17/2020  Narrative  The left ventricular systolic function is normal. The left ventricular ejection fraction is 45-50% by visual estimate.  LV end diastolic pressure is normal.  Prox RCA-1 lesion is 30% stenosed.  Prox RCA-2 STENT is 10% stenosed.  Prox RCA to Mid RCA lesion is 30% stenosed.  Mid RCA to Dist RCA STENT is 20% stenosed.  Prox LAD lesion is 50% stenosed. - FFR 0.84 (Not significant)  Prox LAD to Mid LAD STENT is 5% stenosed.  Prox Cx lesion is 70% stenosed with 30% stenosed side branch in 1st Mrg. - DFR 0.97 (Negative)  SUMMARY  Stable three-vessel CAD with patent RCA stents and minimal diffuse disease in the RCA, stable patent LAD stent with roughly 50% (FFR negative) lesion just proximal to the stent, stable roughly 70% mid LCx (DFR negative) lesion.  Poor imaging on LV gram.  EF appears to be somewhat down, but this is not  consistent with echocardiogram findings.  No obvious culprit lesion to explain resting chest pain.  Recommend evaluated nonischemic/noncardiac etiology.   RECOMMENDATIONS  Return to nursing unit for ongoing care.  Continue aggressive medical management.  Considered nonanginal cause for chest pain that was resting upon arrival to the Cath Lab.  Although angiographically the most significant potential lesion is the LCx lesion this was actually grossly normal by DFR.   Bryan Lemma, MD  Findings Coronary Findings Diagnostic  Dominance: Right  Left Anterior Descending Prox LAD lesion is 50% stenosed. The lesion is discrete. Pressure wire/FFR was performed on the lesion. FFR: 0.84. Prox LAD to Mid LAD lesion is 5% stenosed. The lesion was previously treated using a drug eluting stent over 2 years ago.  First Diagonal Branch Vessel is small in size.  Lateral First Diagonal Branch Vessel is small in size.  Left Circumflex Vessel is large. Prox Cx lesion is 70% stenosed with 30% stenosed side branch in 1st Mrg. The lesion is focal and eccentric. Pressure wire/FFR was performed on the lesion. 0.97 DFR 0.97 - NOT significant.  Third Obtuse Marginal Branch Vessel is small in size.  Left Posterior Atrioventricular Artery Vessel is small in size.  Right Coronary Artery Prox RCA-1 lesion is 30% stenosed. The lesion is focal. Prox RCA-2 lesion is 10% stenosed. The lesion was previously treated using a drug eluting stent over 2 years ago. Previously placed stent displays restenosis. Prox RCA to Mid RCA lesion is 30% stenosed. The lesion is focal and concentric. Mid  RCA to Dist RCA lesion is 20% stenosed. The lesion was previously treated.  Acute Marginal Branch Vessel is small in size.  Right Ventricular Branch Vessel is small in size.  Right Posterior Atrioventricular Artery Vessel is small in size.  Intervention  No interventions have been documented.   CARDIAC  CATHETERIZATION  CARDIAC CATHETERIZATION 03/30/2020  Narrative  Prox RCA-1 lesion is 30% stenosed.  Prox RCA-2 lesion is 10% stenosed.  Prox RCA to Mid RCA lesion is 30% stenosed.  Mid RCA lesion is 10% stenosed.  Dist RCA lesion is 30% stenosed.  RPAV lesion is 30% stenosed.  Mid Cx lesion is 60% stenosed.  Mid LAD-1 lesion is 50% stenosed.  Previously placed Mid LAD-2 stent (unknown type) is widely patent.  1. Patent stent mid LAD without restenosis. Moderate mid LAD stenosis prior to the old stent, unchanged from last cath. 2. Moderate mid Circumflex stenosis. DFR of this lesion was 0.99 suggesting the stenosis is not significant 3. The RCA has overlapping mid stents and a distal stent, all are patent without restenosis.  Recommendations: Continue medical management of CAD  Findings Coronary Findings Diagnostic  Dominance: Right  Left Anterior Descending Vessel is large. Mid LAD-1 lesion is 50% stenosed. Previously placed Mid LAD-2 stent (unknown type) is widely patent.  Left Circumflex Vessel is large. Mid Cx lesion is 60% stenosed.  Right Coronary Artery Vessel is large. Prox RCA-1 lesion is 30% stenosed. Prox RCA-2 lesion is 10% stenosed. The lesion was previously treated using a stent (unknown type) over 2 years ago. Prox RCA to Mid RCA lesion is 30% stenosed. Mid RCA lesion is 10% stenosed. The lesion was previously treated using a stent (unknown type) over 2 years ago. Dist RCA lesion is 30% stenosed.  Right Posterior Atrioventricular Artery RPAV lesion is 30% stenosed.  Intervention  No interventions have been documented.   STRESS TESTS  MYOCARDIAL PERFUSION IMAGING 03/11/2022  Narrative   The study is normal. The study is low risk.   Left ventricular function is abnormal. Nuclear stress EF: 48 %. The left ventricular ejection fraction is mildly decreased (45-54%). End diastolic cavity size is normal.   Prior study available for comparison from  03/11/2018.  ECHOCARDIOGRAM  ECHOCARDIOGRAM COMPLETE 03/26/2022  Narrative ECHOCARDIOGRAM REPORT    Patient Name:   MAE BEAUCHAMP Date of Exam: 03/26/2022 Medical Rec #:  [id number]      Height:       66.0 in Accession #:    [id number]     Weight:       178.0 lb Date of Birth:  Dec 20, 1960       BSA:          1.903 m Patient Age:    61 years       BP:           120/76 mmHg Patient Gender: M              HR:           46 bpm. Exam Location:  [place]  Procedure: 2D Echo, 3D Echo, Cardiac Doppler, Color Doppler and Strain Analysis  Indications:    Coronary artery disease involving native coronary artery of native heart without angina pectoris [I25.10 (ICD-10-CM)]  History:        Patient has prior history of Echocardiogram examinations, most recent 07/14/2020. CAD, PAD; Risk Factors:Hypertension.  Sonographer:    Margreta Journey RDCS Referring Phys: Rito Ehrlich [facility]  IMPRESSIONS   1. Left ventricular ejection fraction,  by estimation, is 55 to 60%. The left ventricle has normal function. The left ventricle has no regional wall motion abnormalities. There is mild concentric left ventricular hypertrophy. Left ventricular diastolic parameters are consistent with Grade I diastolic dysfunction (impaired relaxation). 2. Right ventricular systolic function is normal. The right ventricular size is normal. 3. The mitral valve is normal in structure. No evidence of mitral valve regurgitation. No evidence of mitral stenosis. 4. The aortic valve is tricuspid. Aortic valve regurgitation is not visualized. No aortic stenosis is present. 5. Aortic Normal DTA. 6. The inferior vena cava is normal in size with greater than 50% respiratory variability, suggesting right atrial pressure of 3 mmHg.  FINDINGS Left Ventricle: Left ventricular ejection fraction, by estimation, is 55 to 60%. The left ventricle has normal function. The left ventricle has no regional wall motion abnormalities. The left  ventricular internal cavity size was normal in size. There is mild concentric left ventricular hypertrophy. Left ventricular diastolic parameters are consistent with Grade I diastolic dysfunction (impaired relaxation). Indeterminate filling pressures.  Right Ventricle: The right ventricular size is normal. No increase in right ventricular wall thickness. Right ventricular systolic function is normal.  Left Atrium: Left atrial size was normal in size.  Right Atrium: Right atrial size was normal in size.  Pericardium: There is no evidence of pericardial effusion.  Mitral Valve: The mitral valve is normal in structure. No evidence of mitral valve regurgitation. No evidence of mitral valve stenosis.  Tricuspid Valve: The tricuspid valve is normal in structure. Tricuspid valve regurgitation is not demonstrated. No evidence of tricuspid stenosis.  Aortic Valve: The aortic valve is tricuspid. Aortic valve regurgitation is not visualized. No aortic stenosis is present.  Pulmonic Valve: The pulmonic valve was normal in structure. Pulmonic valve regurgitation is not visualized. No evidence of pulmonic stenosis.  Aorta: The aortic root and ascending aorta are structurally normal, with no evidence of dilitation, the aortic arch was not well visualized and Normal DTA.  Venous: A normal flow pattern is recorded from the right upper pulmonary vein. The inferior vena cava is normal in size with greater than 50% respiratory variability, suggesting right atrial pressure of 3 mmHg.  IAS/Shunts: No atrial level shunt detected by color flow Doppler.   LEFT VENTRICLE PLAX 2D LVIDd:         5.00 cm   Diastology LVIDs:         3.70 cm   LV e' medial:    5.66 cm/s LV PW:         1.10 cm   LV E/e' medial:  12.6 LV IVS:        1.40 cm   LV e' lateral:   8.49 cm/s LVOT diam:     2.10 cm   LV E/e' lateral: 8.4 LV SV:         76 LV SV Index:   40        2D Longitudinal Strain LVOT Area:     3.46 cm  2D Strain  GLS Avg:     -14.5 %  3D Volume EF: 3D EF:        52 % LV EDV:       182 ml LV ESV:       86 ml LV SV:        95 ml  RIGHT VENTRICLE             IVC RV Basal diam:  2.60 cm     IVC diam:  1.10 cm RV S prime:     12.60 cm/s TAPSE (M-mode): 2.7 cm  LEFT ATRIUM             Index        RIGHT ATRIUM           Index LA diam:        4.00 cm 2.10 cm/m   RA Area:     17.50 cm LA Vol (A2C):   52.0 ml 27.30 ml/m  RA Volume:   45.00 ml  23.64 ml/m LA Vol (A4C):   52.2 ml 27.43 ml/m LA Biplane Vol: 55.7 ml 29.27 ml/m AORTIC VALVE LVOT Vmax:   99.80 cm/s LVOT Vmean:  73.400 cm/s LVOT VTI:    0.219 m  AORTA Ao Root diam: 3.10 cm Ao Asc diam:  3.60 cm Ao Desc diam: 2.20 cm  MITRAL VALVE MV Area (PHT): 2.77 cm    SHUNTS MV Decel Time: 274 msec    Systemic VTI:  0.22 m MV E velocity: 71.10 cm/s  Systemic Diam: 2.10 cm MV A velocity: 76.30 cm/s MV E/A ratio:  0.93  Norman Herrlich MD Electronically signed by Norman Herrlich MD Signature Date/Time: 03/26/2022/5:27:03 PM    Final   MONITORS  LONG TERM MONITOR (3-14 DAYS) 01/28/2021  Narrative The patient wore the monitor for 6 days 5 hours starting January 15, 2021. Indication: Palpitations  The minimum heart rate was 51 bpm, maximum heart rate was 150 bpm, and average heart rate was 82 bpm. Predominant underlying rhythm was Sinus Rhythm.   Premature atrial complexes were rare less than 1%. Premature Ventricular complexes were rare less than 1%.  No ventricular tachycardia, no pauses, No AV block, no supraventricular tachycardia and no atrial fibrillation present. 3 patient triggered events all associated with sinus rhythm and sinus tachycardia.  Conclusion: Normal/unremarkable study with no evidence of significant arrhythmia.            Laboratory Data:  High Sensitivity Troponin:  No results for input(s): "TROPONINIHS" in the last 720 hours.    ChemistryNo results for input(s): "NA", "K", "CL", "CO2", "GLUCOSE", "BUN",  "CREATININE", "CALCIUM", "MG", "GFRNONAA", "GFRAA", "ANIONGAP" in the last 168 hours.  No results for input(s): "PROT", "ALBUMIN", "AST", "ALT", "ALKPHOS", "BILITOT" in the last 168 hours. Lipids No results for input(s): "CHOL", "TRIG", "HDL", "LABVLDL", "LDLCALC", "CHOLHDL" in the last 168 hours. HematologyNo results for input(s): "WBC", "RBC", "HGB", "HCT", "MCV", "MCH", "MCHC", "RDW", "PLT" in the last 168 hours. Thyroid No results for input(s): "TSH", "FREET4" in the last 168 hours. BNPNo results for input(s): "BNP", "PROBNP" in the last 168 hours.  DDimer No results for input(s): "DDIMER" in the last 168 hours.   Radiology/Studies:  No results found.   Assessment and Plan:   Chest Pain  CAD  - Patient previously had 4 stents placed when he was living in [place]. Most recent catheterization from 07/2020 showed stable three-vessel CAD with patent RCA stents, stable patent LAD stent with roughly 50% lesion just proximal to the stent, stable 70% lesion in the mid Lcx - Patient presented to the ED at [facility] yesterday complaining of left sided chest pain. hsTn neg x3. CTA chest without PE, without thoracic aortic aneurysm/dissection  - He was seen by Dr. Cannon Kettle while at [place] who recommended admission to [facility] for further cardiac evaluation. Did not obtain stress test at [facility] due to ongoing chest pain  - On interview today, patient reports that he has been having episodes of diaphoresis for  2 weeks. Has had intermittent left sided chest pain for 1 week. Goes away when he takes his medications, but returns when his medications wear off. Denies exertional chest pain.  - Patient reports that he has had side effects on plavix in the past and refuses to take plavix or any similar medications. Discussed that if he needs stent placed, he will need to be on DAPT. He reported that he is willing to take an antiplatelet other than plavix if needed - Plan for cath tomorrow. Put in diet  order to ensure patient gets dinner tonight as he is frustrated he has not been able to eat  - Continue amlodipine 10 mg daily, asa 81 mg daily, carvedilol 12.5 mg BID, zetia 10 mg daily, ranexa 500 mg BID   HTN  - Continue amlodipine, carvedilol as above   HLD - Lipid panel from 03/2023 showed LDL 72, HDL 44, triglycerides 94, total cholesterol 134  - Continue lipitor 80 mg daily, zetia 10 mg daily    Risk Assessment/Risk Scores:    Code Status: Full Code  Severity of Illness: The appropriate patient status for this patient is OBSERVATION. Observation status is judged to be reasonable and necessary in order to provide the required intensity of service to ensure the patient's safety. The patient's presenting symptoms, physical exam findings, and initial radiographic and laboratory data in the context of their medical condition is felt to place them at decreased risk for further clinical deterioration. Furthermore, it is anticipated that the patient will be medically stable for discharge from the hospital within 2 midnights of admission.    For questions or updates, please contact [facility] Please consult [email or web address] for contact info under     Signed, Jonita Albee, PA-C  09/23/2023 4:20 PM

## 2023-09-24 ENCOUNTER — Encounter (HOSPITAL_COMMUNITY)
Admission: EM | Disposition: A | Discharge status: Home / Self Care | Payer: Self-pay | Source: Other Acute Inpatient Hospital | Attending: Cardiovascular Disease

## 2023-09-24 DIAGNOSIS — I2510 Atherosclerotic heart disease of native coronary artery without angina pectoris: Secondary | ICD-10-CM

## 2023-09-24 DIAGNOSIS — I200 Unstable angina: Secondary | ICD-10-CM

## 2023-09-24 DIAGNOSIS — E7800 Pure hypercholesterolemia, unspecified: Secondary | ICD-10-CM

## 2023-09-24 DIAGNOSIS — Z955 Presence of coronary angioplasty implant and graft: Secondary | ICD-10-CM | POA: Diagnosis not present

## 2023-09-24 DIAGNOSIS — N401 Enlarged prostate with lower urinary tract symptoms: Secondary | ICD-10-CM

## 2023-09-24 DIAGNOSIS — R3911 Hesitancy of micturition: Secondary | ICD-10-CM

## 2023-09-24 DIAGNOSIS — R079 Chest pain, unspecified: Secondary | ICD-10-CM | POA: Diagnosis not present

## 2023-09-24 DIAGNOSIS — Z79899 Other long term (current) drug therapy: Secondary | ICD-10-CM | POA: Diagnosis not present

## 2023-09-24 DIAGNOSIS — I10 Essential (primary) hypertension: Secondary | ICD-10-CM | POA: Diagnosis not present

## 2023-09-24 HISTORY — DX: Pure hypercholesterolemia, unspecified: E78.00

## 2023-09-24 HISTORY — PX: LEFT HEART CATH AND CORONARY ANGIOGRAPHY: CATH118249

## 2023-09-24 HISTORY — DX: Benign prostatic hyperplasia with lower urinary tract symptoms: N40.1

## 2023-09-24 HISTORY — DX: Unstable angina: I20.0

## 2023-09-24 LAB — CBC
HCT: 30.9 % — ABNORMAL LOW (ref 39.0–52.0)
HCT: 35 % — ABNORMAL LOW (ref 39.0–52.0)
Hemoglobin: 10.3 g/dL — ABNORMAL LOW (ref 13.0–17.0)
Hemoglobin: 11.7 g/dL — ABNORMAL LOW (ref 13.0–17.0)
MCH: 28.9 pg (ref 26.0–34.0)
MCH: 29.2 pg (ref 26.0–34.0)
MCHC: 33.3 g/dL (ref 30.0–36.0)
MCHC: 33.4 g/dL (ref 30.0–36.0)
MCV: 86.8 fL (ref 80.0–100.0)
MCV: 87.3 fL (ref 80.0–100.0)
Platelets: 167 10*3/uL (ref 150–400)
Platelets: 187 K/uL (ref 150–400)
RBC: 3.56 MIL/uL — ABNORMAL LOW (ref 4.22–5.81)
RBC: 4.01 MIL/uL — ABNORMAL LOW (ref 4.22–5.81)
RDW: 13 % (ref 11.5–15.5)
RDW: 13.2 % (ref 11.5–15.5)
WBC: 9.6 10*3/uL (ref 4.0–10.5)
WBC: 9.7 K/uL (ref 4.0–10.5)
nRBC: 0 % (ref 0.0–0.2)
nRBC: 0 % (ref 0.0–0.2)

## 2023-09-24 LAB — CREATININE, SERUM
Creatinine, Ser: 0.86 mg/dL (ref 0.61–1.24)
GFR, Estimated: >60 mL/min (ref >60)

## 2023-09-24 LAB — BASIC METABOLIC PANEL
Anion gap: 7 (ref 5–15)
BUN: 18 mg/dL (ref 6–20)
CO2: 27 mmol/L (ref 22–32)
Calcium: 8.7 mg/dL — ABNORMAL LOW (ref 8.9–10.3)
Chloride: 105 mmol/L (ref 98–111)
Creatinine, Ser: 1.11 mg/dL (ref 0.61–1.24)
GFR, Estimated: >60 mL/min (ref >60)
Glucose, Bld: 167 mg/dL — ABNORMAL HIGH (ref 70–99)
Potassium: 3.3 mmol/L — ABNORMAL LOW (ref 3.5–5.1)
Sodium: 139 mmol/L (ref 135–145)

## 2023-09-24 LAB — PSA: Prostatic Specific Antigen: 456.53 ng/mL — ABNORMAL HIGH (ref 0.00–4.00)

## 2023-09-24 SURGERY — LEFT HEART CATH AND CORONARY ANGIOGRAPHY
Anesthesia: LOCAL

## 2023-09-24 MED ORDER — LIDOCAINE HCL (PF) 1 % IJ SOLN
INTRAMUSCULAR | Status: DC | PRN
  Administered 2023-09-24: 5 mL

## 2023-09-24 MED ORDER — VERAPAMIL HCL 2.5 MG/ML IV SOLN
INTRAVENOUS | Status: AC
  Filled 2023-09-24: qty 2

## 2023-09-24 MED ORDER — POTASSIUM CHLORIDE 20 MEQ PO PACK
40.0000 meq | PACK | Freq: Once | ORAL | Status: AC
  Administered 2023-09-24: 40 meq via ORAL
  Filled 2023-09-24: qty 2

## 2023-09-24 MED ORDER — SODIUM CHLORIDE 0.9% FLUSH
3.0000 mL | Freq: Two times a day (BID) | INTRAVENOUS | Status: DC
  Administered 2023-09-24 – 2023-09-25 (×3): 3 mL via INTRAVENOUS

## 2023-09-24 MED ORDER — IOHEXOL 350 MG/ML SOLN
INTRAVENOUS | Status: DC | PRN
  Administered 2023-09-24: 27 mL

## 2023-09-24 MED ORDER — HEPARIN SODIUM (PORCINE) 1000 UNIT/ML IJ SOLN
INTRAMUSCULAR | Status: DC | PRN
  Administered 2023-09-24: 4000 [IU] via INTRAVENOUS

## 2023-09-24 MED ORDER — LIDOCAINE HCL (PF) 1 % IJ SOLN
INTRAMUSCULAR | Status: AC
  Filled 2023-09-24: qty 30

## 2023-09-24 MED ORDER — HEPARIN SODIUM (PORCINE) 1000 UNIT/ML IJ SOLN
INTRAMUSCULAR | Status: AC
  Filled 2023-09-24: qty 10

## 2023-09-24 MED ORDER — SODIUM CHLORIDE 0.9% FLUSH: 3.0000 mL | INTRAVENOUS | Status: DC | PRN

## 2023-09-24 MED ORDER — ENOXAPARIN SODIUM 40 MG/0.4ML IJ SOSY
40.0000 mg | PREFILLED_SYRINGE | INTRAMUSCULAR | Status: DC
  Administered 2023-09-25: 40 mg via SUBCUTANEOUS
  Filled 2023-09-24: qty 0.4

## 2023-09-24 MED ORDER — VERAPAMIL HCL 2.5 MG/ML IV SOLN
INTRAVENOUS | Status: DC | PRN
  Administered 2023-09-24: 10 mL via INTRA_ARTERIAL

## 2023-09-24 MED ORDER — SODIUM CHLORIDE 0.9 % IV SOLN: 250.0000 mL | INTRAVENOUS | Status: AC | PRN

## 2023-09-24 MED ORDER — HEPARIN (PORCINE) IN NACL 1000-0.9 UT/500ML-% IV SOLN
INTRAVENOUS | Status: DC | PRN
  Administered 2023-09-24 (×2): 500 mL

## 2023-09-24 SURGICAL SUPPLY — 8 items
CATH 5FR JL3.5 JR4 ANG PIG MP (CATHETERS) IMPLANT
DEVICE RAD COMP TR BAND LRG (VASCULAR PRODUCTS) IMPLANT
GLIDESHEATH SLEND SS 6F .021 (SHEATH) IMPLANT
GUIDEWIRE INQWIRE 1.5J.035X260 (WIRE) IMPLANT
INQWIRE 1.5J .035X260CM (WIRE) ×1
PACK CARDIAC CATHETERIZATION (CUSTOM PROCEDURE TRAY) ×1 IMPLANT
SET ATX-X65L (MISCELLANEOUS) IMPLANT
SHEATH PROBE COVER 6X72 (BAG) IMPLANT

## 2023-09-24 NOTE — Interval H&P Note (Signed)
History and Physical Interval Note:  09/24/2023 1:35 PM  Cody [name]  has presented today for surgery, with the diagnosis of unstable angina.  The various methods of treatment have been discussed with the patient and family. After consideration of risks, benefits and other options for treatment, the patient has consented to  Procedure(s): LEFT HEART CATH AND CORONARY ANGIOGRAPHY (N/A) as a surgical intervention.  The patient's history has been reviewed, patient examined, no change in status, stable for surgery.  I have reviewed the patient's chart and labs.  Questions were answered to the patient's satisfaction.   Cath Lab Visit (complete for each Cath Lab visit)  Clinical Evaluation Leading to the Procedure:   ACS: Yes.    Non-ACS:    Anginal Classification: CCS III  Anti-ischemic medical therapy: Maximal Therapy (2 or more classes of medications)  Non-Invasive Test Results: No non-invasive testing performed  Prior CABG: No previous CABG        Theron Arista [facility] 09/24/2023 1:35 PM

## 2023-09-24 NOTE — H&P (View-Only) (Signed)
Patient Name: Cody [name] Date of Encounter: 09/24/2023 [facility] Cardiologist: None   Interval Summary  .    Feeling well.  No chest pain.  He was told at OSH that he has an enlarged prostate.  He is now concerned that he may have prostate cancer and wants that evaluated in the hospital.    Vital Signs .    Vitals:   09/23/23 1932 09/23/23 2356 09/24/23 0449 09/24/23 0751  BP: 121/75 138/81 (!) 131/90 119/83  Pulse: 73 70 73 80  Resp: 18 18 18 18   Temp: 98 F (36.7 C) 98.1 F (36.7 C) 98.1 F (36.7 C) 98.4 F (36.9 C)  TempSrc: Oral Oral Oral Oral  SpO2: 97% 97% 98% 97%  Weight:   78.8 kg   Height:        Intake/Output Summary (Last 24 hours) at 09/24/2023 1106 Last data filed at 09/24/2023 0000 Gross per 24 hour  Intake 480 ml  Output --  Net 480 ml      09/24/2023    4:49 AM 09/23/2023    3:55 PM 03/24/2023   11:11 AM  Last 3 Weights  Weight (lbs) 173 lb 11.2 oz 171 lb 177 lb 3.2 oz  Weight (kg) 78.79 kg 77.565 kg 80.377 kg      Telemetry/ECG    Sinus rhythm.  5 beats NSVT.  - Personally Reviewed  Physical Exam .    VS:  BP 119/83 (BP Location: Left Arm)   Pulse 80   Temp 98.4 F (36.9 C) (Oral)   Resp 18   Ht 5\' 5"  (1.651 m)   Wt 78.8 kg   SpO2 97%   BMI 28.91 kg/m  , BMI Body mass index is 28.91 kg/m. GENERAL:  Well appearing HEENT: Pupils equal round and reactive, fundi not visualized, oral mucosa unremarkable NECK:  No jugular venous distention, waveform within normal limits, carotid upstroke brisk and symmetric, no bruits, no thyromegaly LUNGS:  Clear to auscultation bilaterally HEART:  RRR.  PMI not displaced or sustained,S1 and S2 within normal limits, no S3, no S4, no clicks, no rubs, no murmurs ABD:  Flat, positive bowel sounds normal in frequency in pitch, no bruits, no rebound, no guarding, no midline pulsatile mass, no hepatomegaly, no splenomegaly EXT:  2 plus pulses throughout, no edema, no cyanosis no  clubbing SKIN:  No rashes no nodules NEURO:  Cranial nerves II through XII grossly intact, motor grossly intact throughout PSYCH:  Cognitively intact, oriented to person place and time   Assessment & Plan .     Cody [name] is a 56M with CAD s/p PCI, hypertension, PAD, GERD and prior cocaine use admitted with chest pain.     # Unstable angina:  He has a history of 4 prior stents with most recent cath in 2021 revealing moderate disease in the LAD and prox LCX that was not significant by FFR.  Medical management was recommended.  Of noted, he reports that clopidogrel caused blindness.  Currently he reports several days of intermittent chest pain both at rest and with exertion as well as diaphoresis. Cardiac enzymes are negative and EKG is without acute ischemic changes.  At this time he is chest pain free.  He reports prior negative stress testing and doesn't believe in them.  Will plan for [facility] tomorrow.  Recommend ticagrelor if DAPT is needed.  Continue aspirin, atorvastatin and metoprolol.  Of note, patient now reports that he was told at the OSH that he  has an enlarged prostate due to his report of issues with initiating urination and straining with bowel movements.  They recommended outpatient urology referral and provided him with a name and contact information.   He is now afraid that he may have prostate cancer, though he hasn't had a recent PSA and there is no objective reason to be concerned about this.  We discussed that this is not an inpatient evaluation but I am happy to order a PSA.  He then also requested that we do his routine colonoscopy inpatient because "I have good insurance" and in [place] they would do everything I asked for while in the hospital.  On prior admissions he has undergone cardiac catheterization and then requested multiple additional inpatient evaluations of chronic concerns.  I do not have a high clinical suspicion that he will need prostate biopsy and recommend proceeding  with cardiac catheterization as scheduled.    Informed Consent   Shared Decision Making/Informed Consent The risks [stroke (1 in 1000), death (1 in 1000), kidney failure [usually temporary] (1 in 500), bleeding (1 in 200), allergic reaction [possibly serious] (1 in 200)], benefits (diagnostic support and management of coronary artery disease) and alternatives of a cardiac catheterization were discussed in detail with Cody [name] and he is willing to proceed.    # Hyperlipidemia:  Continue atorvastatin and Zetia.    # Hypertension:  Continue amlodipine and carvedilol.    For questions or updates, please contact [facility] Please consult [email or web address] for contact info under        Signed, Chilton Si, MD

## 2023-09-24 NOTE — Progress Notes (Addendum)
Patient Name: Cody [name] Date of Encounter: 09/24/2023 [facility] Cardiologist: None   Interval Summary  .    Feeling well.  No chest pain.  He was told at OSH that he has an enlarged prostate.  He is now concerned that he may have prostate cancer and wants that evaluated in the hospital.    Vital Signs .    Vitals:   09/23/23 1932 09/23/23 2356 09/24/23 0449 09/24/23 0751  BP: 121/75 138/81 (!) 131/90 119/83  Pulse: 73 70 73 80  Resp: 18 18 18 18   Temp: 98 F (36.7 C) 98.1 F (36.7 C) 98.1 F (36.7 C) 98.4 F (36.9 C)  TempSrc: Oral Oral Oral Oral  SpO2: 97% 97% 98% 97%  Weight:   78.8 kg   Height:        Intake/Output Summary (Last 24 hours) at 09/24/2023 1106 Last data filed at 09/24/2023 0000 Gross per 24 hour  Intake 480 ml  Output --  Net 480 ml      09/24/2023    4:49 AM 09/23/2023    3:55 PM 03/24/2023   11:11 AM  Last 3 Weights  Weight (lbs) 173 lb 11.2 oz 171 lb 177 lb 3.2 oz  Weight (kg) 78.79 kg 77.565 kg 80.377 kg      Telemetry/ECG    Sinus rhythm.  5 beats NSVT.  - Personally Reviewed  Physical Exam .    VS:  BP 119/83 (BP Location: Left Arm)   Pulse 80   Temp 98.4 F (36.9 C) (Oral)   Resp 18   Ht 5\' 5"  (1.651 m)   Wt 78.8 kg   SpO2 97%   BMI 28.91 kg/m  , BMI Body mass index is 28.91 kg/m. GENERAL:  Well appearing HEENT: Pupils equal round and reactive, fundi not visualized, oral mucosa unremarkable NECK:  No jugular venous distention, waveform within normal limits, carotid upstroke brisk and symmetric, no bruits, no thyromegaly LUNGS:  Clear to auscultation bilaterally HEART:  RRR.  PMI not displaced or sustained,S1 and S2 within normal limits, no S3, no S4, no clicks, no rubs, no murmurs ABD:  Flat, positive bowel sounds normal in frequency in pitch, no bruits, no rebound, no guarding, no midline pulsatile mass, no hepatomegaly, no splenomegaly EXT:  2 plus pulses throughout, no edema, no cyanosis no  clubbing SKIN:  No rashes no nodules NEURO:  Cranial nerves II through XII grossly intact, motor grossly intact throughout PSYCH:  Cognitively intact, oriented to person place and time   Assessment & Plan .     Cody [name] is a 51M with CAD s/p PCI, hypertension, PAD, GERD and prior cocaine use admitted with chest pain.     # Unstable angina:  He has a history of 4 prior stents with most recent cath in 2021 revealing moderate disease in the LAD and prox LCX that was not significant by FFR.  Medical management was recommended.  Of noted, he reports that clopidogrel caused blindness.  Currently he reports several days of intermittent chest pain both at rest and with exertion as well as diaphoresis. Cardiac enzymes are negative and EKG is without acute ischemic changes.  At this time he is chest pain free.  He reports prior negative stress testing and doesn't believe in them.  Will plan for [facility] tomorrow.  Recommend ticagrelor if DAPT is needed.  Continue aspirin, atorvastatin and metoprolol.  Of note, patient now reports that he was told at the OSH that he  has an enlarged prostate due to his report of issues with initiating urination and straining with bowel movements.  They recommended outpatient urology referral and provided him with a name and contact information.   He is now afraid that he may have prostate cancer, though he hasn't had a recent PSA and there is no objective reason to be concerned about this.  We discussed that this is not an inpatient evaluation but I am happy to order a PSA.  He then also requested that we do his routine colonoscopy inpatient because "I have good insurance" and in [place] they would do everything I asked for while in the hospital.  On prior admissions he has undergone cardiac catheterization and then requested multiple additional inpatient evaluations of chronic concerns.     Informed Consent   Shared Decision Making/Informed Consent The risks [stroke (1 in 1000),  death (1 in 1000), kidney failure [usually temporary] (1 in 500), bleeding (1 in 200), allergic reaction [possibly serious] (1 in 200)], benefits (diagnostic support and management of coronary artery disease) and alternatives of a cardiac catheterization were discussed in detail with Cody [name] and he is willing to proceed.    # Hyperlipidemia:  Continue atorvastatin and Zetia.    # Hypertension:  Continue amlodipine and carvedilol.    For questions or updates, please contact [facility] Please consult [email or web address] for contact info under        Signed, Chilton Si, MD

## 2023-09-24 NOTE — TOC Initial Note (Addendum)
Transition of Care [facility]) - Initial/Assessment Note    Patient Details  Name: Cody [name] MRN: [id number] Date of Birth: 1961-09-26  Transition of Care [facility]) CM/SW Contact:    Leone Haven, RN Phone Number: 09/24/2023, 4:12 PM  Clinical Narrative:                 From home with spouse, has PCP and insurance on file, states has no HH services in place at this time, has rollator at home. States he will need ast with transport at dc his wife does not drive  and she  is support system, states [facility] [place].  Pta self ambulatory.   NCM scheduled safe transport for 2pm tomorrow in case he is discharged.  If not dc will need to cancel the safe transport ride [phone].    Expected Discharge Plan: Home/Self Care Barriers to Discharge: Transportation   Patient Goals and CMS Choice Patient states their goals for this hospitalization and ongoing recovery are:: return home          Expected Discharge Plan and Services In-house Referral: NA Discharge Planning Services: CM Consult Post Acute Care Choice: NA Living arrangements for the past 2 months: Single Family Home                 DME Arranged: N/A DME Agency: NA       HH Arranged: NA          Prior Living Arrangements/Services Living arrangements for the past 2 months: Single Family Home Lives with:: Spouse Patient language and need for interpreter reviewed:: Yes Do you feel safe going back to the place where you live?: Yes      Need for Family Participation in Patient Care: No (Comment) Care giver support system in place?: Yes (comment) Current home services: DME (rollator) Criminal Activity/Legal Involvement Pertinent to Current Situation/Hospitalization: No - Comment as needed  Activities of Daily Living   ADL Screening (condition at time of admission) Independently performs ADLs?: Yes (appropriate for developmental age) Is the patient deaf or have difficulty hearing?: No Does  the patient have difficulty seeing, even when wearing glasses/contacts?: No Does the patient have difficulty concentrating, remembering, or making decisions?: No  Permission Sought/Granted Permission sought to share information with : Case Manager Permission granted to share information with : Yes, Verbal Permission Granted              Emotional Assessment Appearance:: Appears stated age Attitude/Demeanor/Rapport: Engaged Affect (typically observed): Accepting Orientation: : Oriented to Self, Oriented to Place, Oriented to  Time, Oriented to Situation   Psych Involvement: No (comment)  Admission diagnosis:  Chest pain [R07.9] Patient Active Problem List   Diagnosis Date Noted   Unstable angina (HCC) 09/24/2023   Pure hypercholesterolemia 09/24/2023   Benign prostatic hyperplasia with urinary hesitancy 09/24/2023   GERD (gastroesophageal reflux disease)    Coronary artery disease    Hiatal hernia    Ambulatory dysfunction 07/17/2020   CAD (coronary artery disease) 05/30/2020   Sciatica of right side 05/30/2020   Hypokalemia    CAP (community acquired pneumonia) 03/31/2020   Groin pain, chronic, right    Leukocytosis    PUD (peptic ulcer disease)    Chronic chest pain with high risk for CAD    Positive cardiac stress test 03/29/2020   Chest pain 09/24/2012    Class: Acute   Retinal vein occlusion 09/24/2012   Constipation 09/22/2012   Visual field defect 09/21/2012  HTN (hypertension) 09/20/2012   Smoker 09/20/2012   PAD (peripheral artery disease) (HCC) 09/20/2012   Cocaine abuse (HCC) 09/20/2012   PCP:  Patient, No Pcp Per Pharmacy:   [id number] [place], [place] - [address] AT [facility] [address] [place] [address] Phone: [phone] Fax: [phone]     Social Drivers of Health (SDOH) Social History: SDOH Screenings   Food Insecurity: No Food Insecurity (09/23/2023)  Housing: Low Risk  (09/23/2023)   Transportation Needs: No Transportation Needs (09/23/2023)  Utilities: Not At Risk (09/23/2023)  Tobacco Use: High Risk (03/24/2023)   SDOH Interventions:     Readmission Risk Interventions     No data to display

## 2023-09-24 NOTE — Care Management Obs Status (Signed)
MEDICARE OBSERVATION STATUS NOTIFICATION   Patient Details  Name: Cody [name] MRN: [id number] Date of Birth: [birth year]   Medicare Observation Status Notification Given:  Yes    Leone Haven, RN 09/24/2023, 4:23 PM

## 2023-09-24 NOTE — Plan of Care (Signed)
   Problem: Education: Goal: Knowledge of General Education information will improve Description: Including pain rating scale, medication(s)/side effects and non-pharmacologic comfort measures Outcome: Progressing   Problem: Health Behavior/Discharge Planning: Goal: Ability to manage health-related needs will improve Outcome: Progressing   Problem: Clinical Measurements: Goal: Ability to maintain clinical measurements within normal limits will improve Outcome: Progressing Goal: Will remain free from infection Outcome: Progressing Goal: Diagnostic test results will improve Outcome: Progressing Goal: Respiratory complications will improve Outcome: Progressing Goal: Cardiovascular complication will be avoided Outcome: Progressing   Problem: Activity: Goal: Risk for activity intolerance will decrease Outcome: Progressing   Problem: Nutrition: Goal: Adequate nutrition will be maintained Outcome: Progressing   Problem: Coping: Goal: Level of anxiety will decrease Outcome: Progressing   Problem: Elimination: Goal: Will not experience complications related to bowel motility Outcome: Progressing Goal: Will not experience complications related to urinary retention Outcome: Progressing   Problem: Pain Management: Goal: General experience of comfort will improve Outcome: Progressing   Problem: Safety: Goal: Ability to remain free from injury will improve Outcome: Progressing   Problem: Skin Integrity: Goal: Risk for impaired skin integrity will decrease Outcome: Progressing   Problem: Education: Goal: Understanding of cardiac disease, CV risk reduction, and recovery process will improve Outcome: Progressing Goal: Individualized Educational Video(s) Outcome: Progressing   Problem: Activity: Goal: Ability to tolerate increased activity will improve Outcome: Progressing   Problem: Cardiac: Goal: Ability to achieve and maintain adequate cardiovascular perfusion will  improve Outcome: Progressing   Problem: Health Behavior/Discharge Planning: Goal: Ability to safely manage health-related needs after discharge will improve Outcome: Progressing   Problem: Education: Goal: Understanding of CV disease, CV risk reduction, and recovery process will improve Outcome: Progressing Goal: Individualized Educational Video(s) Outcome: Progressing   Problem: Activity: Goal: Ability to return to baseline activity level will improve Outcome: Progressing   Problem: Cardiovascular: Goal: Ability to achieve and maintain adequate cardiovascular perfusion will improve Outcome: Progressing Goal: Vascular access site(s) Level 0-1 will be maintained Outcome: Progressing   Problem: Health Behavior/Discharge Planning: Goal: Ability to safely manage health-related needs after discharge will improve Outcome: Progressing

## 2023-09-24 NOTE — Plan of Care (Signed)
  Problem: Education: Goal: Knowledge of General Education information will improve Description Including pain rating scale, medication(s)/side effects and non-pharmacologic comfort measures Outcome: Progressing   Problem: Activity: Goal: Risk for activity intolerance will decrease Outcome: Progressing   Problem: Safety: Goal: Ability to remain free from injury will improve Outcome: Progressing

## 2023-09-24 NOTE — Progress Notes (Signed)
Patient reporting urinary hesitancy, straining with bowel movements and has abnormal PSA 456  Attempted to call urology at [phone] Dr. Margo Aye.  No response left voicemail.  Was given personal phone number to call back.  If no further updates please attempt to call back again.

## 2023-09-25 ENCOUNTER — Encounter (HOSPITAL_COMMUNITY): Payer: Self-pay | Admitting: Cardiology

## 2023-09-25 ENCOUNTER — Other Ambulatory Visit (HOSPITAL_COMMUNITY): Payer: Self-pay

## 2023-09-25 DIAGNOSIS — I200 Unstable angina: Secondary | ICD-10-CM | POA: Diagnosis not present

## 2023-09-25 DIAGNOSIS — R079 Chest pain, unspecified: Secondary | ICD-10-CM | POA: Diagnosis not present

## 2023-09-25 LAB — TSH: TSH: 1.489 u[IU]/mL (ref 0.350–4.500)

## 2023-09-25 LAB — SARS CORONAVIRUS 2 BY RT PCR: SARS Coronavirus 2 by RT PCR: NEGATIVE

## 2023-09-25 LAB — LIPOPROTEIN A (LPA): Lipoprotein (a): 48.8 nmol/L — ABNORMAL HIGH (ref <75.0)

## 2023-09-25 MED ORDER — POTASSIUM CHLORIDE 20 MEQ PO PACK: 40.0000 meq | PACK | Freq: Once | ORAL | Status: DC

## 2023-09-25 MED ORDER — FINASTERIDE 5 MG PO TABS
5.0000 mg | ORAL_TABLET | Freq: Every day | ORAL | Status: DC
  Administered 2023-09-25: 5 mg via ORAL
  Filled 2023-09-25: qty 1

## 2023-09-25 MED ORDER — ISOSORBIDE MONONITRATE ER 30 MG PO TB24
30.0000 mg | ORAL_TABLET | Freq: Every day | ORAL | Status: DC
  Administered 2023-09-25: 30 mg via ORAL
  Filled 2023-09-25: qty 1

## 2023-09-25 MED ORDER — FINASTERIDE 5 MG PO TABS
5.0000 mg | ORAL_TABLET | Freq: Every day | ORAL | 11 refills | Status: AC
  Filled 2023-09-25: qty 30, 30d supply, fill #0

## 2023-09-25 MED ORDER — ISOSORBIDE MONONITRATE ER 30 MG PO TB24
30.0000 mg | ORAL_TABLET | Freq: Every day | ORAL | 3 refills | Status: DC
  Filled 2023-09-25: qty 90, 90d supply, fill #0

## 2023-09-25 MED ORDER — POTASSIUM CHLORIDE 20 MEQ PO PACK: 40.0000 meq | PACK | Freq: Two times a day (BID) | ORAL | Status: DC

## 2023-09-25 NOTE — TOC Transition Note (Addendum)
Transition of Care [facility]) - Discharge Note   Patient Details  Name: Cody [name] MRN: [id number] Date of Birth: 25-Apr-1961  Transition of Care [facility]) CM/SW Contact:  Leone Haven, RN Phone Number: 09/25/2023, 2:24 PM   Clinical Narrative:    For dc today, safe transport to transport him home today.  TOC to fill meds.   Final next level of care: Home/Self Care Barriers to Discharge: Transportation   Patient Goals and CMS Choice Patient states their goals for this hospitalization and ongoing recovery are:: return home          Discharge Placement                       Discharge Plan and Services Additional resources added to the After Visit Summary for   In-house Referral: NA Discharge Planning Services: CM Consult Post Acute Care Choice: NA          DME Arranged: N/A DME Agency: NA       HH Arranged: NA          Social Drivers of Health (SDOH) Interventions SDOH Screenings   Food Insecurity: No Food Insecurity (09/23/2023)  Housing: Low Risk  (09/23/2023)  Transportation Needs: No Transportation Needs (09/23/2023)  Utilities: Not At Risk (09/23/2023)  Tobacco Use: High Risk (03/24/2023)     Readmission Risk Interventions     No data to display

## 2023-09-25 NOTE — Progress Notes (Signed)
Patient Name: Cody [name] Date of Encounter: 09/25/2023 [facility] Cardiologist: None   Interval Summary  .    Reports R sided chest pain that improved with nitroglycerin.  Concerned he may have COVID-19 because of hot/cold intolerance and poor appetite.  No respiratory symptoms.     Vital Signs .    Vitals:   09/25/23 0001 09/25/23 0447 09/25/23 0451 09/25/23 0734  BP: 139/78 (!) 141/95  (!) 134/95  Pulse: 86   96  Resp: 18 18 18 20   Temp: 98.9 F (37.2 C) 98.8 F (37.1 C)  99.7 F (37.6 C)  TempSrc: Oral Oral  Oral  SpO2: 100% 95%  98%  Weight:   76.8 kg   Height:        Intake/Output Summary (Last 24 hours) at 09/25/2023 1000 Last data filed at 09/24/2023 1955 Gross per 24 hour  Intake --  Output 1 ml  Net -1 ml      09/25/2023    4:51 AM 09/24/2023    4:49 AM 09/23/2023    3:55 PM  Last 3 Weights  Weight (lbs) 169 lb 6.4 oz 173 lb 11.2 oz 171 lb  Weight (kg) 76.839 kg 78.79 kg 77.565 kg      Telemetry/ECG    Sinus rhythm.  5 beats NSVT.  - Personally Reviewed  Physical Exam .    VS:  BP (!) 134/95 (BP Location: Left Arm)   Pulse 96   Temp 99.7 F (37.6 C) (Oral)   Resp 20   Ht 5\' 5"  (1.651 m)   Wt 76.8 kg   SpO2 98%   BMI 28.19 kg/m  , BMI Body mass index is 28.19 kg/m. GENERAL:  Well appearing HEENT: Pupils equal round and reactive, fundi not visualized, oral mucosa unremarkable NECK:  No jugular venous distention, waveform within normal limits, carotid upstroke brisk and symmetric, no bruits, no thyromegaly LUNGS:  Clear to auscultation bilaterally HEART:  RRR.  PMI not displaced or sustained,S1 and S2 within normal limits, no S3, no S4, no clicks, no rubs, no murmurs ABD:  Flat, positive bowel sounds normal in frequency in pitch, no bruits, no rebound, no guarding, no midline pulsatile mass, no hepatomegaly, no splenomegaly EXT:  2 plus pulses throughout, no edema, no cyanosis no clubbing SKIN:  No rashes no nodules NEURO:   Cranial nerves II through XII grossly intact, motor grossly intact throughout PSYCH:  Cognitively intact, oriented to person place and time   Assessment & Plan .     Cody [name] is a 41M with CAD s/p PCI, hypertension, PAD, GERD and prior cocaine use admitted with chest pain.     # Unstable angina:  He has a history of 4 prior stents with most recent cath in 2021 revealing moderate disease in the LAD and prox LCX that was not significant by FFR.  Medical management was recommended.  He underwent repeat cardiac catheterization yesterday which was unchanged from prior.  Lipids are well-controlled.  Continue aspirin, atorvastatin, carvedilol, Zetia, and Ranexa.  Will add Imdur 30 mg daily.    # Hyperlipidemia:  Continue atorvastatin and Zetia.    # Hypertension:  Continue amlodipine and carvedilol.   # Likely prostate cancer: Patient has an enlarged prostate.  He was told that he likely had prostate cancer.  There were no records of any PSA testing.  PSA was checked here and was elevated to 456.  We did discuss with our urology team and they recommended outpatient evaluation  urgently.  He would not like to follow-up in [place].  He already has the number for a provider in [place].  Started finasteride for prostate hypertrophy and obstructive symptoms.   # Dispo: Transportation home arranged for 2pm today.   For questions or updates, please contact [facility] Please consult [email or web address] for contact info under        Signed, Chilton Si, MD

## 2023-09-25 NOTE — Plan of Care (Signed)
  Problem: Clinical Measurements: Goal: Ability to maintain clinical measurements within normal limits will improve Outcome: Progressing   Problem: Clinical Measurements: Goal: Will remain free from infection Outcome: Progressing   

## 2023-09-25 NOTE — Plan of Care (Signed)
   Problem: Education: Goal: Knowledge of General Education information will improve Description: Including pain rating scale, medication(s)/side effects and non-pharmacologic comfort measures Outcome: Progressing   Problem: Health Behavior/Discharge Planning: Goal: Ability to manage health-related needs will improve Outcome: Progressing   Problem: Clinical Measurements: Goal: Ability to maintain clinical measurements within normal limits will improve Outcome: Progressing Goal: Will remain free from infection Outcome: Progressing Goal: Diagnostic test results will improve Outcome: Progressing Goal: Respiratory complications will improve Outcome: Progressing Goal: Cardiovascular complication will be avoided Outcome: Progressing   Problem: Activity: Goal: Risk for activity intolerance will decrease Outcome: Progressing   Problem: Nutrition: Goal: Adequate nutrition will be maintained Outcome: Progressing   Problem: Coping: Goal: Level of anxiety will decrease Outcome: Progressing   Problem: Elimination: Goal: Will not experience complications related to bowel motility Outcome: Progressing Goal: Will not experience complications related to urinary retention Outcome: Progressing   Problem: Pain Management: Goal: General experience of comfort will improve Outcome: Progressing   Problem: Safety: Goal: Ability to remain free from injury will improve Outcome: Progressing   Problem: Skin Integrity: Goal: Risk for impaired skin integrity will decrease Outcome: Progressing   Problem: Education: Goal: Understanding of cardiac disease, CV risk reduction, and recovery process will improve Outcome: Progressing Goal: Individualized Educational Video(s) Outcome: Progressing   Problem: Activity: Goal: Ability to tolerate increased activity will improve Outcome: Progressing   Problem: Cardiac: Goal: Ability to achieve and maintain adequate cardiovascular perfusion will  improve Outcome: Progressing   Problem: Health Behavior/Discharge Planning: Goal: Ability to safely manage health-related needs after discharge will improve Outcome: Progressing   Problem: Education: Goal: Understanding of CV disease, CV risk reduction, and recovery process will improve Outcome: Progressing Goal: Individualized Educational Video(s) Outcome: Progressing   Problem: Activity: Goal: Ability to return to baseline activity level will improve Outcome: Progressing   Problem: Cardiovascular: Goal: Ability to achieve and maintain adequate cardiovascular perfusion will improve Outcome: Progressing Goal: Vascular access site(s) Level 0-1 will be maintained Outcome: Progressing   Problem: Health Behavior/Discharge Planning: Goal: Ability to safely manage health-related needs after discharge will improve Outcome: Progressing

## 2023-09-25 NOTE — Progress Notes (Signed)
 Patient discharged via wheelchair home in stable condition.

## 2023-09-25 NOTE — Discharge Summary (Signed)
Discharge Summary    Patient ID: Cody [name] MRN: [id number]; DOB: 1961/09/12  Admit date: 09/23/2023 Discharge date: 09/25/2023  PCP:  Patient, No Pcp Per   [facility] Providers Cardiologist:  Garwin Brothers, MD     Discharge Diagnoses    Principal Problem:   Chest pain Active Problems:   Unstable angina [facility])   Pure hypercholesterolemia   Benign prostatic hyperplasia with urinary hesitancy    Diagnostic Studies/Procedures   Left Heart Catheterization 09/24/23  Nonobstructive CAD. Patient has patent stents in the proximal LAD and proximal and mid RCA. There is an eccentric stenosis in the LCx that was evaluated with FFR in 2021 and this was normal. Likewise flow wire of the LAD was normal then. There is no change in angiographic appearance of his vessels.  Mildly elevated LVEDP   Plan: recommend continued medical therapy.  Diagnostic Dominance: Right   _____________   History of Present Illness     Cody [name] is a 62 y.o. male with with a past medical history of CAD, HTN, HLD, history of cocaine use, GERD, PAD. Per chart review, patient previously has had 4 stents placed total. He underwent LHC on 03/10/20 that showed patent stent in the mid LAD without restenosis, moderate mid LAD stenosis prior to the stent, moderate mid circumflex stenosis that was no hemodynamically significant by DFR, overlapping mid and distal RCA stents without restenosis. Later, echocardiogram on 07/14/20 showed EF 55-60%, no regional wall motion abnormalities, moderate LVH, grade II DD, normal RV function. He underwent cardiac catheterization on 07/17/20 that showed stable three-vessel CAD with patent RCA stents, stable patent LAD stent with roughly 50% lesion just proximal to the stent, and stable 70% mid Lcx lesion (DFR negative). There were no obvious culprit lesions to explain patient's chest pain.   More recently, patient underwent nuclear stress test on 03/11/22 that was a  normal, low risk study with EF 48%. Echocardiogram on 03/26/22 showed EF 55-60%, no regional wall motion abnormalities, mild LVH, grade I DD, normal RV function.    Patient was last seen by Dr. Tomie China on 03/24/23. At that time, patient was doing well. Denied chest pain.    He presented to the ED at [facility] on 09/22/23 complaining of 2 days of left sided chest pain, diaphoresis. In the ED, troponin were negative x3. K 3.4, creatinine 0.90, pro-BNP 163. D-Dimer elevated to 4.22. CTA chest showed no PE, no thoracic aortic aneurysm/dissection. When at [facility], patient was seen by [facility]. As patient continued to have episodes of chest pain, it was recommended that he be admitted to [facility] for possible cardiac catheterization.   Hospital Course     Unstable Angina  Coronary Artery Disease  - Patient has had 4 stents placed in the past. LHC on 03/10/20 showed patent stent in the mid LAD, moderate mid LAD stenosis prior to the stent, moderate mid circumflex stenosis that was not hemodynamically significant by DFR, overlapping mid and distal RCA stents without restenosis  - Patient presented to the ED and [facility] on 12/17 complaining of 2 days of intermittent chest pain. hsTn neg x3. He was transferred to [facility] for cardiac catheterization  - Underwent LHC on 12/19 that was unchanged from prior- showed nonobstructive CAD with patent stents in the proximal LAD and proximal-mid RCA. Recommended continued medical therapy  - Started imdur 30 mg daily this admission  - Continue ASA 81 mg daily, lipitor 80 mg daily, zetia 10 mg daily, carvedilol 12.5  mg BID, ranexa 500 mg BID  - Provided instructions for radial site care on AVS - Arranged follow up in our [place] office on 10/08/23   HLD  - Lipid panel from 03/2023 showed LDL 72, HDL 44, triglycerides 94, total cholesterol 134  - Continue lipitor 80 mg daily, zetia 10 mg daily   HTN  - Continue amlodipine 10 mg daily, carvedilol 12.5 mg BID,  imdur 30 mg daily   Suspected Prostate Cancer  - Patient has an enlarged prostate. No records of any PSA testing in the past.  - PSA checked this admission- elevated to 456. We discussed with our urology team who recommended outpatient evaluation. Patient does not want to follow up in [place]. He already has the number for a urologist in [place]  - Started finasteride for prostate hypertrophy and obstructive symptoms   Hypokalemia  - K was 3.3 on 12/19. Was supplemented with potassium chloride 40 mEq on 12/19 - Gave an additional potassium chloride 40 mEq prior to DC   Patient was seen and examined by Dr. Duke Salvia and was deemed stable for DC. Has follow up on 10/08/23 in [place]    Did the patient have an acute coronary syndrome (MI, NSTEMI, STEMI, etc) this admission?:  No                               Did the patient have a percutaneous coronary intervention (stent / angioplasty)?:  No.        _____________  Discharge Vitals Blood pressure (!) 141/87, pulse 85, temperature 98.8 F (37.1 C), temperature source Oral, resp. rate 20, height 5\' 5"  (1.651 m), weight 76.8 kg, SpO2 98%.  Filed Weights   09/23/23 1555 09/24/23 0449 09/25/23 0451  Weight: 77.6 kg 78.8 kg 76.8 kg    Labs & Radiologic Studies    CBC Recent Labs    09/24/23 0246 09/24/23 1436  WBC 9.6 9.7  HGB 10.3* 11.7*  HCT 30.9* 35.0*  MCV 86.8 87.3  PLT 167 187   Basic Metabolic Panel Recent Labs    86/57/84 0246 09/24/23 1436  NA 139  --   K 3.3*  --   CL 105  --   CO2 27  --   GLUCOSE 167*  --   BUN 18  --   CREATININE 1.11 0.86  CALCIUM 8.7*  --    Liver Function Tests No results for input(s): "AST", "ALT", "ALKPHOS", "BILITOT", "PROT", "ALBUMIN" in the last 72 hours. No results for input(s): "LIPASE", "AMYLASE" in the last 72 hours. High Sensitivity Troponin:   No results for input(s): "TROPONINIHS" in the last 720 hours.  BNP Invalid input(s): "POCBNP" D-Dimer No results for  input(s): "DDIMER" in the last 72 hours. Hemoglobin A1C No results for input(s): "HGBA1C" in the last 72 hours. Fasting Lipid Panel No results for input(s): "CHOL", "HDL", "LDLCALC", "TRIG", "CHOLHDL", "LDLDIRECT" in the last 72 hours. Thyroid Function Tests Recent Labs    09/25/23 0921  TSH 1.489   _____________  CARDIAC CATHETERIZATION Result Date: 09/24/2023 Nonobstructive CAD. Patient has patent stents in the proximal LAD and proximal and mid RCA. There is an eccentric stenosis in the LCx that was evaluated with FFR in 2021 and this was normal. Likewise flow wire of the LAD was normal then. There is no change in angiographic appearance of his vessels. Mildly elevated LVEDP Plan: recommend continued medical therapy.   Disposition   Pt is being  discharged home today in good condition.  Follow-up Plans & Appointments     Discharge Instructions     Diet - low sodium heart healthy   Complete by: As directed    Discharge instructions   Complete by: As directed    Radial Site Care Refer to this sheet in the next few weeks. These instructions provide you with information on caring for yourself after your procedure. Your caregiver may also give you more specific instructions. Your treatment has been planned according to current medical practices, but problems sometimes occur. Call your caregiver if you have any problems or questions after your procedure. HOME CARE INSTRUCTIONS You may shower the day after the procedure. Remove the bandage (dressing) and gently wash the site with plain soap and water. Gently pat the site dry.  Do not apply powder or lotion to the site.  Do not submerge the affected site in water for 3 to 5 days.  Inspect the site at least twice daily.  Do not flex or bend the affected arm for 24 hours.  No lifting over 5 pounds (2.3 kg) for 5 days after your procedure.  Do not drive home if you are discharged the same day of the procedure. Have someone else drive  you.  You may drive 24 hours after the procedure unless otherwise instructed by your caregiver.  What to expect: Any bruising will usually fade within 1 to 2 weeks.  Blood that collects in the tissue (hematoma) may be painful to the touch. It should usually decrease in size and tenderness within 1 to 2 weeks.  SEEK IMMEDIATE MEDICAL CARE IF: You have unusual pain at the radial site.  You have redness, warmth, swelling, or pain at the radial site.  You have drainage (other than a small amount of blood on the dressing).  You have chills.  You have a fever or persistent symptoms for more than 72 hours.  You have a fever and your symptoms suddenly get worse.  Your arm becomes pale, cool, tingly, or numb.  You have heavy bleeding from the site. Hold pressure on the site.   Increase activity slowly   Complete by: As directed         Discharge Medications   Allergies as of 09/25/2023       Reactions   Plavix [clopidogrel] Other (See Comments)   Damaged left eye completely   Iodinated Contrast Media Nausea And Vomiting, Other (See Comments)   Caused the patient to vomit blood- Pt reports that dye caused him to spit up blood   Other Other (See Comments)   Unnamed med for high B/P- Caused profuse sweating and affected the mind   Peanuts [peanut Oil] Hives        Medication List     STOP taking these medications    methylPREDNISolone 4 MG Tbpk tablet Commonly known as: MEDROL DOSEPAK       TAKE these medications    acetaminophen 500 MG tablet Commonly known as: TYLENOL Take 1 tablet (500 mg total) by mouth every 6 (six) hours as needed. What changed: reasons to take this   amLODipine 10 MG tablet Commonly known as: NORVASC Take 1 tablet (10 mg total) by mouth daily.   aspirin EC 81 MG tablet Take 1 tablet (81 mg total) by mouth daily. Swallow whole.   atorvastatin 80 MG tablet Commonly known as: LIPITOR Take 1 tablet (80 mg total) by mouth daily.   carvedilol  12.5 MG tablet Commonly known  as: COREG Take 1 tablet (12.5 mg total) by mouth 2 (two) times daily.   ezetimibe 10 MG tablet Commonly known as: ZETIA Take 1 tablet (10 mg total) by mouth daily.   finasteride 5 MG tablet Commonly known as: PROSCAR Take 1 tablet (5 mg total) by mouth daily. Start taking on: September 26, 2023   isosorbide mononitrate 30 MG 24 hr tablet Commonly known as: IMDUR Take 1 tablet (30 mg total) by mouth daily. Start taking on: September 26, 2023   nitroGLYCERIN 0.4 MG SL tablet Commonly known as: NITROSTAT Place 1 tablet (0.4 mg total) under the tongue every 5 (five) minutes as needed for chest pain.   pantoprazole 40 MG tablet Commonly known as: PROTONIX Take 1 tablet (40 mg total) by mouth daily.   ranolazine 500 MG 12 hr tablet Commonly known as: RANEXA Take 1 tablet (500 mg total) by mouth 2 (two) times daily.           Outstanding Labs/Studies    Duration of Discharge Encounter   Greater than 30 minutes including physician time.  Signed, Jonita Albee, PA-C 09/25/2023, 2:23 PM

## 2023-10-01 ENCOUNTER — Telehealth: Payer: Self-pay | Admitting: Cardiology

## 2023-10-01 DIAGNOSIS — I2510 Atherosclerotic heart disease of native coronary artery without angina pectoris: Secondary | ICD-10-CM

## 2023-10-01 DIAGNOSIS — I10 Essential (primary) hypertension: Secondary | ICD-10-CM

## 2023-10-01 NOTE — Telephone Encounter (Signed)
*  STAT* If patient is at the pharmacy, call can be transferred to refill team.   1. Which medications need to be refilled? (please list name of each medication and dose if known)   ranolazine (RANEXA) 500 MG 12 hr tablet    2. Which pharmacy/location (including street and city if local pharmacy) is medication to be sent to?  [id number] - [place], [place] - [address] AT [facility] [place]    3. Do they need a 30 day or 90 day supply? 90

## 2023-10-01 NOTE — Telephone Encounter (Signed)
Patient would like a call back to discuss results and if he needs to be seen sooner. Please advise.

## 2023-10-01 NOTE — Telephone Encounter (Signed)
Spoke with pt who had multiple complaints regarding [facility], his cardiologist and states that he has not received his results from his stress test 03/2022. I advised that I had spoke with him regarding the results and then he states no not that the doctor did not discuss with me. Pt had multiple issues regarding his urine, rectal pain, penile pain that radiated to his chest while in the hospital. Pt also c/o because no area hospitals have an eye doctor. Advised to see his PCP and go to the ED if chest pain returns. Pt has an appointment in the next week for FU. Pt verbalized understanding and had no additional questions.

## 2023-10-06 NOTE — Progress Notes (Signed)
 Cardiology Office Note:  .   Date:  10/08/2023  ID:  Cody [name], DOB Jan 26, 1961, MRN [id number] PCP: Patient, No Pcp Per  [facility] Providers Cardiologist:  Zayleigh Stroh [name], MD    History of Present Illness: .   Cody [name] is a 62 y.o. male with a past medical history of hypertension, PAD, CAD s/p DES x 4, GERD, BPH, history of tobacco abuse.  09/24/2023 left heart cath revealed nonobstructive CAD, patent stents in the proximal LAD and mid RCA, eccentric stenosis in the left circumflex that was evaluated previously with FFR in 2021 09/23/2023 echo EF 55 to 60%, no valvular abnormalities 01/15/2021 monitor average heart rate 82 bpm, predominant rhythm was sinus overall unremarkable 07/17/2020 left heart cath stable three-vessel CAD with patent RCA stents, and stable LAD stent--no obvious culprit to explain chest pain 03/30/2020 left heart cath RCA with overlapping mid stent and distal stent that are patent, mid LAD D stent without restenosis  Most recently he presented to the emergency department [facility] on 09/22/2023 with complaints of chest pain x 2 days and diaphoresis, troponins were negative x 3, D-dimer was elevated however CTA of the chest revealed no PE, he continued to have episodes of chest pain and was eventually sent to [facility] for cardiac catheterization.  He ultimately underwent left heart cath on 09/24/2023 revealing patent stents, no culprit for episodes of chest pain.  His PSA was elevated and he was advised to follow-up outpatient with urology.  He presents today for follow-up after his recent left heart cath as outlined above.  He has several medical concerns that are bothering him today including chronic visual complaints, needing to see a GI specialist, needing to see urology.  He also follows laterally with his cardiologist in [place].  Regarding his coronary artery disease, he offers no formal complaints.  He has not fully returned to his baseline  activities secondary to fatigue however, he denies chest pain, palpitations, dyspnea, pnd, orthopnea, n, v, dizziness, syncope, edema, weight gain, or early satiety.   ROS: Review of Systems  Genitourinary:  Positive for urgency.     Studies Reviewed: .        Cardiac Studies & Procedures   CARDIAC CATHETERIZATION  CARDIAC CATHETERIZATION 09/24/2023  Narrative Nonobstructive CAD. Patient has patent stents in the proximal LAD and proximal and mid RCA. There is an eccentric stenosis in the LCx that was evaluated with FFR in 2021 and this was normal. Likewise flow wire of the LAD was normal then. There is no change in angiographic appearance of his vessels. Mildly elevated LVEDP  Plan: recommend continued medical therapy.  Findings Coronary Findings Diagnostic  Dominance: Right  Left Anterior Descending Prox LAD lesion is 50% stenosed. The lesion is discrete. Prox LAD to Mid LAD lesion is 5% stenosed. The lesion was previously treated using a drug eluting stent over 2 years ago.  First Diagonal Branch Vessel is small in size.  Lateral First Diagonal Branch Vessel is small in size.  Left Circumflex Vessel is large. Prox Cx lesion is 70% stenosed with 30% stenosed side branch in 1st Mrg. The lesion is focal and eccentric. DFR 0.97 - NOT significant.  Third Obtuse Marginal Branch Vessel is small in size.  Left Posterior Atrioventricular Artery Vessel is small in size.  Right Coronary Artery Prox RCA-1 lesion is 30% stenosed. The lesion is focal. Prox RCA-2 lesion is 10% stenosed. The lesion was previously treated using a drug eluting stent over 2  years ago. Previously placed stent displays restenosis. Prox RCA to Mid RCA lesion is 30% stenosed. The lesion is focal and concentric. Mid RCA to Dist RCA lesion is 20% stenosed. The lesion was previously treated .  Acute Marginal Branch Vessel is small in size.  Right Ventricular Branch Vessel is small in size.  Right  Posterior Atrioventricular Artery Vessel is small in size.  Intervention  No interventions have been documented.   CARDIAC CATHETERIZATION  CARDIAC CATHETERIZATION 07/17/2020  Narrative  The left ventricular systolic function is normal. The left ventricular ejection fraction is 45-50% by visual estimate.  LV end diastolic pressure is normal.  Prox RCA-1 lesion is 30% stenosed.  Prox RCA-2 STENT is 10% stenosed.  Prox RCA to Mid RCA lesion is 30% stenosed.  Mid RCA to Dist RCA STENT is 20% stenosed.  Prox LAD lesion is 50% stenosed. - FFR 0.84 (Not significant)  Prox LAD to Mid LAD STENT is 5% stenosed.  Prox Cx lesion is 70% stenosed with 30% stenosed side branch in 1st Mrg. - DFR 0.97 (Negative)  SUMMARY  Stable three-vessel CAD with patent RCA stents and minimal diffuse disease in the RCA, stable patent LAD stent with roughly 50% (FFR negative) lesion just proximal to the stent, stable roughly 70% mid LCx (DFR negative) lesion.  Poor imaging on LV gram.  EF appears to be somewhat down, but this is not consistent with echocardiogram findings.  No obvious culprit lesion to explain resting chest pain.  Recommend evaluated nonischemic/noncardiac etiology.   RECOMMENDATIONS  Return to nursing unit for ongoing care.  Continue aggressive medical management.  Considered nonanginal cause for chest pain that was resting upon arrival to the Cath Lab.  Although angiographically the most significant potential lesion is the LCx lesion this was actually grossly normal by DFR.   Alm Clay, MD  Findings Coronary Findings Diagnostic  Dominance: Right  Left Anterior Descending Prox LAD lesion is 50% stenosed. The lesion is discrete. Pressure wire/FFR was performed on the lesion. FFR: 0.84. Prox LAD to Mid LAD lesion is 5% stenosed. The lesion was previously treated using a drug eluting stent over 2 years ago.  First Diagonal Branch Vessel is small in size.  Lateral  First Diagonal Branch Vessel is small in size.  Left Circumflex Vessel is large. Prox Cx lesion is 70% stenosed with 30% stenosed side branch in 1st Mrg. The lesion is focal and eccentric. Pressure wire/FFR was performed on the lesion. 0.97 DFR 0.97 - NOT significant.  Third Obtuse Marginal Branch Vessel is small in size.  Left Posterior Atrioventricular Artery Vessel is small in size.  Right Coronary Artery Prox RCA-1 lesion is 30% stenosed. The lesion is focal. Prox RCA-2 lesion is 10% stenosed. The lesion was previously treated using a drug eluting stent over 2 years ago. Previously placed stent displays restenosis. Prox RCA to Mid RCA lesion is 30% stenosed. The lesion is focal and concentric. Mid RCA to Dist RCA lesion is 20% stenosed. The lesion was previously treated.  Acute Marginal Branch Vessel is small in size.  Right Ventricular Branch Vessel is small in size.  Right Posterior Atrioventricular Artery Vessel is small in size.  Intervention  No interventions have been documented.   STRESS TESTS  MYOCARDIAL PERFUSION IMAGING 03/11/2022  Narrative   The study is normal. The study is low risk.   Left ventricular function is abnormal. Nuclear stress EF: 48 %. The left ventricular ejection fraction is mildly decreased (45-54%). End diastolic cavity size  is normal.   Prior study available for comparison from 03/11/2018.  ECHOCARDIOGRAM  ECHOCARDIOGRAM COMPLETE 03/26/2022  Narrative ECHOCARDIOGRAM REPORT    Patient Name:   Cody [name] Date of Exam: 03/26/2022 Medical Rec #:  [id number]      Height:       66.0 in Accession #:    [id number]     Weight:       178.0 lb Date of Birth:  [birth year]       BSA:          1.903 m Patient Age:    61 years       BP:           120/76 mmHg Patient Gender: M              HR:           46 bpm. Exam Location:  [place]  Procedure: 2D Echo, 3D Echo, Cardiac Doppler, Color Doppler and Strain Analysis  Indications:    Coronary  artery disease involving native coronary artery of native heart without angina pectoris [I25.10 (ICD-10-CM)]  History:        Patient has prior history of Echocardiogram examinations, most recent 07/14/2020. CAD, PAD; Risk Factors:Hypertension.  Sonographer:    Charlie Jointer RDCS Referring Phys: CYRUS Jacksen Isip [facility]UNDERS [facility]  IMPRESSIONS   1. Left ventricular ejection fraction, by estimation, is 55 to 60%. The left ventricle has normal function. The left ventricle has no regional wall motion abnormalities. There is mild concentric left ventricular hypertrophy. Left ventricular diastolic parameters are consistent with Grade I diastolic dysfunction (impaired relaxation). 2. Right ventricular systolic function is normal. The right ventricular size is normal. 3. The mitral valve is normal in structure. No evidence of mitral valve regurgitation. No evidence of mitral stenosis. 4. The aortic valve is tricuspid. Aortic valve regurgitation is not visualized. No aortic stenosis is present. 5. Aortic Normal DTA. 6. The inferior vena cava is normal in size with greater than 50% respiratory variability, suggesting right atrial pressure of 3 mmHg.  FINDINGS Left Ventricle: Left ventricular ejection fraction, by estimation, is 55 to 60%. The left ventricle has normal function. The left ventricle has no regional wall motion abnormalities. The left ventricular internal cavity size was normal in size. There is mild concentric left ventricular hypertrophy. Left ventricular diastolic parameters are consistent with Grade I diastolic dysfunction (impaired relaxation). Indeterminate filling pressures.  Right Ventricle: The right ventricular size is normal. No increase in right ventricular wall thickness. Right ventricular systolic function is normal.  Left Atrium: Left atrial size was normal in size.  Right Atrium: Right atrial size was normal in size.  Pericardium: There is no evidence of pericardial  effusion.  Mitral Valve: The mitral valve is normal in structure. No evidence of mitral valve regurgitation. No evidence of mitral valve stenosis.  Tricuspid Valve: The tricuspid valve is normal in structure. Tricuspid valve regurgitation is not demonstrated. No evidence of tricuspid stenosis.  Aortic Valve: The aortic valve is tricuspid. Aortic valve regurgitation is not visualized. No aortic stenosis is present.  Pulmonic Valve: The pulmonic valve was normal in structure. Pulmonic valve regurgitation is not visualized. No evidence of pulmonic stenosis.  Aorta: The aortic root and ascending aorta are structurally normal, with no evidence of dilitation, the aortic arch was not well visualized and Normal DTA.  Venous: A normal flow pattern is recorded from the right upper pulmonary vein. The inferior vena cava is normal in size with greater than 50% respiratory  variability, suggesting right atrial pressure of 3 mmHg.  IAS/Shunts: No atrial level shunt detected by color flow Doppler.   LEFT VENTRICLE PLAX 2D LVIDd:         5.00 cm   Diastology LVIDs:         3.70 cm   LV e' medial:    5.66 cm/s LV PW:         1.10 cm   LV E/e' medial:  12.6 LV IVS:        1.40 cm   LV e' lateral:   8.49 cm/s LVOT diam:     2.10 cm   LV E/e' lateral: 8.4 LV SV:         76 LV SV Index:   40        2D Longitudinal Strain LVOT Area:     3.46 cm  2D Strain GLS Avg:     -14.5 %  3D Volume EF: 3D EF:        52 % LV EDV:       182 ml LV ESV:       86 ml LV SV:        95 ml  RIGHT VENTRICLE             IVC RV Basal diam:  2.60 cm     IVC diam: 1.10 cm RV S prime:     12.60 cm/s TAPSE (M-mode): 2.7 cm  LEFT ATRIUM             Index        RIGHT ATRIUM           Index LA diam:        4.00 cm 2.10 cm/m   RA Area:     17.50 cm LA Vol (A2C):   52.0 ml 27.30 ml/m  RA Volume:   45.00 ml  23.64 ml/m LA Vol (A4C):   52.2 ml 27.43 ml/m LA Biplane Vol: 55.7 ml 29.27 ml/m AORTIC VALVE LVOT Vmax:   99.80  cm/s LVOT Vmean:  73.400 cm/s LVOT VTI:    0.219 m  AORTA Ao Root diam: 3.10 cm Ao Asc diam:  3.60 cm Ao Desc diam: 2.20 cm  MITRAL VALVE MV Area (PHT): 2.77 cm    SHUNTS MV Decel Time: 274 msec    Systemic VTI:  0.22 m MV E velocity: 71.10 cm/s  Systemic Diam: 2.10 cm MV A velocity: 76.30 cm/s MV E/A ratio:  0.93  Redell Leiter MD Electronically signed by Redell Leiter MD Signature Date/Time: 03/26/2022/5:27:03 PM    Final   MONITORS  LONG TERM MONITOR (3-14 DAYS) 01/28/2021  Narrative The patient wore the monitor for 6 days 5 hours starting January 15, 2021. Indication: Palpitations  The minimum heart rate was 51 bpm, maximum heart rate was 150 bpm, and average heart rate was 82 bpm. Predominant underlying rhythm was Sinus Rhythm.   Premature atrial complexes were rare less than 1%. Premature Ventricular complexes were rare less than 1%.  No ventricular tachycardia, no pauses, No AV block, no supraventricular tachycardia and no atrial fibrillation present. 3 patient triggered events all associated with sinus rhythm and sinus tachycardia.  Conclusion: Normal/unremarkable study with no evidence of significant arrhythmia.           Risk Assessment/Calculations:             Physical Exam:   VS:  BP 106/68 (BP Location: Left Arm, Patient Position: Sitting, Cuff Size: Normal)  Pulse 85   Ht 5' 5 (1.651 m)   Wt 169 lb (76.7 kg)   SpO2 95%   BMI 28.12 kg/m    Wt Readings from Last 3 Encounters:  10/08/23 169 lb (76.7 kg)  09/25/23 169 lb 6.4 oz (76.8 kg)  03/24/23 177 lb 3.2 oz (80.4 kg)    GEN: Well nourished, well developed in no acute distress NECK: No JVD; No carotid bruits CARDIAC: RRR, no murmurs, rubs, gallops RESPIRATORY:  Clear to auscultation without rales, wheezing or rhonchi  ABDOMEN: Soft, non-tender, non-distended EXTREMITIES:  No edema; No deformity   ASSESSMENT AND PLAN: .   CAD - s/p left heart cath 09/24/2023 revealed nonobstructive CAD,  patent stents in the proximal LAD and mid RCA, eccentric stenosis in the left circumflex that was evaluated previously with FFR in 2021. Stable with no anginal symptoms. No indication for ischemic evaluation.  Continue aspirin  81 mg daily, continue Norvasc  10 mg daily, continue Coreg  12.5 mg twice daily, continue Lipitor  80 daily, continue Zetia  10 mg daily, continue Imdur  30 mg daily, continue NTG PRN, continue Ranexa  500 mg twice daily. Heart healthy diet and regular cardiovascular exercise encouraged. Will repeat CBC as hgb dropped slightly during his hospitalization.   Hypokalemia-3.3 at discharge, will recheck BMET.  HTN -blood pressure is well-controlled at 106/68, continue carvedilol  12.5 mg twice daily, continue clonidine  0.1 mg twice daily-as he has been on this for greater than 10 years and was started by his cardiologist in [place].  Tobacco abuse-currently smoking about a pack of cigarettes a week, he is trying to slowly stop.  Feels like he can stop on his own.  Elevated PSA -was advised to follow-up with urology after his most recent hospitalization, states he has not heard from them and this is causing him concern.  Will refer to urology.       Dispo: Refer to PCP to establish care, refer to urology for evaluation of elevated PSA.  Repeat CBC, BMET, lipid panel.  Signed, Delon JAYSON Hoover, NP

## 2023-10-08 ENCOUNTER — Ambulatory Visit: Payer: Medicare Other | Attending: Cardiology | Admitting: Cardiology

## 2023-10-08 ENCOUNTER — Encounter: Payer: Self-pay | Admitting: Cardiology

## 2023-10-08 VITALS — BP 106/68 | HR 85 | Ht 65.0 in | Wt 169.0 lb

## 2023-10-08 DIAGNOSIS — I2510 Atherosclerotic heart disease of native coronary artery without angina pectoris: Secondary | ICD-10-CM | POA: Diagnosis present

## 2023-10-08 DIAGNOSIS — R972 Elevated prostate specific antigen [PSA]: Secondary | ICD-10-CM

## 2023-10-08 DIAGNOSIS — Z720 Tobacco use: Secondary | ICD-10-CM

## 2023-10-08 DIAGNOSIS — E876 Hypokalemia: Secondary | ICD-10-CM | POA: Diagnosis present

## 2023-10-08 DIAGNOSIS — I10 Essential (primary) hypertension: Secondary | ICD-10-CM

## 2023-10-08 DIAGNOSIS — Z758 Other problems related to medical facilities and other health care: Secondary | ICD-10-CM | POA: Diagnosis present

## 2023-10-08 MED ORDER — RANOLAZINE ER 500 MG PO TB12: 500.0000 mg | ORAL_TABLET | Freq: Two times a day (BID) | ORAL | 3 refills | Status: DC

## 2023-10-08 NOTE — Patient Instructions (Signed)
 Medication Instructions:  Your physician recommends that you continue on your current medications as directed. Please refer to the Current Medication list given to you today.  *If you need a refill on your cardiac medications before your next appointment, please call your pharmacy*   Lab Work: Your physician recommends that you have labs done in the office today. Your test included  basic metabolic panel, complete blood count,and lipids.  If you have labs (blood work) drawn today and your tests are completely normal, you will receive your results only by: MyChart Message (if you have MyChart) OR A paper copy in the mail If you have any lab test that is abnormal or we need to change your treatment, we will call you to review the results.   Testing/Procedures: None Ordered   Follow-Up: At [facility], you and your health needs are our priority.  As part of our continuing mission to provide you with exceptional heart care, we have created designated Provider Care Teams.  These Care Teams include your primary Cardiologist (physician) and Advanced Practice Providers (APPs -  Physician Assistants and Nurse Practitioners) who all work together to provide you with the care you need, when you need it.  We recommend signing up for the patient portal called MyChart.  Sign up information is provided on this After Visit Summary.  MyChart is used to connect with patients for Virtual Visits (Telemedicine).  Patients are able to view lab/test results, encounter notes, upcoming appointments, etc.  Non-urgent messages can be sent to your provider as well.   To learn more about what you can do with MyChart, go to [email or web address].    Your next appointment:   6 month follow up

## 2023-10-09 ENCOUNTER — Telehealth: Payer: Self-pay | Admitting: Emergency Medicine

## 2023-10-09 ENCOUNTER — Other Ambulatory Visit: Payer: Self-pay | Admitting: Cardiology

## 2023-10-09 ENCOUNTER — Other Ambulatory Visit (HOSPITAL_COMMUNITY): Payer: Self-pay

## 2023-10-09 ENCOUNTER — Telehealth: Payer: Self-pay | Admitting: Cardiology

## 2023-10-09 ENCOUNTER — Telehealth: Payer: Self-pay | Admitting: Pharmacy Technician

## 2023-10-09 DIAGNOSIS — I2510 Atherosclerotic heart disease of native coronary artery without angina pectoris: Secondary | ICD-10-CM

## 2023-10-09 LAB — CBC
Hematocrit: 33.1 % — ABNORMAL LOW (ref 37.5–51.0)
Hemoglobin: 11 g/dL — ABNORMAL LOW (ref 13.0–17.7)
MCH: 29 pg (ref 26.6–33.0)
MCHC: 33.2 g/dL (ref 31.5–35.7)
MCV: 87 fL (ref 79–97)
Platelets: 187 x10E3/uL (ref 150–450)
RBC: 3.79 x10E6/uL — ABNORMAL LOW (ref 4.14–5.80)
RDW: 13 % (ref 11.6–15.4)
WBC: 6 x10E3/uL (ref 3.4–10.8)

## 2023-10-09 LAB — LIPID PANEL
Chol/HDL Ratio: 4.1 ratio (ref 0.0–5.0)
Cholesterol, Total: 123 mg/dL (ref 100–199)
HDL: 30 mg/dL — ABNORMAL LOW
LDL Chol Calc (NIH): 73 mg/dL (ref 0–99)
Triglycerides: 108 mg/dL (ref 0–149)
VLDL Cholesterol Cal: 20 mg/dL (ref 5–40)

## 2023-10-09 LAB — BASIC METABOLIC PANEL WITH GFR
BUN/Creatinine Ratio: 13 (ref 10–24)
BUN: 17 mg/dL (ref 8–27)
CO2: 27 mmol/L (ref 20–29)
Calcium: 9.7 mg/dL (ref 8.6–10.2)
Chloride: 99 mmol/L (ref 96–106)
Creatinine, Ser: 1.3 mg/dL — ABNORMAL HIGH (ref 0.76–1.27)
Glucose: 118 mg/dL — ABNORMAL HIGH (ref 70–99)
Potassium: 3.9 mmol/L (ref 3.5–5.2)
Sodium: 143 mmol/L (ref 134–144)
eGFR: 63 mL/min/1.73

## 2023-10-09 MED ORDER — EVOLOCUMAB 140 MG/ML ~~LOC~~ SOAJ
140.0000 mg | SUBCUTANEOUS | 2 refills | Status: DC
  Filled 2023-10-09 – 2023-10-12 (×2): qty 2, 28d supply, fill #0

## 2023-10-09 MED ORDER — REPATHA SURECLICK 140 MG/ML ~~LOC~~ SOAJ: 140.0000 mg | SUBCUTANEOUS | 1 refills | Status: DC

## 2023-10-09 NOTE — Addendum Note (Signed)
 Addended by: Lonia Farber on: 10/09/2023 04:02 PM   Modules accepted: Orders

## 2023-10-09 NOTE — Telephone Encounter (Signed)
-----   Message from Delon JAYSON Hoover sent at 10/09/2023  8:22 AM EST ----- Appears he may be dehydrated, increase intake of water.   Stable known anemia.   Cholesterol needs to be better. I recommend he start Repatha  140 mg [place] every two weeks and stop zetia .

## 2023-10-09 NOTE — Telephone Encounter (Signed)
 Pharmacy Patient Advocate Encounter   Received notification from Pt Calls Messages that prior authorization for repatha  is required/requested.   Insurance verification completed.   The patient is insured through [facility] .   Per test claim: PA required; PA submitted to above mentioned insurance via CoverMyMeds Key/confirmation #/EOC [id number] Status is pending

## 2023-10-09 NOTE — Telephone Encounter (Signed)
 Results reviewed with pt as per Wallis Bamberg NP's note.  Pt verbalized understanding and had no additional questions. Rx sent to pharmacy.  Routed to PCP.

## 2023-10-09 NOTE — Telephone Encounter (Signed)
 Pharmacy Patient Advocate Encounter  Received notification from [facility] that Prior Authorization for repatha  has been APPROVED from 10/09/23 to 10/05/2098. Ran test claim, Copay is $4.80 one month. This test claim was processed through [facility]- copay amounts may vary at other pharmacies due to pharmacy/plan contracts, or as the patient moves through the different stages of their insurance plan.   PA #/Case ID/Reference #: [id number]

## 2023-10-09 NOTE — Addendum Note (Signed)
 Addended by: Lonia Farber on: 10/09/2023 08:39 AM   Modules accepted: Orders

## 2023-10-09 NOTE — Telephone Encounter (Signed)
 Pt c/o medication issue:  1. Name of Medication:   Evolocumab  (REPATHA  SURECLICK) 140 MG/ML SOAJ    2. How are you currently taking this medication (dosage and times per day)? Not taking  3. Are you having a reaction (difficulty breathing--STAT)? no  4. What is your medication issue? Pharmacy told him his ins does not cover this medication.

## 2023-10-09 NOTE — Telephone Encounter (Signed)
 Spoke to patient who was [facility]able [facility]. Rx sent.

## 2023-10-12 ENCOUNTER — Other Ambulatory Visit (HOSPITAL_BASED_OUTPATIENT_CLINIC_OR_DEPARTMENT_OTHER): Payer: Self-pay

## 2023-10-12 ENCOUNTER — Other Ambulatory Visit (HOSPITAL_COMMUNITY): Payer: Self-pay

## 2023-10-14 ENCOUNTER — Other Ambulatory Visit (HOSPITAL_BASED_OUTPATIENT_CLINIC_OR_DEPARTMENT_OTHER): Payer: Self-pay

## 2023-10-14 ENCOUNTER — Other Ambulatory Visit: Payer: Self-pay

## 2023-10-14 ENCOUNTER — Telehealth: Payer: Self-pay

## 2023-10-14 MED ORDER — EVOLOCUMAB 140 MG/ML ~~LOC~~ SOAJ
140.0000 mg | SUBCUTANEOUS | 2 refills | Status: DC
  Filled 2023-10-14 – 2023-10-19 (×2): qty 2, 28d supply, fill #0
  Filled ????-??-??: fill #0

## 2023-10-14 NOTE — Telephone Encounter (Signed)
 Called patient and informed him of Dr. Madireddy's recommendation below:  If he is requesting an earlier refill, that is fine and he should be able to call in for a refill as needed. If he is requesting replacement dose from the pharmacy without having to pay for an additional dose that was wasted, he will have to check in with the pharmacist to see if they can help waive the cost on one of his doses.  Patient verbalized understanding and a re-fill of his Repatha  was ordered and sent to his pharmacy. Patient had no further questions at this time.

## 2023-10-14 NOTE — Telephone Encounter (Signed)
 Called the patient and his question was regarding his Repatha  medication, not his insulin. He reported that when he attempted to take the Repatha  today he did not hold it against his skin long enough for the medication to inject into his skin. He pulled the pen away from his skin and the medication squirted all over the place. He then took his second pen of Repatha  and held it against his skin long enough for the medication to be injected into his skin. His question is can he get another prescription to cover the dose of Repatha  that he did not inject properly?

## 2023-10-14 NOTE — Telephone Encounter (Signed)
 Pt states he needs to speak with a nurse regarding his insulin shot he messed up yesterday. Please advise

## 2023-10-19 ENCOUNTER — Other Ambulatory Visit (HOSPITAL_BASED_OUTPATIENT_CLINIC_OR_DEPARTMENT_OTHER): Payer: Self-pay

## 2023-10-20 NOTE — Telephone Encounter (Signed)
 Patient is calling back stating he spoke with his pharmacy and they advised he is unable to get a refill until 01/26 for insurance to cover it.   Please advise.

## 2023-10-21 ENCOUNTER — Other Ambulatory Visit: Payer: Self-pay

## 2023-10-21 ENCOUNTER — Other Ambulatory Visit (HOSPITAL_BASED_OUTPATIENT_CLINIC_OR_DEPARTMENT_OTHER): Payer: Self-pay

## 2023-10-21 MED ORDER — EVOLOCUMAB 140 MG/ML ~~LOC~~ SOAJ
140.0000 mg | SUBCUTANEOUS | 6 refills | Status: DC
  Filled 2023-10-21 – 2023-11-02 (×2): qty 2, 28d supply, fill #0

## 2023-10-21 NOTE — Telephone Encounter (Signed)
 Refill sent.

## 2023-10-28 ENCOUNTER — Telehealth: Payer: Self-pay

## 2023-10-28 NOTE — Telephone Encounter (Signed)
Called patient and he reported that he had only 2 bowel movements over the past 2 weeks and it burned when he had to urinate. He also reported being very "sweaty" during the day and his blood pressure was elevated. Spoke with Dr. Dulce Sellar based on the patients symptoms he recommended having the patient follow up with his PCP or go to urgent care or the ER. I relayed Dr. Hulen Shouts recommendation to the patient and he stated that he would go to the ER. Patient had no further questions at this time.

## 2023-10-28 NOTE — Telephone Encounter (Signed)
Patient states that he hasn't be able to use the bathroom. States that the pressure of not using bathroom, is causing his bp to go. Patient would like a call back. Please advise

## 2023-11-02 ENCOUNTER — Other Ambulatory Visit (HOSPITAL_BASED_OUTPATIENT_CLINIC_OR_DEPARTMENT_OTHER): Payer: Self-pay

## 2023-11-06 ENCOUNTER — Telehealth: Payer: Self-pay

## 2023-11-06 NOTE — Telephone Encounter (Signed)
Patient is calling to inform Dr. Vincent Gros that he is currently in the ER at [facility].

## 2023-11-10 ENCOUNTER — Other Ambulatory Visit (HOSPITAL_COMMUNITY): Payer: Self-pay

## 2023-11-16 ENCOUNTER — Telehealth: Payer: Self-pay

## 2023-11-16 NOTE — Telephone Encounter (Signed)
 Pt states he was in the hospital at [facility] and was prescribed Tamulosin 0.4 mg and he hasn't feeling well. He states he is also not sure what it is for. He also stated his arms have cold and when he wakes up he is very sweaty. Please advise.

## 2023-11-16 NOTE — Telephone Encounter (Signed)
 No vm set up.

## 2023-11-16 NOTE — Telephone Encounter (Signed)
 Called patient and he stated that he was able to find out what the Tamulosin medicine was used for. He also stated that his arms were not cold and sweaty anymore. Hi current concern was that he was constipated and felt that he had a blockage in his rectum. I encouraged him to contact his PCP for this concern. Patient verbalized understanding and had no further questions at this time.

## 2023-11-23 ENCOUNTER — Telehealth: Payer: Self-pay

## 2023-11-23 NOTE — Telephone Encounter (Signed)
Pt c/o medication issue:  1. Name of Medication: ranolazine (RANEXA) 500 MG 12 hr tablet  and carvedilol (COREG) 12.5 MG tablet   2. How are you currently taking this medication (dosage and times per day)? As directed  3. Are you having a reaction (difficulty breathing--STAT)? yes  4. What is your medication issue? Patient states that he is seeing spots when he takes this medication, he says it looks like blood. He has been diagnosed recently with stage 4 prostate cancer and had to receive 4 pints of blood. He states that [facility] dismissed his issues and sent him home so patient is upset.

## 2023-11-24 NOTE — Telephone Encounter (Signed)
Patient called again stating he is seeing spots and thinks this may be related to his medication.

## 2023-11-24 NOTE — Telephone Encounter (Signed)
Spoke with pt who states that after taking Ranexa he noted that she started seeing spots and having flashing lights in his eyes. Advised to hold Ranexa. Pt states that he his left eye has damage from Plavix. Please advise

## 2023-11-24 NOTE — Telephone Encounter (Signed)
Pt calling back due to not getting a cb yet and also wants to know if ok to take his repatha injection. Requesting cb

## 2023-11-25 NOTE — Telephone Encounter (Signed)
 Recommendations reviewed with pt as per Dr. Kem Parkinson note.  Pt verbalized understanding and had no additional questions.

## 2024-01-12 ENCOUNTER — Other Ambulatory Visit (HOSPITAL_BASED_OUTPATIENT_CLINIC_OR_DEPARTMENT_OTHER): Payer: Self-pay

## 2024-01-12 ENCOUNTER — Telehealth: Payer: Self-pay | Admitting: Cardiology

## 2024-01-12 DIAGNOSIS — I10 Essential (primary) hypertension: Secondary | ICD-10-CM

## 2024-01-12 DIAGNOSIS — I2510 Atherosclerotic heart disease of native coronary artery without angina pectoris: Secondary | ICD-10-CM

## 2024-01-12 MED ORDER — EVOLOCUMAB 140 MG/ML ~~LOC~~ SOAJ
140.0000 mg | SUBCUTANEOUS | 6 refills | Status: DC
  Filled 2024-01-12: qty 2, 28d supply, fill #0

## 2024-01-12 MED ORDER — ASPIRIN 81 MG PO TBEC: 81.0000 mg | DELAYED_RELEASE_TABLET | Freq: Every day | ORAL | 3 refills | Status: AC

## 2024-01-12 MED ORDER — PANTOPRAZOLE SODIUM 40 MG PO TBEC: 40.0000 mg | DELAYED_RELEASE_TABLET | Freq: Every day | ORAL | 3 refills | Status: AC

## 2024-01-12 MED ORDER — NITROGLYCERIN 0.4 MG SL SUBL: 0.4000 mg | SUBLINGUAL_TABLET | SUBLINGUAL | 6 refills | Status: AC | PRN

## 2024-01-12 MED ORDER — CARVEDILOL 12.5 MG PO TABS: 12.5000 mg | ORAL_TABLET | Freq: Two times a day (BID) | ORAL | 3 refills | Status: AC

## 2024-01-12 MED ORDER — AMLODIPINE BESYLATE 10 MG PO TABS: 10.0000 mg | ORAL_TABLET | Freq: Every day | ORAL | 3 refills | Status: AC

## 2024-01-12 NOTE — Telephone Encounter (Signed)
 Patient coming in today because he needs a refill on   Nitroglycerin Ezetimibe Amlodipine Pantoprazole Carvedilol Aspirin  Patient uses [facility] in [place]  And he would also like Repatha- sent to [facility]  Please call patient when complete [phone]

## 2024-01-12 NOTE — Telephone Encounter (Signed)
 RX sent

## 2024-01-14 ENCOUNTER — Telehealth: Payer: Self-pay

## 2024-01-14 MED ORDER — EVOLOCUMAB 140 MG/ML ~~LOC~~ SOAJ: 140.0000 mg | SUBCUTANEOUS | 6 refills | Status: DC

## 2024-01-14 NOTE — Telephone Encounter (Signed)
 Pt c/o medication issue:  1. Name of Medication:   Evolocumab 140 MG/ML SOAJ    2. How are you currently taking this medication (dosage and times per day)? As written  3. Are you having a reaction (difficulty breathing--STAT)? no  4. What is your medication issue? Pt said med was sent to wrong Pharmacy. [facility] to   [id number] [place], [place] - [address] AT [address] Phone: [phone]  Fax: [phone]    Also pt called off more medications he was taking and they are from other doctors. They were not on his med list. May need a call to go over medications

## 2024-01-27 ENCOUNTER — Ambulatory Visit

## 2024-01-27 VITALS — BP 124/72 | HR 70 | Ht 66.0 in | Wt 160.0 lb

## 2024-01-27 DIAGNOSIS — I10 Essential (primary) hypertension: Secondary | ICD-10-CM | POA: Insufficient documentation

## 2024-01-27 DIAGNOSIS — E7800 Pure hypercholesterolemia, unspecified: Secondary | ICD-10-CM | POA: Insufficient documentation

## 2024-01-27 DIAGNOSIS — I2510 Atherosclerotic heart disease of native coronary artery without angina pectoris: Secondary | ICD-10-CM | POA: Insufficient documentation

## 2024-01-27 NOTE — Patient Instructions (Signed)
 Medication Instructions:  Your physician recommends that you continue on your current medications as directed. Please refer to the Current Medication list given to you today.  *If you need a refill on your cardiac medications before your next appointment, please call your pharmacy*   Lab Work: None Ordered If you have labs (blood work) drawn today and your tests are completely normal, you will receive your results only by: MyChart Message (if you have MyChart) OR A paper copy in the mail If you have any lab test that is abnormal or we need to change your treatment, we will call you to review the results.   Testing/Procedures: None Ordered   Follow-Up: At [facility], you and your health needs are our priority.  As part of our continuing mission to provide you with exceptional heart care, we have created designated Provider Care Teams.  These Care Teams include your primary Cardiologist (physician) and Advanced Practice Providers (APPs -  Physician Assistants and Nurse Practitioners) who all work together to provide you with the care you need, when you need it.  We recommend signing up for the patient portal called "MyChart".  Sign up information is provided on this After Visit Summary.  MyChart is used to connect with patients for Virtual Visits (Telemedicine).  Patients are able to view lab/test results, encounter notes, upcoming appointments, etc.  Non-urgent messages can be sent to your provider as well.   To learn more about what you can do with MyChart, go to [email or web address].    Your next appointment:   6 month follow up

## 2024-01-27 NOTE — Assessment & Plan Note (Signed)
 Blood pressure well-controlled. Continue current medications amlodipine , carvedilol , clonidine  0.1 mg twice daily.

## 2024-01-27 NOTE — Progress Notes (Signed)
 Cardiology Consultation:    Date:  01/27/2024   ID:  Cody [name], DOB [birth year], MRN [id number]  PCP:  Patient, No Pcp Per  Cardiologist:  Daymon Evans Mansa Willers, MD   Referring MD: No ref. provider found   No chief complaint on file.    ASSESSMENT AND PLAN:   Cody [name] 63 year old with history of  CAD s/p PCI in the past, most recent cath 09/24/2023 showing nonobstructive CAD with patent stents in proximal LAD and mid RCA and eccentric stenosis of the left circumflex, with normal biventricular function on echocardiogram December 2024 with EF 55 to 60% and no significant valve abnormalities, peripheral arterial disease, tobacco use, anemia, BPH now diagnosed with metastatic prostate cancer and following up with [facility] and anticipating chemotherapy initiation Problem List Items Addressed This Visit     CAD (coronary artery disease) - Primary   Good functional status at baseline. Continue antiplatelet therapy with aspirin  81 mg once daily. Continue antianginal regimen with amlodipine  10 mg once daily Carvedilol  12.5 mg twice daily Did not tolerate Ranexa  and discontinued couple weeks ago due to visual symptoms.         Relevant Medications   atorvastatin  (LIPITOR ) 40 MG tablet   ezetimibe  (ZETIA ) 10 MG tablet   HTN (hypertension) (Chronic)   Blood pressure well-controlled. Continue current medications amlodipine , carvedilol , clonidine  0.1 mg twice daily.       Relevant Medications   atorvastatin  (LIPITOR ) 40 MG tablet   ezetimibe  (ZETIA ) 10 MG tablet   Pure hypercholesterolemia   Hyperlipidemia, near optimal control on last lipid panel 10-08-2023 total cholesterol 123, HDL 30, LDL 73, triglycerides 108 Continue with atorvastatin  40 mg once daily Continue evolocumab  140 mg subcutaneous injection once every 2 weeks Continue Zetia  10 mg once daily.       Relevant Medications   atorvastatin  (LIPITOR ) 40 MG tablet   ezetimibe  (ZETIA ) 10 MG tablet   Return to  clinic tentatively in 6 months.    History of Present Illness:    Cody [name] is a 64 y.o. male who is being seen today for follow-up visit. Last visit at our office was 10-08-2023 with Pattricia Bores, NP-C. I have seen him previously at [facility] in December 2024 in the setting of chest pain and was transferred to [facility] for further evaluation.  History of CAD s/p PCI in the past, most recent cath 09/24/2023 showing nonobstructive CAD with patent stents in proximal LAD and mid RCA and eccentric stenosis of the left circumflex, with normal biventricular function on echocardiogram December 2024 with EF 55 to 60% and no significant valve abnormalities, peripheral arterial disease, tobacco use, anemia, BPH now diagnosed with metastatic prostate cancer and following up with [facility] and anticipating chemotherapy initiation.  Mentions overall he is doing well.  Denies any chest pain or shortness of breath.  Denies any anginal symptoms. Denies any blood in urine or stools. Does continue to have chest wall pain and back pain for which she is on oxycodone  attributed to metastatic prostate cancer. Mentions he has had symptoms related to the vision attributed to Ranexa  and discontinued the medication few weeks ago and has been feeling better.  Compliant with rest of his medications. Was taking his clonidine  1 time a day rather than 2 times as prescribed.  With underlying metastatic prostate cancer and severe anemia required multiple blood transfusions and has since establish care with [facility] for cancer care and pending chemotherapy initiation.  Lives with his wife at home.  Blood work from 01-11-2024 with hemoglobin 9.3, hematocrit 26.7, platelets 251, WBC 5.5.    comprehensive metabolic panel unremarkable except for mildly elevated alkaline phosphatase 196.    Past Medical History:  Diagnosis Date   Ambulatory dysfunction 07/17/2020   Benign prostatic  hyperplasia with urinary hesitancy 09/24/2023   CAD (coronary artery disease) 05/30/2020   CAP (community acquired pneumonia) 03/31/2020   Chest pain 09/24/2012   Nuclear study normal the patient demanded coronary angiography because of prior false-negative studies   Chronic chest pain with high risk for CAD    Cocaine abuse (HCC) 09/20/2012   Constipation 09/22/2012   Coronary artery disease    GERD (gastroesophageal reflux disease)    has stomach ulcers a while back   Groin pain, chronic, right    Hiatal hernia    HTN (hypertension)    Hypokalemia    Leukocytosis    PAD (peripheral artery disease) (HCC) 09/20/2012   Positive cardiac stress test 03/29/2020   PUD (peptic ulcer disease)    Pure hypercholesterolemia 09/24/2023   Retinal vein occlusion 09/24/2012   Sciatica of right side 05/30/2020   Smoker 09/20/2012   Unstable angina (HCC) 09/24/2023   Visual field defect 09/21/2012    Past Surgical History:  Procedure Laterality Date   BIOPSY  07/19/2020   Procedure: BIOPSY;  Surgeon: Lajuan Pila, MD;  Location: [facility] ENDOSCOPY;  Service: Gastroenterology;;   CORONARY PRESSURE/FFR STUDY N/A 03/30/2020   Procedure: INTRAVASCULAR PRESSURE WIRE/FFR STUDY;  Surgeon: Odie Benne, MD;  Location: [facility] INVASIVE CV LAB;  Service: Cardiovascular;  Laterality: N/A;   CORONARY PRESSURE/FFR STUDY N/A 07/17/2020   Procedure: INTRAVASCULAR PRESSURE WIRE/FFR STUDY;  Surgeon: Arleen Lacer, MD;  Location: [facility] INVASIVE CV LAB;  Service: Cardiovascular;  Laterality: N/A;   CORONARY STENT PLACEMENT     ESOPHAGOGASTRODUODENOSCOPY (EGD) WITH PROPOFOL  N/A 07/19/2020   Procedure: ESOPHAGOGASTRODUODENOSCOPY (EGD) WITH PROPOFOL ;  Surgeon: Lajuan Pila, MD;  Location: [facility] ENDOSCOPY;  Service: Gastroenterology;  Laterality: N/A;   LEFT HEART CATH AND CORONARY ANGIOGRAPHY N/A 03/30/2020   Procedure: LEFT HEART CATH AND CORONARY ANGIOGRAPHY;  Surgeon: Odie Benne, MD;  Location: [facility]  INVASIVE CV LAB;  Service: Cardiovascular;  Laterality: N/A;   LEFT HEART CATH AND CORONARY ANGIOGRAPHY N/A 07/17/2020   Procedure: LEFT HEART CATH AND CORONARY ANGIOGRAPHY;  Surgeon: Arleen Lacer, MD;  Location: [facility] INVASIVE CV LAB;  Service: Cardiovascular;  Laterality: N/A;   LEFT HEART CATH AND CORONARY ANGIOGRAPHY N/A 09/24/2023   Procedure: LEFT HEART CATH AND CORONARY ANGIOGRAPHY;  Surgeon: Swaziland, Peter M, MD;  Location: [facility] INVASIVE CV LAB;  Service: Cardiovascular;  Laterality: N/A;   LEFT HEART CATHETERIZATION WITH CORONARY ANGIOGRAM N/A 09/24/2012   Procedure: LEFT HEART CATHETERIZATION WITH CORONARY ANGIOGRAM;  Surgeon: Mickiel Albany, MD;  Location: [facility] CATH LAB;  Service: Cardiovascular;  Laterality: N/A;    Current Medications: Current Meds  Medication Sig   amLODipine  (NORVASC ) 10 MG tablet Take 1 tablet (10 mg total) by mouth daily.   aspirin  EC 81 MG tablet Take 1 tablet (81 mg total) by mouth daily. Swallow whole.   atorvastatin  (LIPITOR ) 40 MG tablet Take 40 mg by mouth daily.   Calcium  Citrate-Vitamin D (CALCIUM  CITRATE + D PO) Take 1 tablet by mouth daily.   carvedilol  (COREG ) 12.5 MG tablet Take 1 tablet (12.5 mg total) by mouth 2 (two) times daily.   cloNIDine  (CATAPRES ) 0.1 MG tablet Take 0.1 mg by mouth  2 (two) times daily.   darolutamide (NUBEQA) 300 MG tablet Take 600 mg by mouth 2 (two) times daily.   Evolocumab  140 MG/ML SOAJ Inject 140 mg into the skin every 14 (fourteen) days.   ezetimibe  (ZETIA ) 10 MG tablet Take 10 mg by mouth daily.   finasteride  (PROSCAR ) 5 MG tablet Take 1 tablet (5 mg total) by mouth daily.   nitroGLYCERIN  (NITROSTAT ) 0.4 MG SL tablet Place 1 tablet (0.4 mg total) under the tongue every 5 (five) minutes as needed for chest pain.   oxycodone  (OXY-IR) 5 MG capsule Take 5 mg by mouth every 6 (six) hours as needed for pain.   pantoprazole  (PROTONIX ) 40 MG tablet Take 1 tablet (40 mg total) by mouth daily.   venlafaxine XR (EFFEXOR-XR)  37.5 MG 24 hr capsule Take 37.5 mg by mouth daily.     Allergies:   Plavix  [clopidogrel ], Iodinated contrast media, Other, and Peanuts [peanut oil]   Social History   Socioeconomic History   Marital status: Married    Spouse name: Not on file   Number of children: Not on file   Years of education: Not on file   Highest education level: Not on file  Occupational History   Not on file  Tobacco Use   Smoking status: Every Day    Types: Cigarettes   Smokeless tobacco: Never  Vaping Use   Vaping status: Never Used  Substance and Sexual Activity   Alcohol  use: Yes    Alcohol /week: 3.0 - 4.0 standard drinks of alcohol     Types: 2 Cans of beer, 1 - 2 Shots of liquor per week   Drug use: Yes    Types: Cocaine   Sexual activity: Not on file  Other Topics Concern   Not on file  Social History Narrative   Not on file   Social Drivers of Health   Financial Resource Strain: High Risk (11/25/2023)   Received from [facility]   Overall Financial Resource Strain (CARDIA)    Difficulty of Paying Living Expenses: Very hard  Food Insecurity: Food Insecurity Present (11/25/2023)   Received from [facility]   Hunger Vital Sign    Worried About Running Out of Food in the Last Year: Sometimes true    Ran Out of Food in the Last Year: Sometimes true  Transportation Needs: Unmet Transportation Needs (11/25/2023)   Received from [facility]   PRAPARE - Transportation    Lack of Transportation (Medical): Yes    Lack of Transportation (Non-Medical): No  Physical Activity: Not on file  Stress: Not on file  Social Connections: Not on file     Family History: The patient's family history includes CAD in his brother; Diabetes in his father; Hypertension in his mother. ROS:   Please see the history of present illness.    All 14 point review of systems negative except as described per history of present illness.  EKGs/Labs/Other Studies Reviewed:    The following studies were  reviewed today:   EKG:       Recent Labs: 03/24/2023: ALT 20 09/25/2023: TSH 1.489 10/08/2023: BUN 17; Creatinine, Ser 1.30; Hemoglobin 11.0; Platelets 187; Potassium 3.9; Sodium 143  Recent Lipid Panel    Component Value Date/Time   CHOL 123 10/08/2023 0854   TRIG 108 10/08/2023 0854   HDL 30 (L) 10/08/2023 0854   CHOLHDL 4.1 10/08/2023 0854   CHOLHDL 4.2 07/16/2020 1200   VLDL 16 07/16/2020 1200   LDLCALC 73 10/08/2023 0854  Physical Exam:    VS:  BP 124/72   Pulse 70   Ht 5\' 6"  (1.676 m)   Wt 160 lb (72.6 kg)   SpO2 97%   BMI 25.82 kg/m     Wt Readings from Last 3 Encounters:  01/27/24 160 lb (72.6 kg)  10/08/23 169 lb (76.7 kg)  09/25/23 169 lb 6.4 oz (76.8 kg)     GENERAL:  Well nourished, well developed in no acute distress NECK: No JVD; No carotid bruits CARDIAC: RRR, S1 and S2 present, no murmurs, no rubs, no gallops CHEST:  Clear to auscultation without rales, wheezing or rhonchi  Extremities: No pitting pedal edema. Pulses bilaterally symmetric with radial 2+ and dorsalis pedis 2+ NEUROLOGIC:  Alert and oriented x 3  Medication Adjustments/Labs and Tests Ordered: Current medicines are reviewed at length with the patient today.  Concerns regarding medicines are outlined above.  No orders of the defined types were placed in this encounter.  No orders of the defined types were placed in this encounter.   Signed, Lura Sallies, MD, MPH, [facility]. 01/27/2024 12:15 PM    [facility]

## 2024-01-27 NOTE — Assessment & Plan Note (Signed)
 Good functional status at baseline. Continue antiplatelet therapy with aspirin  81 mg once daily. Continue antianginal regimen with amlodipine  10 mg once daily Carvedilol  12.5 mg twice daily Did not tolerate Ranexa  and discontinued couple weeks ago due to visual symptoms.

## 2024-01-27 NOTE — Assessment & Plan Note (Signed)
 Hyperlipidemia, near optimal control on last lipid panel 10-08-2023 total cholesterol 123, HDL 30, LDL 73, triglycerides 108 Continue with atorvastatin  40 mg once daily Continue evolocumab  140 mg subcutaneous injection once every 2 weeks Continue Zetia  10 mg once daily.

## 2024-02-25 ENCOUNTER — Other Ambulatory Visit (HOSPITAL_BASED_OUTPATIENT_CLINIC_OR_DEPARTMENT_OTHER): Payer: Self-pay

## 2024-02-25 ENCOUNTER — Telehealth: Payer: Self-pay | Admitting: Cardiology

## 2024-02-25 MED ORDER — CLONIDINE HCL 0.1 MG PO TABS: 0.1000 mg | ORAL_TABLET | Freq: Two times a day (BID) | ORAL | 2 refills | Status: AC

## 2024-02-25 MED ORDER — EVOLOCUMAB 140 MG/ML ~~LOC~~ SOAJ
140.0000 mg | SUBCUTANEOUS | 2 refills | Status: DC
  Filled 2024-02-25: qty 6, 84d supply, fill #0

## 2024-02-25 NOTE — Telephone Encounter (Signed)
*  STAT* If patient is at the pharmacy, call can be transferred to refill team.   1. Which medications need to be refilled? (please list name of each medication and dose if known)   Evolocumab  140 MG/ML SOAJ     2. Would you like to learn more about the convenience, safety, & potential cost savings by using the [facility]?      3. Are you open to using the [facility] (Type [facility].  ).   4. Which pharmacy/location (including street and city if local pharmacy) is medication to be sent to?  [facility]    5. Do they need a 30 day or 90 day supply? 90 day

## 2024-02-25 NOTE — Telephone Encounter (Signed)
 [facility] of Clonidine  [facility] [place]

## 2024-02-25 NOTE — Telephone Encounter (Signed)
 Refill of Repatha  140 mg sent to [facility], [place].

## 2024-02-25 NOTE — Telephone Encounter (Signed)
*  STAT* If patient is at the pharmacy, call can be transferred to refill team.   1. Which medications need to be refilled? (please list name of each medication and dose if known) cloNIDine  (CATAPRES ) 0.1 MG tablet    2. Would you like to learn more about the convenience, safety, & potential cost savings by using the [facility]?     3. Are you open to using the [facility] (Type [facility].  ).   4. Which pharmacy/location (including street and city if local pharmacy) is medication to be sent to? [id number] [place],  - [address] [facility] [place] [facility] [place]    5. Do they need a 30 day or 90 day supply? 90 day

## 2024-05-17 ENCOUNTER — Other Ambulatory Visit (HOSPITAL_BASED_OUTPATIENT_CLINIC_OR_DEPARTMENT_OTHER): Payer: Self-pay

## 2024-05-17 ENCOUNTER — Telehealth: Payer: Self-pay

## 2024-05-17 DIAGNOSIS — I2510 Atherosclerotic heart disease of native coronary artery without angina pectoris: Secondary | ICD-10-CM

## 2024-05-17 DIAGNOSIS — E7800 Pure hypercholesterolemia, unspecified: Secondary | ICD-10-CM

## 2024-05-17 MED ORDER — EVOLOCUMAB 140 MG/ML ~~LOC~~ SOAJ
140.0000 mg | SUBCUTANEOUS | 2 refills | Status: AC
  Filled 2024-05-17: qty 6, 84d supply, fill #0
  Filled 2024-10-11: qty 6, 84d supply, fill #1

## 2024-05-17 NOTE — Telephone Encounter (Signed)
 Patient is needing a refill on his Evolocumab  sent to the pharmacy [facility] [place]. CB #  [phone]

## 2024-05-17 NOTE — Telephone Encounter (Signed)
 Routing to pharmacy team to refill.

## 2024-07-11 DIAGNOSIS — Z515 Encounter for palliative care: Secondary | ICD-10-CM

## 2024-07-11 DIAGNOSIS — C7951 Secondary malignant neoplasm of bone: Secondary | ICD-10-CM | POA: Insufficient documentation

## 2024-07-11 DIAGNOSIS — K5903 Drug induced constipation: Secondary | ICD-10-CM | POA: Insufficient documentation

## 2024-07-11 DIAGNOSIS — G893 Neoplasm related pain (acute) (chronic): Secondary | ICD-10-CM | POA: Insufficient documentation

## 2024-07-11 HISTORY — DX: Secondary malignant neoplasm of bone: C79.51

## 2024-07-11 HISTORY — DX: Drug induced constipation: K59.03

## 2024-07-11 HISTORY — DX: Encounter for palliative care: Z51.5

## 2024-07-21 ENCOUNTER — Other Ambulatory Visit: Payer: Self-pay | Admitting: Cardiology

## 2024-08-08 ENCOUNTER — Other Ambulatory Visit: Payer: Self-pay

## 2024-08-09 ENCOUNTER — Ambulatory Visit

## 2024-10-10 ENCOUNTER — Telehealth: Payer: Self-pay

## 2024-10-10 DIAGNOSIS — I2510 Atherosclerotic heart disease of native coronary artery without angina pectoris: Secondary | ICD-10-CM

## 2024-10-10 DIAGNOSIS — E7800 Pure hypercholesterolemia, unspecified: Secondary | ICD-10-CM

## 2024-10-10 NOTE — Telephone Encounter (Signed)
" °*  STAT* If patient is at the pharmacy, call can be transferred to refill team.   1. Which medications need to be refilled? (please list name of each medication and dose if known) Evolocumab  140 MG/ML SOAJ [[id number]]       4. Which pharmacy/location (including street and city if local pharmacy) is medication to be sent to? [facility] Phone: [phone]  Fax: [phone]       5. Do they need a 30 day or 90 day supply? 90  "

## 2024-10-11 ENCOUNTER — Other Ambulatory Visit (HOSPITAL_BASED_OUTPATIENT_CLINIC_OR_DEPARTMENT_OTHER): Payer: Self-pay

## 2024-10-12 ENCOUNTER — Other Ambulatory Visit (HOSPITAL_BASED_OUTPATIENT_CLINIC_OR_DEPARTMENT_OTHER): Payer: Self-pay

## 2024-10-12 MED ORDER — EVOLOCUMAB 140 MG/ML ~~LOC~~ SOAJ
140.0000 mg | SUBCUTANEOUS | 2 refills | Status: AC
  Filled 2024-10-12: qty 6, 84d supply, fill #0
